# Patient Record
Sex: Female | Born: 1945 | Race: Black or African American | Hispanic: No | Smoking: Former smoker
Health system: Southern US, Community
[De-identification: ages and names within clinical notes are randomized; demographics above are authoritative.]

## PROBLEM LIST (undated history)

## (undated) DIAGNOSIS — M1990 Unspecified osteoarthritis, unspecified site: Secondary | ICD-10-CM

## (undated) DIAGNOSIS — K750 Abscess of liver: Principal | ICD-10-CM

## (undated) DIAGNOSIS — G629 Polyneuropathy, unspecified: Secondary | ICD-10-CM

## (undated) DIAGNOSIS — F419 Anxiety disorder, unspecified: Secondary | ICD-10-CM

## (undated) DIAGNOSIS — C259 Malignant neoplasm of pancreas, unspecified: Secondary | ICD-10-CM

## (undated) DIAGNOSIS — T451X5A Adverse effect of antineoplastic and immunosuppressive drugs, initial encounter: Secondary | ICD-10-CM

## (undated) DIAGNOSIS — I10 Essential (primary) hypertension: Secondary | ICD-10-CM

## (undated) DIAGNOSIS — E119 Type 2 diabetes mellitus without complications: Secondary | ICD-10-CM

## (undated) DIAGNOSIS — D6481 Anemia due to antineoplastic chemotherapy: Secondary | ICD-10-CM

## (undated) HISTORY — PX: ABDOMINAL HYSTERECTOMY: SHX81

## (undated) HISTORY — DX: Adverse effect of antineoplastic and immunosuppressive drugs, initial encounter: T45.1X5A

## (undated) HISTORY — DX: Anemia due to antineoplastic chemotherapy: D64.81

## (undated) HISTORY — DX: Malignant neoplasm of pancreas, unspecified: C25.9

## (undated) HISTORY — DX: Abscess of liver: K75.0

## (undated) HISTORY — DX: Polyneuropathy, unspecified: G62.9

---

## 2004-09-13 ENCOUNTER — Ambulatory Visit (HOSPITAL_COMMUNITY): Admission: RE | Admit: 2004-09-13 | Discharge: 2004-09-13 | Payer: Self-pay | Admitting: Family Medicine

## 2004-11-27 HISTORY — PX: COLONOSCOPY: SHX174

## 2005-09-18 ENCOUNTER — Ambulatory Visit (HOSPITAL_COMMUNITY): Admission: RE | Admit: 2005-09-18 | Discharge: 2005-09-18 | Payer: Self-pay | Admitting: Family Medicine

## 2005-10-13 ENCOUNTER — Encounter: Payer: Self-pay | Admitting: Internal Medicine

## 2005-10-13 ENCOUNTER — Ambulatory Visit (HOSPITAL_COMMUNITY): Admission: RE | Admit: 2005-10-13 | Discharge: 2005-10-13 | Payer: Self-pay | Admitting: Internal Medicine

## 2005-10-13 ENCOUNTER — Ambulatory Visit: Payer: Self-pay | Admitting: Internal Medicine

## 2006-12-21 ENCOUNTER — Ambulatory Visit (HOSPITAL_COMMUNITY): Admission: RE | Admit: 2006-12-21 | Discharge: 2006-12-21 | Payer: Self-pay | Admitting: Family Medicine

## 2006-12-31 ENCOUNTER — Encounter (HOSPITAL_COMMUNITY): Admission: RE | Admit: 2006-12-31 | Discharge: 2007-01-30 | Payer: Self-pay | Admitting: Family Medicine

## 2007-03-24 ENCOUNTER — Inpatient Hospital Stay (HOSPITAL_COMMUNITY): Admission: EM | Admit: 2007-03-24 | Discharge: 2007-03-27 | Payer: Self-pay | Admitting: Emergency Medicine

## 2007-05-28 ENCOUNTER — Emergency Department (HOSPITAL_COMMUNITY): Admission: EM | Admit: 2007-05-28 | Discharge: 2007-05-28 | Payer: Self-pay | Admitting: Emergency Medicine

## 2007-06-07 ENCOUNTER — Emergency Department (HOSPITAL_COMMUNITY): Admission: EM | Admit: 2007-06-07 | Discharge: 2007-06-07 | Payer: Self-pay | Admitting: Emergency Medicine

## 2007-07-31 ENCOUNTER — Ambulatory Visit (HOSPITAL_COMMUNITY): Admission: RE | Admit: 2007-07-31 | Discharge: 2007-07-31 | Payer: Self-pay | Admitting: Family Medicine

## 2007-11-06 ENCOUNTER — Ambulatory Visit (HOSPITAL_COMMUNITY): Admission: RE | Admit: 2007-11-06 | Discharge: 2007-11-06 | Payer: Self-pay | Admitting: Family Medicine

## 2007-11-21 ENCOUNTER — Emergency Department (HOSPITAL_COMMUNITY): Admission: EM | Admit: 2007-11-21 | Discharge: 2007-11-21 | Payer: Self-pay | Admitting: Emergency Medicine

## 2008-02-06 ENCOUNTER — Ambulatory Visit (HOSPITAL_COMMUNITY): Admission: RE | Admit: 2008-02-06 | Discharge: 2008-02-06 | Payer: Self-pay | Admitting: Family Medicine

## 2008-05-11 ENCOUNTER — Ambulatory Visit (HOSPITAL_COMMUNITY): Admission: RE | Admit: 2008-05-11 | Discharge: 2008-05-11 | Payer: Self-pay | Admitting: Family Medicine

## 2008-05-25 ENCOUNTER — Encounter (INDEPENDENT_AMBULATORY_CARE_PROVIDER_SITE_OTHER): Payer: Self-pay | Admitting: General Surgery

## 2008-05-25 ENCOUNTER — Ambulatory Visit (HOSPITAL_COMMUNITY): Admission: RE | Admit: 2008-05-25 | Discharge: 2008-05-25 | Payer: Self-pay | Admitting: General Surgery

## 2008-12-02 ENCOUNTER — Ambulatory Visit (HOSPITAL_COMMUNITY): Admission: RE | Admit: 2008-12-02 | Discharge: 2008-12-02 | Payer: Self-pay | Admitting: Family Medicine

## 2008-12-09 ENCOUNTER — Ambulatory Visit (HOSPITAL_COMMUNITY): Admission: RE | Admit: 2008-12-09 | Discharge: 2008-12-09 | Payer: Self-pay | Admitting: Family Medicine

## 2009-07-15 ENCOUNTER — Ambulatory Visit (HOSPITAL_COMMUNITY): Admission: RE | Admit: 2009-07-15 | Discharge: 2009-07-15 | Payer: Self-pay | Admitting: General Surgery

## 2011-01-04 ENCOUNTER — Other Ambulatory Visit (HOSPITAL_COMMUNITY): Payer: Self-pay | Admitting: Family Medicine

## 2011-01-04 DIAGNOSIS — Z139 Encounter for screening, unspecified: Secondary | ICD-10-CM

## 2011-01-09 ENCOUNTER — Ambulatory Visit (HOSPITAL_COMMUNITY)
Admission: RE | Admit: 2011-01-09 | Discharge: 2011-01-09 | Disposition: A | Discharge status: Home / Self Care | Payer: Medicare Other | Source: Ambulatory Visit | Attending: Family Medicine | Admitting: Family Medicine

## 2011-01-09 DIAGNOSIS — Z139 Encounter for screening, unspecified: Secondary | ICD-10-CM

## 2011-01-09 DIAGNOSIS — Z1231 Encounter for screening mammogram for malignant neoplasm of breast: Secondary | ICD-10-CM | POA: Insufficient documentation

## 2011-04-11 NOTE — H&P (Signed)
NAMEJAZZMEN, [name]                ACCOUNT NO.:  [id number]   MEDICAL RECORD NO.:  [id number]         PATIENT TYPE:  PAMB   LOCATION:  DAY                           FACILITY:  [facility]   PHYSICIAN:  Dalia Heading, M.D.  DATE OF BIRTH:  1945/12/05   DATE OF ADMISSION:  DATE OF DISCHARGE:  LH                              HISTORY & PHYSICAL   CHIEF COMPLAINT:  Left nipple discharge.   HISTORY OF PRESENT ILLNESS:  The patient is a 65 year old black female  who is referred for evaluation and treatment of milky left nipple  discharge.  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  There is no family history of breast carcinoma.  She has never had a  biopsy.   PAST MEDICAL HISTORY:  Includes hypertension and non-insulin-dependent  diabetes mellitus.   PAST SURGICAL HISTORY:  Partial hysterectomy in 1989.   CURRENT MEDICATIONS:  Cozaar, simvastatin, Actos, potassium supplements,  enalapril, cholesterol pill, hydrochlorothiazide, diazepam, and baby  aspirin.   ALLERGIES:  No known drug allergies.   SOCIAL HISTORY:  The patient smokes a half pack of cigarettes per day.  She denies any alcohol use.   REVIEW OF SYSTEMS:  She denies any recent chest pain, MI, CVA, or  bleeding disorders.   PHYSICAL EXAMINATION:  GENERAL:  The patient is a well-developed, well-  nourished black female, in no acute distress.  LUNGS:  Clear to auscultation with equal breath sounds bilaterally.  HEART:  Regular, rate, and rhythm with without S3, S4, or murmurs.  BREASTS:  Left breast examination reveals a milky discharge from the  central duct.  No dominant mass or dimpling is noted.  The axilla is  negative for palpable nodes.  Right breast examination is unremarkable.   IMPRESSION:  Left nipple discharge.   PLAN:  The patient is scheduled for left breast biopsy on May 25, 2008.  The risks and benefits of the procedure including bleeding and infection  were fully explained to the patient,  gave informed consent.      Dalia Heading, M.D.  Electronically Signed     MAJ/MEDQ  D:  05/19/2008  T:  05/20/2008  Job:  [id number]

## 2011-04-11 NOTE — Op Note (Signed)
NAMEEMBERLIN, VERNER                ACCOUNT NO.:  [id number]   MEDICAL RECORD NO.:  [id number]          PATIENT TYPE:  AMB   LOCATION:  DAY                           FACILITY:  [facility]   PHYSICIAN:  Dalia Heading, M.D.  DATE OF BIRTH:  [birth year]   DATE OF PROCEDURE:  05/25/2008  DATE OF DISCHARGE:                               OPERATIVE REPORT   PREOPERATIVE DIAGNOSIS:  Left nipple discharge.   POSTOPERATIVE DIAGNOSIS:  Left nipple discharge.   PROCEDURE:  Left breast biopsy.   SURGEON:  Dalia Heading, MD   ANESTHESIA:  General   INDICATIONS:  The patient is a 65 year old black female who presents  with milky left nipple discharge.  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  Risks and benefits of the procedure including  bleeding, infection, and recurrence of the nipple discharge were fully  explained to the patient, gave informed consent.   PROCEDURE NOTE:  The patient was placed in supine position.  After  general anesthesia was administered, the left breast was prepped and  draped in the usual sterile technique with Betadine.  Surgical site  confirmation was performed.   The ductal with the discharge was probed using lacrimal duct probe.  An  infra-areolar incision was made.  The tissue posterior to the nipple  where the ductal was present, it was excised without difficulty.  It was  sent to pathology further examination.  Any bleeding was controlled  using Bovie electrocautery.  The subcutaneous layer was reapproximated  using a 4-0 Vicryl interrupted suture.  The skin was closed using a 4-0  Vicryl subcuticular suture.  A 0.5 mL Sensorcaine was instilled in the  surrounding wound.  Dermabond was then applied.   All tape and needle counts were correct at the end of the procedure.  The patient was awakened and transferred to PACU in stable condition.   COMPLICATIONS:  None.   SPECIMEN:  Left breast biopsy.   ESTIMATED BLOOD LOSS:  Minimal.      Dalia Heading, M.D.  Electronically Signed     MAJ/MEDQ  D:  05/25/2008  T:  05/25/2008  Job:  [id number]   cc:   [facility] Department

## 2011-04-14 NOTE — Group Therapy Note (Signed)
[name], [name]                ACCOUNT NO.:  [id number]   MEDICAL RECORD NO.:  [id number]          PATIENT TYPE:  INP   LOCATION:  A203                          FACILITY:  [facility]   PHYSICIAN:  Mila Homer. [name], M.D.DATE OF BIRTH:  1946/05/05   DATE OF PROCEDURE:  DATE OF DISCHARGE:                                 PROGRESS NOTE   SUBJECTIVE:  She feels better today.  Revision is improved.   OBJECTIVE:  Temperature 97.9, pulse 85, respiratory rate is  20, blood  pressure 120/91.  She is moving her on the room in no acute distress.  Well-developed and morbidly obese.  Her sensorium is normal.  Sentence  structures are intact.  There is no slurring of her speech.  She has  good strength in all arms and legs.  The heart has a regular rhythm rate  of about 80. The lungs are clear throughout, moving air well.  There is  trace edema of the ankles.  O2 sat room air.  99%.   MRI the brain yesterday showed mild chronic ischemic changes but no  infarct.  The questionable area of infarct noted on the CT of the head  the day before turned out to be an artifact.  Her urine culture is  pending.   ASSESSMENT:  1. Neurological changes secondary to hyperglycemia.  2. Uncontrolled type 2 diabetes.  3. Presumptive urinary tract infection.  4. Morbid obesity.   PLAN:  Continue with Cipro 500 mg b.i.d. and if the sugars are stable  and vision is  back to normal and we are treating the right bacterium,  We will discharge tomorrow on the same.      Mila Homer. [name], M.D.  Electronically Signed     SDK/MEDQ  D:  03/26/2007  T:  03/26/2007  Job:  [id number]

## 2011-04-14 NOTE — Discharge Summary (Signed)
[name], [name]                ACCOUNT NO.:  [id number]   MEDICAL RECORD NO.:  [id number]          PATIENT TYPE:  INP   LOCATION:  A203                          FACILITY:  [facility]   PHYSICIAN:  Mila Homer. [name], M.D.DATE OF BIRTH:  [birth year]   DATE OF ADMISSION:  03/24/2007  DATE OF DISCHARGE:  LH                               DISCHARGE SUMMARY   This 65 year old woman was admitted to the hospital with confusion. She  had a benign 3-day hospitalization between March 25, 2007 to March 27, 2007.  Vital signs remained stable.   Her admission white cell count was 7,700, H and H 15.3 and 37.7.  BMP  showed glucose of 268.  Estimated PFR was greater than 60.  UA showed  specific gravity greater than 1.030 and urine glucose greater than 1000,  it was moderate for leukocytes and there were 21-50 WBCs, 21-50 RBCs,  and many bacteria per HPF.  Urine culture at the time of discharge is  still pending.   She had a CT of the head without contrast, which showed questionable  acute or subacute infarct in the left posterior mid brain.  She then had  MR of the brain which just showed mild plaque __________ changes and no  acute abnormalities.  The lesion noted on the CT of the brain was  thought to be an artifact.   She was admitted to the hospital.  She was put on vital signs every  shift and 1800 calorie ADA diet, neuro checks every 4 hours, ASA 325 mg  daily and put on sensitive sliding scale insulin and Cipro 500 mg b.i.d.  She was also continued on Actos 45 mg daily, Januvia 100 mg daily,  Cozaar 100 mg daily, enalapril 20 mg daily, hydrochlorothiazide 50 mg  daily, and Lovenox subcutaneously.   She did well on this regimen, feeling much better except for a little  bit of visual blurring which she still had her third day but she was  ready at that point for discharge home.   DISCHARGE DIAGNOSES:  1. Presumptive urinary tract infection.  2. Neurological changes secondary to  infection.  3. Uncontrolled type 2 diabetes.  4. Obesity.  5. Benign essential hypertension.  6. Abnormal brain CT.  7. Tobacco use disorder.   She is discharged home on Januvia 100 mg daily, Actos 45 mg daily,  hydrochlorothiazide 50 mg daily, Cozaar 100 mg daily, enalapril 20 mg  daily, p.r.n. diazepam. She will also be on Cipro 500 mg b.i.d. for 10  days (20, no refills).  She is to check her sugar t.i.d.  Follow up in  the office in 2 days. She is to go back to her exercise  regimen of  daily walking, cut way back on her food as we have discussed in detail.  She is to stop cigarette smoking.      Mila Homer. [name], M.D.  Electronically Signed     SDK/MEDQ  D:  03/27/2007  T:  03/27/2007  Job:  [id number]

## 2011-04-14 NOTE — H&P (Signed)
NAMEANGENI, [name]                ACCOUNT NO.:  [id number]   MEDICAL RECORD NO.:  [id number]          PATIENT TYPE:  INP   LOCATION:  A203                          FACILITY:  [facility]   PHYSICIAN:  Mila Homer. [name], M.D.DATE OF BIRTH:  [birth year]   DATE OF ADMISSION:  03/24/2007  DATE OF DISCHARGE:  LH                              HISTORY & PHYSICAL   This 65 year old woman developed increasing weakness associated with  polyuria, nocturia, and occasional chills starting about 5 days prior to  admission.  On the day of admission, she felt like she was going to  black out, felt dizzy, and came to the ER for evaluation.   Her long-time  and current medical problems include type 2 diabetes,  benign essential hypertension, and morbid obesity.  Recently she has  been exercising more to try to combat the obesity.   CURRENT MEDICATIONS:  1. Januvia 100 mg daily.  2. Actos 45 mg daily.  3. Hydrochlorothiazide 50 mg daily.  4. Cozaar 100 mg daily.  5. Enalapril 20 mg daily.  6. Diazepam 10 mg p.r.n. which she uses rarely.   REVIEW OF SYSTEMS:  The patient had a negative Review of Systems except  she did have the polyuria and nocturia.  She denied dysuria, fever,  nausea or vomiting.  GU was reviewed and was normal as was the GI,  cardiac, pulmonary, and neurological review except for the dizziness as  mentioned above.  She had no paralysis and no lateralizing weakness  noted.   PHYSICAL EXAMINATION:  VITAL SIGNS:  Today, the temperature is 98.1,  pulse 70, respiratory rate 20, blood pressure 156/83.  GENERAL:  She is oriented and alert.  She is supine in bed.  She is  morbidly obese.  No acute distress.  Skin turgor is normal.  Mucous  membranes are moist.  She is a good historian.  Sentence structure is  intact.  HEART:  Regular rhythm with rate of 70.  LUNGS:  Clear throughout, moving air well.  ADENOPATHY:  There is no axillary or supraclavicular adenopathy.  ABDOMEN:  Soft  without hepatosplenomegaly or mass or tenderness even in  the suprapubic region.  She has no CVA or flank pain.  EXTREMITIES:  She has just trace edema of the distal legs.   Admission CBC was normal.  MET-7 showed a glucose of 268.  Admission  urine was cloudy with a specific gravity greater than 1.030 and glucose  greater than 1000.  She had moderate leukocytes and, under the  microscope, 21-50 wbc's, 21-50 rbc's and many bacteria per high-power  field.   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   ADMISSION DIAGNOSES:  1. Neurological changes of dizziness and weakness probably secondary      to urinary tract infection.  2. Poorly controlled type 2 diabetes.  3. Benign essential hypertension.  4. Urinary tract infection.  5. Morbid obesity  6. Abnormal brain CT.   PLAN:  She has already had MRI of the brain this morning, and the  results are pending.  She is on Cipro 500 mg b.i.d..  Urine C&S is  likewise pending.  We will continue her on the Januvia, Actos,  hydrochlorothiazide, Cozaar enalapril as already used along with  sensitive sliding scale insulin.  I have discussed all this with the  patient and family.      Mila Homer. [name], M.D.  Electronically Signed     SDK/MEDQ  D:  03/25/2007  T:  03/25/2007  Job:  [id number]

## 2011-04-14 NOTE — Op Note (Signed)
NAME:  [name], [name]                ACCOUNT NO.:  [id number]   MEDICAL RECORD NO.:  [id number]          PATIENT TYPE:  AMB   LOCATION:  DAY                           FACILITY:  [facility]   PHYSICIAN:  R. Roetta Sessions, M.D. DATE OF BIRTH:  1946/02/02   DATE OF PROCEDURE:  10/13/2005  DATE OF DISCHARGE:                                 OPERATIVE REPORT   PROCEDURE:  Colonoscopy with biopsy.   INDICATIONS FOR PROCEDURE:  The patient is a 65 year old lady devoid of any  lower GI tract symptoms sent over at the courtesy of Dr. [name] for  colorectal cancer screening. She has never had her lower GI tract imaged.  There is no family history of colorectal neoplasia. Colonoscopy is now being  done. This approach has been discussed with the patient at length. Potential  risks, benefits, and alternatives have been reviewed and questions answered.  She is agreeable. Please see documentation in the medical record.   PROCEDURE NOTE:  O2 saturation, blood pressure, pulse, and respirations were  monitored throughout the entire procedure. Conscious sedation with Versed 4  mg IV and Demerol 100 mg IV in divided doses.   INSTRUMENT:  Olympus video chip system.   FINDINGS:  Digital rectal exam revealed no abnormalities.   ENDOSCOPIC FINDINGS:  Prep was good.   Rectum:  Examination of the rectal mucosa including retroflexed view of the  anal verge revealed no abnormalities.   Colon:  Colonic mucosa was surveyed from the rectosigmoid junction through  the left, transverse, and right colon to the area of the appendiceal  orifice, ileocecal valve, and cecum. These structures were well seen and  photographed for the record. From this level, the scope was slowly  withdrawn, and all previously mentioned mucosal surfaces were again seen.  The colonic mucosa appeared normal aside from 3-mm polyp, and the splenic  flexure was cold biopsied/removed. The patient tolerated the procedure well  and was reactive to  endoscopy.   IMPRESSION:  Normal rectum. Diminutive polyp at the splenic flexure, cold  biopsied/removed. The remainder of the colonic mucosa appeared normal.   RECOMMENDATIONS:  1.  Follow up on pathology.  2.  Further recommendations to follow.      Jonathon Bellows, M.D.  Electronically Signed     RMR/MEDQ  D:  10/13/2005  T:  10/13/2005  Job:  [id number]   cc:   Mila Homer. [name], M.D.  Fax: [phone]

## 2011-07-03 ENCOUNTER — Other Ambulatory Visit (HOSPITAL_COMMUNITY): Payer: Self-pay | Admitting: Family Medicine

## 2011-07-03 ENCOUNTER — Ambulatory Visit (HOSPITAL_COMMUNITY)
Admission: RE | Admit: 2011-07-03 | Discharge: 2011-07-03 | Disposition: A | Discharge status: Home / Self Care | Payer: Medicare Other | Source: Ambulatory Visit | Attending: Family Medicine | Admitting: Family Medicine

## 2011-07-03 DIAGNOSIS — M25562 Pain in left knee: Secondary | ICD-10-CM

## 2011-07-03 DIAGNOSIS — IMO0002 Reserved for concepts with insufficient information to code with codable children: Secondary | ICD-10-CM | POA: Insufficient documentation

## 2011-07-03 DIAGNOSIS — M1710 Unilateral primary osteoarthritis, unspecified knee: Secondary | ICD-10-CM | POA: Insufficient documentation

## 2011-07-03 DIAGNOSIS — M25569 Pain in unspecified knee: Secondary | ICD-10-CM | POA: Insufficient documentation

## 2011-08-24 LAB — BASIC METABOLIC PANEL
BUN: 14
CO2: 29
Calcium: 9.6
Chloride: 105
Creatinine, Ser: 1.13
GFR calc Af Amer: 60 — ABNORMAL LOW
GFR calc non Af Amer: 49 — ABNORMAL LOW
Glucose, Bld: 96
Potassium: 3.6
Sodium: 141

## 2011-08-24 LAB — CBC
HCT: 36.6
Hemoglobin: 12.9
MCHC: 35.1
MCV: 87.2
Platelets: 224
RBC: 4.2
RDW: 14.9
WBC: 5.2

## 2011-09-12 LAB — BASIC METABOLIC PANEL
BUN: 12
Calcium: 9.6
Creatinine, Ser: 1.39 — ABNORMAL HIGH
GFR calc non Af Amer: 39 — ABNORMAL LOW

## 2011-09-12 LAB — URINE MICROSCOPIC-ADD ON

## 2011-09-12 LAB — CBC
Platelets: 283
WBC: 8.4

## 2011-09-12 LAB — URINALYSIS, ROUTINE W REFLEX MICROSCOPIC
Glucose, UA: NEGATIVE
Protein, ur: NEGATIVE
Specific Gravity, Urine: 1.025

## 2011-09-12 LAB — DIFFERENTIAL
Basophils Relative: 0
Lymphocytes Relative: 21
Lymphs Abs: 1.8
Neutrophils Relative %: 70

## 2012-01-08 ENCOUNTER — Other Ambulatory Visit (HOSPITAL_COMMUNITY): Payer: Self-pay | Admitting: Family Medicine

## 2012-01-08 DIAGNOSIS — Z139 Encounter for screening, unspecified: Secondary | ICD-10-CM

## 2012-01-12 ENCOUNTER — Ambulatory Visit (HOSPITAL_COMMUNITY)
Admission: RE | Admit: 2012-01-12 | Discharge: 2012-01-12 | Disposition: A | Discharge status: Home / Self Care | Payer: Medicare Other | Source: Ambulatory Visit | Attending: Family Medicine | Admitting: Family Medicine

## 2012-01-12 DIAGNOSIS — Z1231 Encounter for screening mammogram for malignant neoplasm of breast: Secondary | ICD-10-CM | POA: Insufficient documentation

## 2012-01-12 DIAGNOSIS — Z139 Encounter for screening, unspecified: Secondary | ICD-10-CM

## 2012-12-10 ENCOUNTER — Other Ambulatory Visit (HOSPITAL_COMMUNITY): Payer: Self-pay | Admitting: Family Medicine

## 2012-12-10 DIAGNOSIS — Z139 Encounter for screening, unspecified: Secondary | ICD-10-CM

## 2013-01-13 ENCOUNTER — Ambulatory Visit (HOSPITAL_COMMUNITY)
Admission: RE | Admit: 2013-01-13 | Discharge: 2013-01-13 | Disposition: A | Discharge status: Home / Self Care | Payer: Medicare Other | Source: Ambulatory Visit | Attending: Family Medicine | Admitting: Family Medicine

## 2013-01-13 DIAGNOSIS — Z1231 Encounter for screening mammogram for malignant neoplasm of breast: Secondary | ICD-10-CM | POA: Insufficient documentation

## 2013-01-13 DIAGNOSIS — Z139 Encounter for screening, unspecified: Secondary | ICD-10-CM

## 2013-05-03 ENCOUNTER — Emergency Department (HOSPITAL_COMMUNITY)
Admission: EM | Admit: 2013-05-03 | Discharge: 2013-05-04 | Disposition: A | Discharge status: Home / Self Care | Payer: Medicare Other | Attending: Emergency Medicine | Admitting: Emergency Medicine

## 2013-05-03 ENCOUNTER — Encounter (HOSPITAL_COMMUNITY): Payer: Self-pay | Admitting: *Deleted

## 2013-05-03 DIAGNOSIS — R739 Hyperglycemia, unspecified: Secondary | ICD-10-CM

## 2013-05-03 DIAGNOSIS — R109 Unspecified abdominal pain: Secondary | ICD-10-CM

## 2013-05-03 DIAGNOSIS — R5381 Other malaise: Secondary | ICD-10-CM | POA: Insufficient documentation

## 2013-05-03 DIAGNOSIS — M129 Arthropathy, unspecified: Secondary | ICD-10-CM | POA: Insufficient documentation

## 2013-05-03 DIAGNOSIS — I10 Essential (primary) hypertension: Secondary | ICD-10-CM | POA: Insufficient documentation

## 2013-05-03 DIAGNOSIS — Z90710 Acquired absence of both cervix and uterus: Secondary | ICD-10-CM | POA: Insufficient documentation

## 2013-05-03 DIAGNOSIS — E1169 Type 2 diabetes mellitus with other specified complication: Secondary | ICD-10-CM | POA: Insufficient documentation

## 2013-05-03 DIAGNOSIS — N390 Urinary tract infection, site not specified: Secondary | ICD-10-CM | POA: Insufficient documentation

## 2013-05-03 DIAGNOSIS — R358 Other polyuria: Secondary | ICD-10-CM | POA: Insufficient documentation

## 2013-05-03 DIAGNOSIS — Z79899 Other long term (current) drug therapy: Secondary | ICD-10-CM | POA: Insufficient documentation

## 2013-05-03 DIAGNOSIS — R350 Frequency of micturition: Secondary | ICD-10-CM | POA: Insufficient documentation

## 2013-05-03 DIAGNOSIS — R197 Diarrhea, unspecified: Secondary | ICD-10-CM

## 2013-05-03 DIAGNOSIS — R300 Dysuria: Secondary | ICD-10-CM | POA: Insufficient documentation

## 2013-05-03 DIAGNOSIS — R5383 Other fatigue: Secondary | ICD-10-CM | POA: Insufficient documentation

## 2013-05-03 DIAGNOSIS — Z7982 Long term (current) use of aspirin: Secondary | ICD-10-CM | POA: Insufficient documentation

## 2013-05-03 DIAGNOSIS — F17200 Nicotine dependence, unspecified, uncomplicated: Secondary | ICD-10-CM | POA: Insufficient documentation

## 2013-05-03 DIAGNOSIS — F411 Generalized anxiety disorder: Secondary | ICD-10-CM | POA: Insufficient documentation

## 2013-05-03 DIAGNOSIS — R3589 Other polyuria: Secondary | ICD-10-CM | POA: Insufficient documentation

## 2013-05-03 HISTORY — DX: Type 2 diabetes mellitus without complications: E11.9

## 2013-05-03 HISTORY — DX: Unspecified osteoarthritis, unspecified site: M19.90

## 2013-05-03 HISTORY — DX: Anxiety disorder, unspecified: F41.9

## 2013-05-03 HISTORY — DX: Essential (primary) hypertension: I10

## 2013-05-03 NOTE — ED Notes (Signed)
Pt reporting pain in lower abdomen.  Reporting urinary urgency, frequency and burning.  Believes she has a UTI.  Reporting symptoms began on Monday.

## 2013-05-04 LAB — URINALYSIS, ROUTINE W REFLEX MICROSCOPIC
Ketones, ur: NEGATIVE mg/dL
Specific Gravity, Urine: 1.01 (ref 1.005–1.030)
Urobilinogen, UA: 0.2 mg/dL (ref 0.0–1.0)

## 2013-05-04 LAB — POCT I-STAT, CHEM 8
Chloride: 96 mEq/L (ref 96–112)
Creatinine, Ser: 1.2 mg/dL — ABNORMAL HIGH (ref 0.50–1.10)
HCT: 40 % (ref 36.0–46.0)
Hemoglobin: 13.6 g/dL (ref 12.0–15.0)
Potassium: 3.3 mEq/L — ABNORMAL LOW (ref 3.5–5.1)
Sodium: 136 mEq/L (ref 135–145)

## 2013-05-04 LAB — URINE MICROSCOPIC-ADD ON

## 2013-05-04 MED ORDER — OXYCODONE-ACETAMINOPHEN 5-325 MG PO TABS: 1.0000 | ORAL_TABLET | ORAL | Status: DC | PRN

## 2013-05-04 MED ORDER — METFORMIN HCL 500 MG PO TABS: 500.0000 mg | ORAL_TABLET | Freq: Every day | ORAL | Status: DC

## 2013-05-04 MED ORDER — OXYCODONE-ACETAMINOPHEN 5-325 MG PO TABS
1.0000 | ORAL_TABLET | Freq: Once | ORAL | Status: AC
  Administered 2013-05-04: 1 via ORAL
  Filled 2013-05-04 (×2): qty 1

## 2013-05-04 NOTE — ED Provider Notes (Signed)
History     CSN: [id number]  Arrival date & time 05/03/13  2313   First MD Initiated Contact with Patient 05/03/13 2357      Chief Complaint  Patient presents with  . Abdominal Pain  . Urinary Tract Infection     Patient is a 67 y.o. female presenting with abdominal pain. The history is provided by the patient.  Abdominal Pain This is a new problem. The current episode started more than 2 days ago. The problem occurs daily. The problem has not changed since onset.Associated symptoms include abdominal pain. Pertinent negatives include no chest pain, no headaches and no shortness of breath. Nothing aggravates the symptoms. Nothing relieves the symptoms. The treatment provided mild relief.  pt reports she has had mild lower abdominal pain for up to 5 days She reports loose nonbloody diarrhea No vomiting After several episodes of the diarrhea she noticed mild dysuria.  She also reports recent urinary frequency She thinks she has UTI She reports mild fatigue but no cp/sob.  No focal weakness No fever reported  Past Medical History  Diagnosis Date  . Diabetes mellitus without complication   . Hypertension   . Anxiety   . Arthritis     Past Surgical History  Procedure Laterality Date  . Abdominal hysterectomy      History reviewed. No pertinent family history.  History  Substance Use Topics  . Smoking status: Current Every Day Smoker -- 1.00 packs/day  . Smokeless tobacco: Not on file  . Alcohol Use: No    OB History   Grav Para Term Preterm Abortions TAB SAB Ect Mult Living                  Review of Systems  Constitutional: Positive for fatigue.  Respiratory: Negative for shortness of breath.   Cardiovascular: Negative for chest pain.  Gastrointestinal: Positive for abdominal pain.  Endocrine: Positive for polyuria.  Genitourinary: Positive for dysuria.  Neurological: Negative for headaches.  All other systems reviewed and are negative.    Allergies   Review of patient's allergies indicates no known allergies.  Home Medications   Current Outpatient Rx  Name  Route  Sig  Dispense  Refill  . ALPRAZolam (XANAX) 1 MG tablet   Oral   Take 1 mg by mouth 3 (three) times daily as needed for sleep or anxiety.         Marland Kitchen amLODipine (NORVASC) 2.5 MG tablet   Oral   Take 2.5 mg by mouth daily.         Marland Kitchen aspirin 81 MG tablet   Oral   Take 81 mg by mouth daily.         . hydrochlorothiazide (HYDRODIURIL) 25 MG tablet   Oral   Take 25 mg by mouth daily.         Marland Kitchen losartan (COZAAR) 100 MG tablet   Oral   Take 100 mg by mouth daily.         . potassium chloride (K-DUR,KLOR-CON) 10 MEQ tablet   Oral   Take 10 mEq by mouth 2 (two) times daily.         . simvastatin (ZOCOR) 20 MG tablet   Oral   Take 20 mg by mouth every evening.         . metFORMIN (GLUCOPHAGE) 500 MG tablet   Oral   Take 1 tablet (500 mg total) by mouth daily with breakfast.   14 tablet   0  BP 158/86  Pulse 96  Temp(Src) 99 F (37.2 C) (Oral)  Resp 18  Ht 5\' 6"  (1.676 m)  Wt 162 lb (73.483 kg)  BMI 26.16 kg/m2  SpO2 99%  Physical Exam CONSTITUTIONAL: Well developed/well nourished HEAD: Normocephalic/atraumatic EYES: EOMI/PERRL ENMT: Mucous membranes moist NECK: supple no meningeal signs SPINE:entire spine nontender CV: S1/S2 noted, no murmurs/rubs/gallops noted LUNGS: Lungs are clear to auscultation bilaterally, no apparent distress ABDOMEN: soft, nontender, no rebound or guarding GU:no cva tenderness NEURO: Pt is awake/alert, moves all extremitiesx4 EXTREMITIES: pulses normal, full ROM SKIN: warm, color normal PSYCH: no abnormalities of mood noted  ED Course  Procedures   Labs Reviewed  URINALYSIS, ROUTINE W REFLEX MICROSCOPIC - Abnormal; Notable for the following:    Glucose, UA >1000 (*)    Hgb urine dipstick TRACE (*)    Bilirubin Urine MODERATE (*)    All other components within normal limits  URINE MICROSCOPIC-ADD  ON - Abnormal; Notable for the following:    Squamous Epithelial / LPF FEW (*)    All other components within normal limits  POCT I-STAT, CHEM 8 - Abnormal; Notable for the following:    Potassium 3.3 (*)    Creatinine, Ser 1.20 (*)    Glucose, Bld 394 (*)    Calcium, Ion 1.11 (*)    All other components within normal limits   No results found.   1. Diarrhea   2. Abdominal pain   3. Hyperglycemia    Pt well appearing, no distress, watching TV Suspect her hyperglycemia has led to her polyuria.  She reports she is no longer on diabetic meds as her PCP took her off the meds.   No uti noted.  Mild hypoK noted but she is on potassium replacement for her HCTZ Her abdominal exam is benign.  I doubt acute abdominal process/diverticulitis/appendicitis at this time She reports she was taken off metformin just this past winter.  I will restart this and she reports she has tolerated this well She has no signs of DKA  Discussed need for outpatient followup  MDM  Nursing notes including past medical history and social history reviewed and considered in documentation Labs/vital reviewed and considered         Joya Gaskins, MD 05/04/13 [phone]

## 2013-05-07 ENCOUNTER — Emergency Department (HOSPITAL_COMMUNITY)
Admission: EM | Admit: 2013-05-07 | Discharge: 2013-05-07 | Discharge status: Against Medical Advice | Payer: Medicare Other | Source: Home / Self Care | Attending: Emergency Medicine | Admitting: Emergency Medicine

## 2013-05-07 ENCOUNTER — Encounter (HOSPITAL_COMMUNITY): Payer: Self-pay | Admitting: Emergency Medicine

## 2013-05-07 ENCOUNTER — Emergency Department (HOSPITAL_COMMUNITY): Payer: Medicare Other

## 2013-05-07 ENCOUNTER — Inpatient Hospital Stay (HOSPITAL_COMMUNITY)
Admission: EM | Admit: 2013-05-07 | Discharge: 2013-05-13 | DRG: 435 | Disposition: A | Discharge status: Home / Self Care | Payer: Medicare Other | Attending: Family Medicine | Admitting: Family Medicine

## 2013-05-07 DIAGNOSIS — E119 Type 2 diabetes mellitus without complications: Secondary | ICD-10-CM | POA: Diagnosis present

## 2013-05-07 DIAGNOSIS — C7800 Secondary malignant neoplasm of unspecified lung: Secondary | ICD-10-CM | POA: Diagnosis present

## 2013-05-07 DIAGNOSIS — K831 Obstruction of bile duct: Secondary | ICD-10-CM | POA: Diagnosis present

## 2013-05-07 DIAGNOSIS — L299 Pruritus, unspecified: Secondary | ICD-10-CM | POA: Insufficient documentation

## 2013-05-07 DIAGNOSIS — R109 Unspecified abdominal pain: Secondary | ICD-10-CM | POA: Insufficient documentation

## 2013-05-07 DIAGNOSIS — F411 Generalized anxiety disorder: Secondary | ICD-10-CM | POA: Diagnosis present

## 2013-05-07 DIAGNOSIS — H9319 Tinnitus, unspecified ear: Secondary | ICD-10-CM | POA: Insufficient documentation

## 2013-05-07 DIAGNOSIS — F17200 Nicotine dependence, unspecified, uncomplicated: Secondary | ICD-10-CM | POA: Insufficient documentation

## 2013-05-07 DIAGNOSIS — Z90710 Acquired absence of both cervix and uterus: Secondary | ICD-10-CM | POA: Insufficient documentation

## 2013-05-07 DIAGNOSIS — R822 Biliuria: Secondary | ICD-10-CM | POA: Insufficient documentation

## 2013-05-07 DIAGNOSIS — M129 Arthropathy, unspecified: Secondary | ICD-10-CM | POA: Insufficient documentation

## 2013-05-07 DIAGNOSIS — R17 Unspecified jaundice: Secondary | ICD-10-CM

## 2013-05-07 DIAGNOSIS — I10 Essential (primary) hypertension: Secondary | ICD-10-CM | POA: Insufficient documentation

## 2013-05-07 DIAGNOSIS — C786 Secondary malignant neoplasm of retroperitoneum and peritoneum: Secondary | ICD-10-CM | POA: Diagnosis present

## 2013-05-07 DIAGNOSIS — R300 Dysuria: Secondary | ICD-10-CM | POA: Insufficient documentation

## 2013-05-07 DIAGNOSIS — E876 Hypokalemia: Secondary | ICD-10-CM | POA: Diagnosis present

## 2013-05-07 DIAGNOSIS — C787 Secondary malignant neoplasm of liver and intrahepatic bile duct: Secondary | ICD-10-CM | POA: Diagnosis present

## 2013-05-07 DIAGNOSIS — C259 Malignant neoplasm of pancreas, unspecified: Principal | ICD-10-CM | POA: Diagnosis present

## 2013-05-07 DIAGNOSIS — E860 Dehydration: Secondary | ICD-10-CM | POA: Diagnosis present

## 2013-05-07 DIAGNOSIS — R630 Anorexia: Secondary | ICD-10-CM | POA: Insufficient documentation

## 2013-05-07 DIAGNOSIS — K838 Other specified diseases of biliary tract: Secondary | ICD-10-CM

## 2013-05-07 DIAGNOSIS — Z79899 Other long term (current) drug therapy: Secondary | ICD-10-CM | POA: Insufficient documentation

## 2013-05-07 DIAGNOSIS — Z720 Tobacco use: Secondary | ICD-10-CM | POA: Diagnosis present

## 2013-05-07 DIAGNOSIS — Z7982 Long term (current) use of aspirin: Secondary | ICD-10-CM | POA: Insufficient documentation

## 2013-05-07 DIAGNOSIS — R21 Rash and other nonspecific skin eruption: Secondary | ICD-10-CM | POA: Insufficient documentation

## 2013-05-07 DIAGNOSIS — Z808 Family history of malignant neoplasm of other organs or systems: Secondary | ICD-10-CM

## 2013-05-07 DIAGNOSIS — Z8249 Family history of ischemic heart disease and other diseases of the circulatory system: Secondary | ICD-10-CM

## 2013-05-07 DIAGNOSIS — R180 Malignant ascites: Secondary | ICD-10-CM | POA: Diagnosis present

## 2013-05-07 DIAGNOSIS — E1169 Type 2 diabetes mellitus with other specified complication: Secondary | ICD-10-CM | POA: Insufficient documentation

## 2013-05-07 LAB — URINALYSIS, ROUTINE W REFLEX MICROSCOPIC
Nitrite: NEGATIVE
Nitrite: NEGATIVE
Protein, ur: NEGATIVE mg/dL
Specific Gravity, Urine: 1.02 (ref 1.005–1.030)
Specific Gravity, Urine: 1.01 (ref 1.005–1.030)
Urobilinogen, UA: 0.2 mg/dL (ref 0.0–1.0)
Urobilinogen, UA: 0.2 mg/dL (ref 0.0–1.0)

## 2013-05-07 LAB — CBC WITH DIFFERENTIAL/PLATELET
Lymphocytes Relative: 15 % (ref 12–46)
Lymphs Abs: 1.6 10*3/uL (ref 0.7–4.0)
Neutro Abs: 8.4 10*3/uL — ABNORMAL HIGH (ref 1.7–7.7)
Neutrophils Relative %: 74 % (ref 43–77)
Platelets: 242 10*3/uL (ref 150–400)
RBC: 3.82 MIL/uL — ABNORMAL LOW (ref 3.87–5.11)
WBC: 11.3 10*3/uL — ABNORMAL HIGH (ref 4.0–10.5)

## 2013-05-07 LAB — COMPREHENSIVE METABOLIC PANEL
ALT: 123 U/L — ABNORMAL HIGH (ref 0–35)
Alkaline Phosphatase: 429 U/L — ABNORMAL HIGH (ref 39–117)
CO2: 25 mEq/L (ref 19–32)
Chloride: 95 mEq/L — ABNORMAL LOW (ref 96–112)
GFR calc Af Amer: 82 mL/min — ABNORMAL LOW (ref >90)
GFR calc non Af Amer: 71 mL/min — ABNORMAL LOW (ref >90)
Glucose, Bld: 356 mg/dL — ABNORMAL HIGH (ref 70–99)
Potassium: 3.2 mEq/L — ABNORMAL LOW (ref 3.5–5.1)
Sodium: 133 mEq/L — ABNORMAL LOW (ref 135–145)
Total Protein: 7.3 g/dL (ref 6.0–8.3)

## 2013-05-07 LAB — URINE MICROSCOPIC-ADD ON

## 2013-05-07 LAB — GLUCOSE, CAPILLARY: Glucose-Capillary: 311 mg/dL — ABNORMAL HIGH (ref 70–99)

## 2013-05-07 MED ORDER — ASPIRIN 81 MG PO CHEW
81.0000 mg | CHEWABLE_TABLET | Freq: Every day | ORAL | Status: DC
  Administered 2013-05-08 – 2013-05-09 (×2): 81 mg via ORAL
  Filled 2013-05-07 (×2): qty 1

## 2013-05-07 MED ORDER — HYDROCODONE-ACETAMINOPHEN 5-325 MG PO TABS
1.0000 | ORAL_TABLET | ORAL | Status: DC | PRN
Start: 2013-05-07 — End: 2013-05-13

## 2013-05-07 MED ORDER — ONDANSETRON HCL 4 MG/2ML IJ SOLN: 4.0000 mg | Freq: Four times a day (QID) | INTRAMUSCULAR | Status: DC | PRN

## 2013-05-07 MED ORDER — SIMVASTATIN 20 MG PO TABS
20.0000 mg | ORAL_TABLET | Freq: Every day | ORAL | Status: DC
  Administered 2013-05-07 – 2013-05-09 (×3): 20 mg via ORAL
  Filled 2013-05-07 (×3): qty 1

## 2013-05-07 MED ORDER — ENOXAPARIN SODIUM 40 MG/0.4ML ~~LOC~~ SOLN
40.0000 mg | SUBCUTANEOUS | Status: DC
  Administered 2013-05-07 – 2013-05-08 (×2): 40 mg via SUBCUTANEOUS
  Filled 2013-05-07 (×2): qty 0.4

## 2013-05-07 MED ORDER — POTASSIUM CHLORIDE IN NACL 20-0.9 MEQ/L-% IV SOLN
INTRAVENOUS | Status: DC
  Administered 2013-05-07 – 2013-05-13 (×10): via INTRAVENOUS

## 2013-05-07 MED ORDER — IOHEXOL 300 MG/ML  SOLN
50.0000 mL | Freq: Once | INTRAMUSCULAR | Status: AC | PRN
  Administered 2013-05-07: 50 mL via ORAL

## 2013-05-07 MED ORDER — ONDANSETRON HCL 4 MG PO TABS: 4.0000 mg | ORAL_TABLET | Freq: Four times a day (QID) | ORAL | Status: DC | PRN

## 2013-05-07 MED ORDER — MORPHINE SULFATE 2 MG/ML IJ SOLN: 2.0000 mg | INTRAMUSCULAR | Status: DC | PRN

## 2013-05-07 MED ORDER — DEXTROSE 5 % IV SOLN
INTRAVENOUS | Status: AC
  Filled 2013-05-07: qty 10

## 2013-05-07 MED ORDER — IOHEXOL 300 MG/ML  SOLN
100.0000 mL | Freq: Once | INTRAMUSCULAR | Status: AC | PRN
  Administered 2013-05-07: 100 mL via INTRAVENOUS

## 2013-05-07 MED ORDER — ACETAMINOPHEN 325 MG PO TABS: 650.0000 mg | ORAL_TABLET | Freq: Four times a day (QID) | ORAL | Status: DC | PRN

## 2013-05-07 MED ORDER — DEXTROSE 5 % IV SOLN
1.0000 g | INTRAVENOUS | Status: DC
  Administered 2013-05-07 – 2013-05-12 (×6): 1 g via INTRAVENOUS
  Filled 2013-05-07 (×6): qty 10

## 2013-05-07 MED ORDER — ALPRAZOLAM 1 MG PO TABS
1.0000 mg | ORAL_TABLET | Freq: Three times a day (TID) | ORAL | Status: DC | PRN
  Administered 2013-05-08 – 2013-05-12 (×4): 1 mg via ORAL
  Filled 2013-05-07 (×4): qty 1

## 2013-05-07 MED ORDER — SODIUM CHLORIDE 0.9 % IV BOLUS (SEPSIS)
1000.0000 mL | Freq: Once | INTRAVENOUS | Status: AC
  Administered 2013-05-07: 1000 mL via INTRAVENOUS

## 2013-05-07 MED ORDER — INSULIN ASPART 100 UNIT/ML ~~LOC~~ SOLN
0.0000 [IU] | Freq: Every day | SUBCUTANEOUS | Status: DC
  Administered 2013-05-07: 4 [IU] via SUBCUTANEOUS
  Administered 2013-05-08 – 2013-05-12 (×3): 2 [IU] via SUBCUTANEOUS

## 2013-05-07 MED ORDER — SODIUM CHLORIDE 0.9 % IJ SOLN
3.0000 mL | Freq: Two times a day (BID) | INTRAMUSCULAR | Status: DC
  Administered 2013-05-08 – 2013-05-12 (×2): 3 mL via INTRAVENOUS

## 2013-05-07 MED ORDER — AMLODIPINE BESYLATE 5 MG PO TABS
2.5000 mg | ORAL_TABLET | Freq: Every day | ORAL | Status: DC
  Administered 2013-05-08 – 2013-05-13 (×6): 2.5 mg via ORAL
  Filled 2013-05-07 (×6): qty 1

## 2013-05-07 MED ORDER — ACETAMINOPHEN 650 MG RE SUPP: 650.0000 mg | Freq: Four times a day (QID) | RECTAL | Status: DC | PRN

## 2013-05-07 MED ORDER — INSULIN ASPART 100 UNIT/ML ~~LOC~~ SOLN
0.0000 [IU] | Freq: Three times a day (TID) | SUBCUTANEOUS | Status: DC
  Administered 2013-05-08: 5 [IU] via SUBCUTANEOUS
  Administered 2013-05-08 (×2): 3 [IU] via SUBCUTANEOUS
  Administered 2013-05-09: 8 [IU] via SUBCUTANEOUS
  Administered 2013-05-09: 3 [IU] via SUBCUTANEOUS
  Administered 2013-05-09: 5 [IU] via SUBCUTANEOUS
  Administered 2013-05-10 (×2): 8 [IU] via SUBCUTANEOUS
  Administered 2013-05-11 – 2013-05-12 (×5): 3 [IU] via SUBCUTANEOUS
  Administered 2013-05-13: 8 [IU] via SUBCUTANEOUS

## 2013-05-07 MED ORDER — LOSARTAN POTASSIUM 50 MG PO TABS
100.0000 mg | ORAL_TABLET | Freq: Every day | ORAL | Status: DC
  Administered 2013-05-08 – 2013-05-13 (×6): 100 mg via ORAL
  Filled 2013-05-07 (×6): qty 2

## 2013-05-07 NOTE — ED Notes (Addendum)
Pt seen in ED earlier today for abd pain and diarrhea. Pt left AMA but agreed to return for admission.

## 2013-05-07 NOTE — ED Provider Notes (Signed)
History  This chart was scribed for Autumn Lennert, MD by [name], ED Scribe. This patient was seen in room [facility]03[facility]03 and the patient's care was started at 12:17 PM.  CSN: [id number]  Arrival date & time 05/07/13  1133   First MD Initiated Contact with Patient 05/07/13 1217      Chief Complaint  Patient presents with  . Fatigue  . Urinary Tract Infection     Patient is a 67 y.o. female presenting with weakness. The history is provided by the patient. No language interpreter was used.  Weakness This is a new problem. The current episode started more than 1 week ago. The problem occurs constantly. The problem has been gradually worsening. Associated symptoms include abdominal pain. Pertinent negatives include no chest pain, no headaches and no shortness of breath. Nothing aggravates the symptoms. Nothing relieves the symptoms. She has tried nothing for the symptoms.    HPI Comments: [name] is a 67 y.o. female with a h/o DM who presents to the Emergency Department complaining of 9 days of gradual onset, gradually worsening, constant generalized weakness with associated decreased appetite, dysuria, urine color change to orange, mild lower abdominal pain and generalized itching on back and abdomen. She was seen in the ED for the same on 05/03/13 (4 days ago) and had a negative work-up for an UTI and a negative CT scan of the abdomen. Her symptoms at that time were attributed to hyperglycemia and she was restarted on metformin which she had stopped in December 2013. No other medications were changed or prescribed. She also c/o right tinnitus but denies emesis, diarrhea and CP as associated symptoms. She has a h/o HTN and anxiety. Pt is a current everyday smoker but denies alcohol use.   Past Medical History  Diagnosis Date  . Diabetes mellitus without complication   . Hypertension   . Anxiety   . Arthritis     Past Surgical History  Procedure Laterality Date  . Abdominal  hysterectomy      No family history on file.  History  Substance Use Topics  . Smoking status: Current Every Day Smoker -- 1.00 packs/day  . Smokeless tobacco: Not on file  . Alcohol Use: No    No OB history provided.  Review of Systems  Constitutional: Positive for appetite change (decrease). Negative for chills and fatigue.  HENT: Positive for tinnitus. Negative for congestion, sinus pressure and ear discharge.   Eyes: Negative for discharge.  Respiratory: Negative for cough and shortness of breath.   Cardiovascular: Negative for chest pain.  Gastrointestinal: Positive for abdominal pain. Negative for nausea, vomiting and diarrhea.  Genitourinary: Positive for dysuria. Negative for frequency and hematuria.  Musculoskeletal: Negative for back pain.  Skin: Positive for rash.  Neurological: Positive for weakness. Negative for seizures and headaches.  Psychiatric/Behavioral: Negative for hallucinations.    Allergies  Review of patient's allergies indicates no known allergies.  Home Medications   Current Outpatient Rx  Name  Route  Sig  Dispense  Refill  . ALPRAZolam (XANAX) 1 MG tablet   Oral   Take 1 mg by mouth 3 (three) times daily as needed for sleep or anxiety.         Marland Kitchen amLODipine (NORVASC) 2.5 MG tablet   Oral   Take 2.5 mg by mouth daily.         Marland Kitchen aspirin 81 MG tablet   Oral   Take 81 mg by mouth daily.         Marland Kitchen  hydrochlorothiazide (HYDRODIURIL) 25 MG tablet   Oral   Take 25 mg by mouth daily.         Marland Kitchen losartan (COZAAR) 100 MG tablet   Oral   Take 100 mg by mouth daily.         . metFORMIN (GLUCOPHAGE) 500 MG tablet   Oral   Take 1 tablet (500 mg total) by mouth daily with breakfast.   14 tablet   0   . potassium chloride (K-DUR,KLOR-CON) 10 MEQ tablet   Oral   Take 10 mEq by mouth 2 (two) times daily.         . simvastatin (ZOCOR) 20 MG tablet   Oral   Take 20 mg by mouth every evening.           Triage Vitals: BP 169/96   Pulse 98  Temp(Src) 98.3 F (36.8 C) (Oral)  Resp 18  Ht 5\' 5"  (1.651 m)  Wt 162 lb (73.483 kg)  BMI 26.96 kg/m2  SpO2 96%  Physical Exam  Nursing note and vitals reviewed. Constitutional: She is oriented to person, place, and time. She appears well-developed and well-nourished.  HENT:  Head: Normocephalic and atraumatic.  Eyes: Conjunctivae and EOM are normal. Scleral icterus is present.  Neck: Neck supple. No thyromegaly present.  Cardiovascular: Normal rate and regular rhythm.  Exam reveals no gallop and no friction rub.   No murmur heard. Pulmonary/Chest: Effort normal and breath sounds normal. No stridor. She has no wheezes. She has no rales. She exhibits no tenderness.  Abdominal: She exhibits no distension. There is no tenderness. There is no rebound.  Musculoskeletal: Normal range of motion. She exhibits no edema.  Lymphadenopathy:    She has no cervical adenopathy.  Neurological: She is alert and oriented to person, place, and time. Coordination normal.  Skin: No rash noted. No erythema.  Fine rash to right upper back  Psychiatric: She has a normal mood and affect. Her behavior is normal.    ED Course  Procedures (including critical care time)  Medications  sodium chloride 0.9 % bolus 1,000 mL (not administered)    DIAGNOSTIC STUDIES: Oxygen Saturation is 96% on room air, normal by my interpretation.    COORDINATION OF CARE: 12:21 PM-Discussed treatment plan which includes IV fluids, CBC panel, CMP and UA with pt at bedside and pt agreed to plan.   2:07 PM-Pt rechecked and still complains of diffuse abdominal pain with medications listed above. Pt reiterates that she began feeling an "acidy" feeling diffusely in her abdomen 1.5 weeks ago. She reports intermittent nausea but denies emesis. Informed pt of lab results showing elevated liver enzymes. Discussed plan to order CT of abdomen with pt and pt agreed.  Labs Reviewed  URINALYSIS, ROUTINE W REFLEX MICROSCOPIC  - Abnormal; Notable for the following:    Color, Urine AMBER (*)    APPearance HAZY (*)    Glucose, UA >1000 (*)    Hgb urine dipstick TRACE (*)    Bilirubin Urine LARGE (*)    Protein, ur TRACE (*)    All other components within normal limits  CBC WITH DIFFERENTIAL - Abnormal; Notable for the following:    WBC 11.3 (*)    RBC 3.82 (*)    Hemoglobin 11.5 (*)    HCT 32.4 (*)    Neutro Abs 8.4 (*)    All other components within normal limits  COMPREHENSIVE METABOLIC PANEL - Abnormal; Notable for the following:    Sodium 133 (*)  Potassium 3.2 (*)    Chloride 95 (*)    Glucose, Bld 356 (*)    Albumin 2.8 (*)    AST 92 (*)    ALT 123 (*)    Alkaline Phosphatase 429 (*)    Total Bilirubin 14.6 (*)    All other components within normal limits  URINE MICROSCOPIC-ADD ON - Abnormal; Notable for the following:    Squamous Epithelial / LPF MANY (*)    Bacteria, UA MANY (*)    All other components within normal limits   No results found.   No diagnosis found.    MDM  Pt with stage 4 pancreatic ca.  She is going home for one hour and returning to er for admission .The chart was scribed for me under my direct supervision.  I personally performed the history, physical, and medical decision making and all procedures in the evaluation of this patient.Autumn Lennert, MD 05/07/13 1550

## 2013-05-07 NOTE — ED Notes (Signed)
Pt refusing to stay for admission right now.  Pt states that she has to go home.  Pt agreed with all risk by leaving.  edp aware.

## 2013-05-07 NOTE — ED Notes (Signed)
MD at bedside. 

## 2013-05-07 NOTE — ED Provider Notes (Signed)
History     CSN: [id number]  Arrival date & time 05/07/13  1659   First MD Initiated Contact with Patient 05/07/13 1708      Chief Complaint  Patient presents with  . Abdominal Pain     HPI Pt was seen at 1735.   Per pt, c/o gradual onset and persistence of constant generalized abd "pain" for the past 1 to 2 weeks. Has been associated with generalized weakness/fatigue, "dark urine" and "light stools."  Pt states she was having "loose BM's" but not diarrhea. States she was eval in the ED 4 days ago for same, dx diarrhea and hyperglycemia. States since then she has noticed she has been "itching all over" and "my eyes are turning yellow." Denies CP/SOB, no cough, no back pain, no N/V/D, no fevers, no rash. Pt was evaluated in the ED today for these symptoms and left AMA. States she returned now "to get admitted."    Past Medical History  Diagnosis Date  . Diabetes mellitus without complication   . Hypertension   . Anxiety   . Arthritis     Past Surgical History  Procedure Laterality Date  . Abdominal hysterectomy        History  Substance Use Topics  . Smoking status: Current Every Day Smoker -- 1.00 packs/day  . Smokeless tobacco: Not on file  . Alcohol Use: No      Review of Systems ROS: Statement: All systems negative except as marked or noted in the HPI; Constitutional: Negative for fever and chills. +generalized weakness/fatigue. ; ; Eyes: Negative for eye pain, redness and discharge. ; ; ENMT: Negative for ear pain, hoarseness, nasal congestion, sinus pressure and sore throat. ; ; Cardiovascular: Negative for chest pain, palpitations, diaphoresis, dyspnea and peripheral edema. ; ; Respiratory: Negative for cough, wheezing and stridor. ; ; Gastrointestinal: +abd pain, "light colored stools." Negative for nausea, vomiting, blood in stool, hematemesis, jaundice and rectal bleeding. . ; ; Genitourinary: +"dark urine." Negative for dysuria, flank pain and hematuria. ; ;  Musculoskeletal: Negative for back pain and neck pain. Negative for swelling and trauma.; ; Skin: +generalized itching. Negative for rash, abrasions, blisters, bruising and skin lesion.; ; Neuro: Negative for headache, lightheadedness and neck stiffness. Negative for weakness, altered level of consciousness , altered mental status, extremity weakness, paresthesias, involuntary movement, seizure and syncope.       Allergies  Review of patient's allergies indicates no known allergies.  Home Medications   Current Outpatient Rx  Name  Route  Sig  Dispense  Refill  . ALPRAZolam (XANAX) 1 MG tablet   Oral   Take 1 mg by mouth 3 (three) times daily as needed for sleep or anxiety.         Marland Kitchen amLODipine (NORVASC) 2.5 MG tablet   Oral   Take 2.5 mg by mouth daily.         Marland Kitchen aspirin 81 MG tablet   Oral   Take 81 mg by mouth daily.         . hydrochlorothiazide (HYDRODIURIL) 25 MG tablet   Oral   Take 25 mg by mouth daily.         Marland Kitchen loperamide (IMODIUM) 2 MG capsule   Oral   Take 2 mg by mouth 4 (four) times daily as needed for diarrhea or loose stools.         Marland Kitchen losartan (COZAAR) 100 MG tablet   Oral   Take 100 mg by mouth daily.         Marland Kitchen  metFORMIN (GLUCOPHAGE) 500 MG tablet   Oral   Take 1 tablet (500 mg total) by mouth daily with breakfast.   14 tablet   0   . potassium chloride (K-DUR,KLOR-CON) 10 MEQ tablet   Oral   Take 10 mEq by mouth 2 (two) times daily.         . simvastatin (ZOCOR) 20 MG tablet   Oral   Take 20 mg by mouth every evening.         Marland Kitchen HYDROcodone-acetaminophen (NORCO/VICODIN) 5-325 MG per tablet   Oral   Take 1 tablet by mouth every 4 (four) hours as needed for pain.           BP 149/85  Pulse 108  Temp(Src) 100.6 F (38.1 C) (Oral)  Resp 18  SpO2 100%  Physical Exam 1740: Physical examination:  Nursing notes reviewed; Vital signs and O2 SAT reviewed;  Constitutional: Well developed, Well nourished, Well hydrated, In no  acute distress; Head:  Normocephalic, atraumatic; Eyes: EOMI, PERRL, +scleral icterus; ENMT: Mouth and pharynx normal, Mucous membranes moist; Neck: Supple, Full range of motion, No lymphadenopathy; Cardiovascular: Regular rate and rhythm, No gallop; Respiratory: Breath sounds clear & equal bilaterally, No rales, rhonchi, wheezes.  Speaking full sentences with ease, Normal respiratory effort/excursion; Chest: Nontender, Movement normal; Abdomen: Soft, Nontender, Nondistended, Normal bowel sounds; Genitourinary: No CVA tenderness; Extremities: Pulses normal, No tenderness, No edema, No calf edema or asymmetry.; Neuro: AA&Ox3, Major CN grossly intact.  Speech clear. No gross focal motor or sensory deficits in extremities.; Skin: Color normal, Warm, Dry.   ED Course  Procedures    MDM  MDM Reviewed: previous chart, nursing note and vitals Reviewed previous: labs and CT scan Interpretation: x-ray     Results for orders placed during the hospital encounter of 05/07/13  URINALYSIS, ROUTINE W REFLEX MICROSCOPIC      Result Value Range   Color, Urine YELLOW  YELLOW   APPearance CLEAR  CLEAR   Specific Gravity, Urine 1.010  1.005 - 1.030   pH 5.5  5.0 - 8.0   Glucose, UA 500 (*) NEGATIVE mg/dL   Hgb urine dipstick TRACE (*) NEGATIVE   Bilirubin Urine LARGE (*) NEGATIVE   Ketones, ur NEGATIVE  NEGATIVE mg/dL   Protein, ur NEGATIVE  NEGATIVE mg/dL   Urobilinogen, UA 0.2  0.0 - 1.0 mg/dL   Nitrite NEGATIVE  NEGATIVE   Leukocytes, UA NEGATIVE  NEGATIVE  URINE MICROSCOPIC-ADD ON      Result Value Range   Squamous Epithelial / LPF RARE  RARE   WBC, UA 0-2  <3 WBC/hpf   Ct Abdomen Pelvis W Contrast 05/07/2013   *RADIOLOGY REPORT*  Clinical Data: Abdominal pain  CT ABDOMEN AND PELVIS WITH CONTRAST  Technique:  Multidetector CT imaging of the abdomen and pelvis was performed following the standard protocol during bolus administration of intravenous contrast.  Contrast: 50mL OMNIPAQUE IOHEXOL 300  MG/ML  SOLN, OMNIPAQUE IOHEXOL 300 MG/ML  SOLN  Comparison: None.  Findings:  Lower Chest:  Macrolobulated 1.4 x 1.0 cm nodule in the inferior medial aspect of the right lower lobe demonstrates some mild spiculation.  Otherwise, the lung bases are clear.  The visualized heart is at the upper limits of normal for size.  There is no pericardial effusion.  The distal thoracic esophagus is unremarkable.  Abdomen: Ill-defined low attenuation soft tissue mass emanating from the tail of the pancreas measures 7.2 x 5.7 x 6.5 cm and appears to directly invade the  adjacent spleen and kidney. Additionally, there is nodularity and thickening of the adjacent greater curvature of the stomach concerning for direct invasion. Numerous peritoneal nodules identified beginning in the gastrosplenic ligament and extending inferiorly within the left pericolic gutter and omentum.  The nodules range in size from a few millimeters to 1 cm.  Metastatic left periaortic lymphadenopathy. An index node measures 1.7 x 2.9 cm.  Numerous lesions identified throughout both the left and right lobes of the liver.  The single largest lesion in the right hepatic lobe measures up to 6 cm in greatest dimension. Intra and extrahepatic biliary ductal dilatation is noted.  The common bile duct measures up to 1.3 cm before abruptly disappearing, likely secondary to compression from metastatic hilar adenopathy.  The hilar nodal tissue is somewhat ill-defined and difficult to measured directly.  The hepatic and portal veins remain patent.  No definite adrenal involvement.  No the gallbladder is decompressed.  No hydronephrosis.  A 2.5 cm low attenuation cystic lesion in the upper pole left kidney likely reflects a renal cyst or focal caliectasis secondary to segmental obstruction of the upper pole collecting system.  A small volume of perihepatic ascites.   Normal-caliber large and small bowel throughout the abdomen.  No evidence of obstruction.  Normal  appendix in the right lower quadrant.  Pelvis: The bladder is distant with urine.  Surgical changes of prior hysterectomy.  Bones: No acute fracture or aggressive appearing lytic or blastic osseous lesion.  Low  Vascular: Scant atherosclerotic vascular calcifications without aneurysmal dilatation or significant appearing stenosis.  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  The splenic vein may be thrombosed.  IMPRESSION:  1.  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  The primary 7.2 cm mass appears to emanate from the pancreatic tail and directly invades the adjacent spleen, kidney and likely the gastric wall.  There are numerous hepatic metastases, metastatic retroperitoneal and porta hepatis adenopathy as well as peritoneal and omental implants with presumed small volume malignant ascites around the liver.  One of the hepatic lesions would be amendable to ultrasound-guided biopsy to confirm tissue diagnosis.  2. A 1.4 macrolobulated slightly spiculated nodule in the right lower lobe of the lung likely reflects pulmonary metastasis.  Given the slight spiculation, a primary lung malignancy is a less likely possibility.  3.  Cystic structure in the lower pole of the left kidney may reflect a cyst or focal caliectasis from segmental obstruction of the upper pole collecting system.  These results were called by telephone on 05/07/2013 at 03:45 p.m. to Dr. Bethann Berkshire, who verbally acknowledged these results.   Original Report Authenticated By: Malachy Moan, M.D.   Dg Chest 2 View 05/07/2013   *RADIOLOGY REPORT*  Clinical Data: Abdominal pain and diarrhea.  Diabetes.  CHEST - 2 VIEW  Comparison: CT abdomen pelvis 04/27/2013.  Findings: Right lung base 1 cm nodule as seen on CT performed same date.  Tiny nodule left lung base on the CT (series 7 image three) not well appreciated on the present plain film  examination.  Questionable 9 mm nodule left midlung zone.  Tortuous aorta.  No infiltrate, congestive heart failure or pneumothorax.  Mild thoracic kyphosis without obvious bony destructive lesion.  IMPRESSION: Pulmonary metastatic disease suspected as detailed above.  Please see recent CT report.   Original Report Authenticated By: Lacy Duverney, M.D.     1755:  Dx and testing d/w  pt.  Questions answered.  Verb understanding, agreeable to admit.  T/C to [facility] Dr. Kerry Hough, case discussed, including:  HPI, pertinent PM/SHx, VS/PE, dx testing, ED course and treatment:  Agreeable to admit, requests to obtain UA/UC and CXR due to fever, write temporary orders, obtain tele bed to team 2.        Laray Anger, DO 05/10/13 0111

## 2013-05-07 NOTE — ED Notes (Signed)
Patient informed that a urine sample is needed at this time. Patient states she will let us know when she can give one.

## 2013-05-07 NOTE — H&P (Signed)
[facility] History and Physical  [name] ZOX:[id number] DOB: [birth year] DOA: 05/07/2013  Referring physician: Dr. Clarene Duke, emergency room physician PCP: [name], MD  Specialists:   Chief Complaint: Abdominal pain  HPI: [name] is a 67 y.o. female who presents to the emergency room with complaints of abdominal pain. Patient reports onset of abdominal pain approximately 10 days ago. She's had associated diarrhea with it. She denies any vomiting. She describes the pain as right-sided, constant type pain. She reports having night sweats for the past many months. She also describes a significant level he has a 30-40 pounds over the last 3 years, but also reports that she has been exercising and trying to lose weight. She is also noted that her urine has become dark over the past 2 days and her stools have become white in color. She's had significant itching in her skin. She was evaluated in the emergency room and noted to have a significant obstructive jaundice. CT scan of the abdomen and pelvis which showed widespread metastatic disease. She's been referred for admission.  Review of Systems: Pertinent positives in history of present illness, otherwise negative  Past Medical History  Diagnosis Date  . Diabetes mellitus without complication   . Hypertension   . Anxiety   . Arthritis    Past Surgical History  Procedure Laterality Date  . Abdominal hysterectomy     Social History:  reports that she has been smoking.  She does not have any smokeless tobacco history on file. She reports that she does not drink alcohol or use illicit drugs.   No Known Allergies  Family history: Mother has history of hypertension  Prior to Admission medications   Medication Sig Start Date End Date Taking? Authorizing Provider  ALPRAZolam Prudy Feeler) 1 MG tablet Take 1 mg by mouth 3 (three) times daily as needed for sleep or anxiety.   Yes Historical Provider, MD  amLODipine (NORVASC)  2.5 MG tablet Take 2.5 mg by mouth daily.   Yes Historical Provider, MD  aspirin 81 MG tablet Take 81 mg by mouth daily.   Yes Historical Provider, MD  hydrochlorothiazide (HYDRODIURIL) 25 MG tablet Take 25 mg by mouth daily.   Yes Historical Provider, MD  loperamide (IMODIUM) 2 MG capsule Take 2 mg by mouth 4 (four) times daily as needed for diarrhea or loose stools.   Yes Historical Provider, MD  losartan (COZAAR) 100 MG tablet Take 100 mg by mouth daily.   Yes Historical Provider, MD  metFORMIN (GLUCOPHAGE) 500 MG tablet Take 1 tablet (500 mg total) by mouth daily with breakfast. 05/04/13  Yes Joya Gaskins, MD  potassium chloride (K-DUR,KLOR-CON) 10 MEQ tablet Take 10 mEq by mouth 2 (two) times daily.   Yes Historical Provider, MD  simvastatin (ZOCOR) 20 MG tablet Take 20 mg by mouth every evening.   Yes Historical Provider, MD  HYDROcodone-acetaminophen (NORCO/VICODIN) 5-325 MG per tablet Take 1 tablet by mouth every 4 (four) hours as needed for pain.    Historical Provider, MD   Physical Exam: Filed Vitals:   05/07/13 1706  BP: 149/85  Pulse: 108  Temp: 100.6 F (38.1 C)  TempSrc: Oral  Resp: 18  SpO2: 100%     General:  No acute distress  Eyes: Pupils are equal and reactive to light, scleral icterus is present  ENT: Mucous membranes are dry  Neck: Supple  Cardiovascular: S1, S2, regular rate and rhythm  Respiratory: Clear to auscultation bilaterally  Abdomen: Soft, nontender,  nondistended, bowel sounds are active  Skin: No rashes  Musculoskeletal: No pedal edema bilaterally  Psychiatric: Normal affect, cooperative with exam  Neurologic: Grossly intact, nonfocal  Labs on Admission:  Basic Metabolic Panel:  Recent Labs Lab 05/04/13 0034 05/07/13 1231  NA 136 133*  K 3.3* 3.2*  CL 96 95*  CO2  --  25  GLUCOSE 394* 356*  BUN 14 23  CREATININE 1.20* 0.85  CALCIUM  --  9.2   Liver Function Tests:  Recent Labs Lab 05/07/13 1231  AST 92*  ALT 123*   ALKPHOS 429*  BILITOT 14.6*  PROT 7.3  ALBUMIN 2.8*   No results found for this basename: LIPASE, AMYLASE,  in the last 168 hours No results found for this basename: AMMONIA,  in the last 168 hours CBC:  Recent Labs Lab 05/04/13 0034 05/07/13 1231  WBC  --  11.3*  NEUTROABS  --  8.4*  HGB 13.6 11.5*  HCT 40.0 32.4*  MCV  --  84.8  PLT  --  242   Cardiac Enzymes: No results found for this basename: CKTOTAL, CKMB, CKMBINDEX, TROPONINI,  in the last 168 hours  BNP (last 3 results) No results found for this basename: PROBNP,  in the last 8760 hours CBG: No results found for this basename: GLUCAP,  in the last 168 hours  Radiological Exams on Admission: Dg Chest 2 View  05/07/2013   *RADIOLOGY REPORT*  Clinical Data: Abdominal pain and diarrhea.  Diabetes.  CHEST - 2 VIEW  Comparison: CT abdomen pelvis 04/27/2013.  Findings: Right lung base 1 cm nodule as seen on CT performed same date.  Tiny nodule left lung base on the CT (series 7 image three) not well appreciated on the present plain film examination.  Questionable 9 mm nodule left midlung zone.  Tortuous aorta.  No infiltrate, congestive heart failure or pneumothorax.  Mild thoracic kyphosis without obvious bony destructive lesion.  IMPRESSION: Pulmonary metastatic disease suspected as detailed above.  Please see recent CT report.   Original Report Authenticated By: Lacy Duverney, M.D.   Ct Abdomen Pelvis W Contrast  05/07/2013   *RADIOLOGY REPORT*  Clinical Data: Abdominal pain  CT ABDOMEN AND PELVIS WITH CONTRAST  Technique:  Multidetector CT imaging of the abdomen and pelvis was performed following the standard protocol during bolus administration of intravenous contrast.  Contrast: 50mL OMNIPAQUE IOHEXOL 300 MG/ML  SOLN, OMNIPAQUE IOHEXOL 300 MG/ML  SOLN  Comparison: None.  Findings:  Lower Chest:  Macrolobulated 1.4 x 1.0 cm nodule in the inferior medial aspect of the right lower lobe demonstrates some mild spiculation.   Otherwise, the lung bases are clear.  The visualized heart is at the upper limits of normal for size.  There is no pericardial effusion.  The distal thoracic esophagus is unremarkable.  Abdomen: Ill-defined low attenuation soft tissue mass emanating from the tail of the pancreas measures 7.2 x 5.7 x 6.5 cm and appears to directly invade the adjacent spleen and kidney. Additionally, there is nodularity and thickening of the adjacent greater curvature of the stomach concerning for direct invasion. Numerous peritoneal nodules identified beginning in the gastrosplenic ligament and extending inferiorly within the left pericolic gutter and omentum.  The nodules range in size from a few millimeters to 1 cm.  Metastatic left periaortic lymphadenopathy. An index node measures 1.7 x 2.9 cm.  Numerous lesions identified throughout both the left and right lobes of the liver.  The single largest lesion in the right hepatic  lobe measures up to 6 cm in greatest dimension. Intra and extrahepatic biliary ductal dilatation is noted.  The common bile duct measures up to 1.3 cm before abruptly disappearing, likely secondary to compression from metastatic hilar adenopathy.  The hilar nodal tissue is somewhat ill-defined and difficult to measured directly.  The hepatic and portal veins remain patent.  No definite adrenal involvement.  No the gallbladder is decompressed.  No hydronephrosis.  A 2.5 cm low attenuation cystic lesion in the upper pole left kidney likely reflects a renal cyst or focal caliectasis secondary to segmental obstruction of the upper pole collecting system.  A small volume of perihepatic ascites.   Normal-caliber large and small bowel throughout the abdomen.  No evidence of obstruction.  Normal appendix in the right lower quadrant.  Pelvis: The bladder is distant with urine.  Surgical changes of prior hysterectomy.  Bones: No acute fracture or aggressive appearing lytic or blastic osseous lesion.  Low  Vascular:  Scant atherosclerotic vascular calcifications without aneurysmal dilatation or significant appearing stenosis.  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  The splenic vein may be thrombosed.  IMPRESSION:  1.  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  The primary 7.2 cm mass appears to emanate from the pancreatic tail and directly invades the adjacent spleen, kidney and likely the gastric wall.  There are numerous hepatic metastases, metastatic retroperitoneal and porta hepatis adenopathy as well as peritoneal and omental implants with presumed small volume malignant ascites around the liver.  One of the hepatic lesions would be amendable to ultrasound-guided biopsy to confirm tissue diagnosis.  2. A 1.4 macrolobulated slightly spiculated nodule in the right lower lobe of the lung likely reflects pulmonary metastasis.  Given the slight spiculation, a primary lung malignancy is a less likely possibility.  3.  Cystic structure in the lower pole of the left kidney may reflect a cyst or focal caliectasis from segmental obstruction of the upper pole collecting system.  These results were called by telephone on 05/07/2013 at 03:45 p.m. to Dr. Bethann Berkshire, who verbally acknowledged these results.   Original Report Authenticated By: Malachy Moan, M.D.    Assessment/Plan Active Problems:   Pancreatic mass   Obstructive jaundice   Hypokalemia   Dehydration   Tobacco abuse   HTN (hypertension)   Diabetes mellitus   1. Obstructive jaundice, secondary to pancreatic mass. We will request a gastroenterology consultation. Patient may be candidate for a palliative biliary stent. We'll also start the patient on Rocephin to cover any intra-abdominal processes since she does have a low-grade fever. 2. Abdominal pain. Likely secondary to malignancy. We'll provide narcotics as needed. 3. Pancreatic mass. High  probability of malignancy. This appears to be stage IV. We'll request an oncology consultation to discuss further treatment options and the need for possible biopsy. Per CT report, hepatic lesion may be amenable to biopsy. 4. Hypertension. Continue her outpatient regimen 5. Diabetes. We'll start on sliding scale insulin 6. Tobacco abuse. Patient was counseled on the importance of tobacco cessation.    Code Status: full code. I'm not quite sure if she truly comprehends the severity of her disease Family Communication: discussed with patient Disposition Plan: pending hospital course  Time spent:  [facility] Pager [phone]  If 7PM-7AM, please contact night-coverage [email or web address] Password [facility] 05/07/2013, 7:33 PM

## 2013-05-07 NOTE — ED Notes (Signed)
Pt seen in ED Saturday and dx with stomach virus and uti. Pt c/o continued weakness. Pt c/o burning with urination. Pt also c/o generalized itching.

## 2013-05-08 LAB — COMPREHENSIVE METABOLIC PANEL
BUN: 14 mg/dL (ref 6–23)
CO2: 25 mEq/L (ref 19–32)
Calcium: 8.6 mg/dL (ref 8.4–10.5)
Creatinine, Ser: 0.76 mg/dL (ref 0.50–1.10)
GFR calc Af Amer: >90 mL/min (ref >90)
GFR calc non Af Amer: 87 mL/min — ABNORMAL LOW (ref >90)
Glucose, Bld: 215 mg/dL — ABNORMAL HIGH (ref 70–99)

## 2013-05-08 LAB — GLUCOSE, CAPILLARY
Glucose-Capillary: 236 mg/dL — ABNORMAL HIGH (ref 70–99)
Glucose-Capillary: 172 mg/dL — ABNORMAL HIGH (ref 70–99)
Glucose-Capillary: 203 mg/dL — ABNORMAL HIGH (ref 70–99)

## 2013-05-08 LAB — CBC
Hemoglobin: 10.9 g/dL — ABNORMAL LOW (ref 12.0–15.0)
RBC: 3.68 MIL/uL — ABNORMAL LOW (ref 3.87–5.11)
WBC: 10.8 10*3/uL — ABNORMAL HIGH (ref 4.0–10.5)

## 2013-05-08 MED ORDER — POTASSIUM CHLORIDE 10 MEQ/100ML IV SOLN
10.0000 meq | INTRAVENOUS | Status: AC
  Administered 2013-05-08 (×3): 10 meq via INTRAVENOUS
  Filled 2013-05-08: qty 300

## 2013-05-08 NOTE — Progress Notes (Signed)
Dr. Renard Matter called with new orders for Potassium IV x3. And a repeat BMP. Orders put in [facility].

## 2013-05-08 NOTE — Consult Note (Signed)
Patient History and Physical   [name] [id number] 02/26/1946 67 y.o. 05/08/2013  Referring WU:[id number],[email or web address], MD   Chief Complaint: "Pancreatic cancer"  HPI:  [name] is a 67 year old woman who was recently admitted through the emergency room with complaints of abdominal pain which has been going on for a few days prior to hospitalization.  She was admitted and 05/07/2013. Patient does not think she has lost a significant amount of weight however in her H/P it was stated that she may have lost 30-  40 pounds in the last the last 3 years.She did state however that she was diabetic and has been intentionally losing weight to control her diabetes.    She tells me that over the last 4-5 days she's been having right upper pain sometimes associated with eating.  She has had associated diarrhea for the last 3-4 days sometimes having up to 7-8 bowel movements a day which she says was initially  greenish  But later became grey/clay colored.  She has also noticed very dark urine.  She also tells me that last Saturday her friend pointed out to her that she was jaundiced.  She reports that she ultimately came to the ER due to diarrhea and to some extent abdominal pain.  She denies any nausea or vomiting but did admit to pruritus starting last week.  She also reports night sweats on the last 5 months.  She states that had not tolerated given her medication for hot flashes but has not done so yet.   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  The primary 7.2 cm mass appears to emanate from the pancreatic tail and directly invades the adjacent spleen, kidney and likely the gastric wall.  There are numerous hepatic metastases, metastatic retroperitoneal  and porta hepatis adenopathy as well as peritoneal and omental implants with presumed small volume malignant ascites around the liver"  Multiple liver masses were noted; "Numerous lesions identified throughout both the left and right lobes of the liver.  The single largest lesion in the right hepatic lobe measures up to 6 cm in greatest dimension".  I showed these images with the patient and her daughter who was with her in the room.  She denies any loss of appetite, or shortness of breath or cough.  She denies fever but states that occasionally she has chills.  She tells me that she has  not required any pain medication for her abdominal pain prior to hospital stay.          Filed Vitals:   05/07/13 1706 05/07/13 2048 05/08/13 0501 05/08/13 1415  BP: 149/85 173/82 151/70 149/69  Pulse: 108 74 68 61  Temp: 100.6 F (38.1 C) 98.1 F (36.7 C) 98.2 F (36.8 C) 98 F (36.7 C)  TempSrc: Oral Oral Oral Oral  Resp: 18 20 20 20   Height:  5\' 6"  (1.676 m)    Weight:  154 lb 5.2 oz (70 kg) 154 lb 8.7 oz (70.1 kg)   SpO2: 100% 97% 97% 94%    PMH: Past Medical History  Diagnosis Date  . Diabetes mellitus without complication   . Hypertension   . Anxiety   . Arthritis     Past Surgical History  Procedure Laterality Date  . Abdominal hysterectomy  Allergies: No Known Allergies  Medications: Current facility-administered medications:0.9 % NaCl with KCl 20 mEq/ L  infusion, , Intravenous, Continuous, Erick Blinks, MD, Last Rate: 100 mL/hr at 05/07/13 2246;  acetaminophen (TYLENOL) suppository 650 mg, 650 mg, Rectal, Q6H PRN, Erick Blinks, MD;  acetaminophen (TYLENOL) tablet 650 mg, 650 mg, Oral, Q6H PRN, Erick Blinks, MD;  ALPRAZolam Prudy Feeler) tablet 1 mg, 1 mg, Oral, TID PRN, Erick Blinks, MD amLODipine (NORVASC) tablet 2.5 mg, 2.5 mg, Oral, Daily, Erick Blinks, MD, 2.5 mg at 05/08/13 0819;  aspirin chewable tablet 81 mg, 81 mg, Oral, Daily, Erick Blinks, MD, 81 mg at  05/08/13 0820;  cefTRIAXone (ROCEPHIN) 1 g in dextrose 5 % 50 mL IVPB, 1 g, Intravenous, Q24H, Erick Blinks, MD, 1 g at 05/07/13 2246;  enoxaparin (LOVENOX) injection 40 mg, 40 mg, Subcutaneous, Q24H, Erick Blinks, MD, 40 mg at 05/07/13 2248 HYDROcodone-acetaminophen (NORCO/VICODIN) 5-325 MG per tablet 1 tablet, 1 tablet, Oral, Q4H PRN, Erick Blinks, MD;  insulin aspart (novoLOG) injection 0-15 Units, 0-15 Units, Subcutaneous, TID WC, Erick Blinks, MD, 3 Units at 05/08/13 1200;  insulin aspart (novoLOG) injection 0-5 Units, 0-5 Units, Subcutaneous, QHS, Erick Blinks, MD, 4 Units at 05/07/13 2248 losartan (COZAAR) tablet 100 mg, 100 mg, Oral, Daily, Erick Blinks, MD, 100 mg at 05/08/13 0820;  morphine 2 MG/ML injection 2 mg, 2 mg, Intravenous, Q4H PRN, Erick Blinks, MD;  ondansetron (ZOFRAN) injection 4 mg, 4 mg, Intravenous, Q6H PRN, Erick Blinks, MD;  ondansetron (ZOFRAN) tablet 4 mg, 4 mg, Oral, Q6H PRN, Erick Blinks, MD;  simvastatin (ZOCOR) tablet 20 mg, 20 mg, Oral, q1800, Erick Blinks, MD, 20 mg at 05/07/13 2247 sodium chloride 0.9 % injection 3 mL, 3 mL, Intravenous, Q12H, Erick Blinks, MD   Social History:   reports that she has been smoking.  She does not have any smokeless tobacco history on file. She reports that she does not drink alcohol or use illicit drugs. she has about 10 pack years of smoking continues to smoke half a pack of cigarettes a day. She he is presently unemployed and lives with her son and daughter.  She used to work for [employer] previously.  Family History: History reviewed. No pertinent family history. She tells me that her brother died of "bone cancer" Otherwise denies any other family history of cancer.  Review of Systems:14 point review of system is as in the history above otherwise negative.    Physical Exam: Blood pressure 149/69, pulse 61, temperature 98 F (36.7 C), temperature source Oral, resp. rate 20, height 5\' 6"  (1.676 m),  weight 154 lb 8.7 oz (70.1 kg), SpO2 94.00%. GENERAL: No distress, well nourished.  SKIN:  No rashes or significant lesions  HEAD: Normocephalic, No masses, lesions, tenderness or abnormalities  EYES: severe degree of jaundice. ENT: External ears normal ,lips, buccal mucosa, and tongue normal and mucous membranes are dry, dentures in place. LYMPH: No palpable lymphadenopathy, in the neck supraclavicular  areas BREAST:Normal without mass, skin or nipple changes or axillary nodes,  LUNGS: clear to auscultation , no crackles or wheezes HEART: regular rate & rhythm, no murmurs, no gallops, S1 normal and S2 normal  ABDOMEN: Abdomen soft, non-tender,there is massive palpable hepatomegaly at least 8 CM below the costal margin. EXTREMITIES: No edema, no skin discoloration or tenderness NEURO: alert & oriented , no focal motor/sensory deficits.     Lab Results: Lab Results  Component Value Date   WBC 10.8* 05/08/2013   HGB 10.9* 05/08/2013  HCT 31.1* 05/08/2013   MCV 84.5 05/08/2013   PLT 265 05/08/2013   @LASTCHEMISTRY @    Radiological Studies: Dg Chest 2 View  05/07/2013   *RADIOLOGY REPORT*  Clinical Data: Abdominal pain and diarrhea.  Diabetes.  CHEST - 2 VIEW  Comparison: CT abdomen pelvis 04/27/2013.  Findings: Right lung base 1 cm nodule as seen on CT performed same date.  Tiny nodule left lung base on the CT (series 7 image three) not well appreciated on the present plain film examination.  Questionable 9 mm nodule left midlung zone.  Tortuous aorta.  No infiltrate, congestive heart failure or pneumothorax.  Mild thoracic kyphosis without obvious bony destructive lesion.  IMPRESSION: Pulmonary metastatic disease suspected as detailed above.  Please see recent CT report.   Original Report Authenticated By: Lacy Duverney, M.D.   Ct Abdomen Pelvis W Contrast  05/07/2013   *RADIOLOGY REPORT*  Clinical Data: Abdominal pain  CT ABDOMEN AND PELVIS WITH CONTRAST  Technique:  Multidetector CT  imaging of the abdomen and pelvis was performed following the standard protocol during bolus administration of intravenous contrast.  Contrast: 50mL OMNIPAQUE IOHEXOL 300 MG/ML  SOLN, OMNIPAQUE IOHEXOL 300 MG/ML  SOLN  Comparison: None.  Findings:  Lower Chest:  Macrolobulated 1.4 x 1.0 cm nodule in the inferior medial aspect of the right lower lobe demonstrates some mild spiculation.  Otherwise, the lung bases are clear.  The visualized heart is at the upper limits of normal for size.  There is no pericardial effusion.  The distal thoracic esophagus is unremarkable.  Abdomen: Ill-defined low attenuation soft tissue mass emanating from the tail of the pancreas measures 7.2 x 5.7 x 6.5 cm and appears to directly invade the adjacent spleen and kidney. Additionally, there is nodularity and thickening of the adjacent greater curvature of the stomach concerning for direct invasion. Numerous peritoneal nodules identified beginning in the gastrosplenic ligament and extending inferiorly within the left pericolic gutter and omentum.  The nodules range in size from a few millimeters to 1 cm.  Metastatic left periaortic lymphadenopathy. An index node measures 1.7 x 2.9 cm.  Numerous lesions identified throughout both the left and right lobes of the liver.  The single largest lesion in the right hepatic lobe measures up to 6 cm in greatest dimension. Intra and extrahepatic biliary ductal dilatation is noted.  The common bile duct measures up to 1.3 cm before abruptly disappearing, likely secondary to compression from metastatic hilar adenopathy.  The hilar nodal tissue is somewhat ill-defined and difficult to measured directly.  The hepatic and portal veins remain patent.  No definite adrenal involvement.  No the gallbladder is decompressed.  No hydronephrosis.  A 2.5 cm low attenuation cystic lesion in the upper pole left kidney likely reflects a renal cyst or focal caliectasis secondary to segmental obstruction of the  upper pole collecting system.  A small volume of perihepatic ascites.   Normal-caliber large and small bowel throughout the abdomen.  No evidence of obstruction.  Normal appendix in the right lower quadrant.  Pelvis: The bladder is distant with urine.  Surgical changes of prior hysterectomy.  Bones: No acute fracture or aggressive appearing lytic or blastic osseous lesion.  Low  Vascular: Scant atherosclerotic vascular calcifications without aneurysmal dilatation or significant appearing stenosis.  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  The splenic vein may be thrombosed.  IMPRESSION:  1.  CT findings are most consistent with advanced stage IV metastatic pancreatic adenocarcinoma complicated by malignant  biliary obstruction.  The primary 7.2 cm mass appears to emanate from the pancreatic tail and directly invades the adjacent spleen, kidney and likely the gastric wall.  There are numerous hepatic metastases, metastatic retroperitoneal and porta hepatis adenopathy as well as peritoneal and omental implants with presumed small volume malignant ascites around the liver.  One of the hepatic lesions would be amendable to ultrasound-guided biopsy to confirm tissue diagnosis.  2. A 1.4 macrolobulated slightly spiculated nodule in the right lower lobe of the lung likely reflects pulmonary metastasis.  Given the slight spiculation, a primary lung malignancy is a less likely possibility.  3.  Cystic structure in the lower pole of the left kidney may reflect a cyst or focal caliectasis from segmental obstruction of the upper pole collecting system.  These results were called by telephone on 05/07/2013 at 03:45 p.m. to Dr. Bethann Berkshire, who verbally acknowledged these results.   Original Report Authenticated By: Malachy Moan, M.D.      Impression: Ms. Berrett has multiple abdominal masses including pancreatic mass and multiple liver lesions.  Imaging scans are suggestive of metastatic pancreatic cancer  however it is important to exclude other possible primaries with a biopsy.  She has a severe obstructive jaundice  Which I think will benefit from gastrointestinal intervention with stenting.  The large right-sided liver mass is easily amenable to biopsy and I would recommend that this be biopsied for definitive histologic diagnosis.  Also tumor markers are not reasonable to obtain.  She is happier to have a huge burden of disease especially in the abdomen which often is a marker of poor prognosis. On the other hand she up just to be in relatively good performance status which predicts benefit from an antineoplastic treatment.    Recommendations: 1.  Treat as medically indicated. 2.  I recommend ultrasound-guided biopsy of right liver lesion for definitive diagnosis. 3.  I agree with gastrointestinal consultation for possible stenting of biliary obstruction. 4.  CT of the chest to complete staging workup  To document other sites of disease. 5.  Surgical consultation for Mediport placement again it's possible chemotherapy treatment as an outpatient. 6.  Followup with hematology oncology within one-week of discharge for discussion of potential treatment options. This will be based on histologic diagnosis. 7.  Will recommend obtaining CA 19-9 and CEA.   had a very long discussion with the patient and her daughter including face-to-face time of 35 minutes.  All questions were satisfactorily answered.  Thank you for the opportunity to be part of  the care of this patient, we'll plan on following up with her in the clinic on discharge.  Please do not hesitate to contact us if we can be further help while she is here.   Sherral Hammers, MD FACP. Hematology/Oncology.     Marland Kitchen

## 2013-05-09 ENCOUNTER — Inpatient Hospital Stay (HOSPITAL_COMMUNITY): Payer: Medicare Other

## 2013-05-09 ENCOUNTER — Ambulatory Visit (HOSPITAL_COMMUNITY): Admit: 2013-05-09 | Payer: Medicare Other

## 2013-05-09 ENCOUNTER — Encounter (HOSPITAL_COMMUNITY): Payer: Self-pay | Admitting: Gastroenterology

## 2013-05-09 DIAGNOSIS — K869 Disease of pancreas, unspecified: Secondary | ICD-10-CM

## 2013-05-09 DIAGNOSIS — K838 Other specified diseases of biliary tract: Secondary | ICD-10-CM

## 2013-05-09 LAB — GLUCOSE, CAPILLARY
Glucose-Capillary: 235 mg/dL — ABNORMAL HIGH (ref 70–99)
Glucose-Capillary: 255 mg/dL — ABNORMAL HIGH (ref 70–99)

## 2013-05-09 MED ORDER — INSULIN DETEMIR 100 UNIT/ML ~~LOC~~ SOLN
5.0000 [IU] | Freq: Every day | SUBCUTANEOUS | Status: DC
  Administered 2013-05-09 – 2013-05-13 (×5): 5 [IU] via SUBCUTANEOUS
  Filled 2013-05-09 (×5): qty 0.05

## 2013-05-09 MED ORDER — IOHEXOL 300 MG/ML  SOLN
80.0000 mL | Freq: Once | INTRAMUSCULAR | Status: AC | PRN
  Administered 2013-05-09: 80 mL via INTRAVENOUS

## 2013-05-09 MED ORDER — ENOXAPARIN SODIUM 40 MG/0.4ML ~~LOC~~ SOLN
40.0000 mg | SUBCUTANEOUS | Status: DC
  Administered 2013-05-09: 40 mg via SUBCUTANEOUS
  Filled 2013-05-09: qty 0.4

## 2013-05-09 NOTE — Progress Notes (Signed)
Inpatient Diabetes Program Recommendations  AACE/ADA: New Consensus Statement on Inpatient Glycemic Control (2013)  Target Ranges:  Prepandial:   less than 140 mg/dL      Peak postprandial:   less than 180 mg/dL (1-2 hours)      Critically ill patients:  140 - 180 mg/dL   Results for KAILENA, LUBAS (MRN [id number]) as of 05/09/2013 09:27  Ref. Range 05/08/2013 07:39 05/08/2013 11:25 05/08/2013 16:51 05/08/2013 21:42 05/09/2013 07:41  Glucose-Capillary Latest Range: 70-99 mg/dL 409 (H) 811 (H) 914 (H) 203 (H) 235 (H)    Inpatient Diabetes Program Recommendations Insulin - Basal: Please order low dose basal insulin; recommend starting with Levemir 5 units daily and adjust accordingly. HgbA1C: Please order an A1C to determine glycemic control over the past 2-3 months.  Note:  Blood glucose over the past 24 hours has ranged from 172-259 mg/dl.  Please order low dose basal insulin (recommend Levemir 5 units daily) and order an A1C to determine glycemic control over the past 2-3 months.  Will continue to follow.  Thanks, Orlando Penner, RN, MSN, CCRN Diabetes Coordinator Inpatient Diabetes Program [phone]

## 2013-05-09 NOTE — Progress Notes (Signed)
[name], MEINERS                ACCOUNT NO.:  [id number]  MEDICAL RECORD NO.:  [id number]  LOCATION:  A226                          FACILITY:  [facility]  PHYSICIAN:  Lizmarie Witters G. Renard Matter, MD   DATE OF BIRTH:  [birth year]  DATE OF PROCEDURE:  05/09/2013 DATE OF DISCHARGE:                                PROGRESS NOTE   SUBJECTIVE:  This patient remains fairly comfortable with minimal abdominal pain.  She does have a CT scan which showed evidence of pancreatic adenocarcinoma and pancreatic mass complicated by biliary obstructions and evidence of pulmonary metastasis and numerous lesions in left and right lobe of the liver and also impaired liver panel and elevated blood sugars.  She has been seen by oncology, who has recommended ultrasound-guided liver biopsy and mid port placement and GI consult and possible surgical consult.  OBJECTIVE:  GENERAL:  Alert female with blood pressure 148/76, respiration 20, pulse 59, temp 98.7. HEENT:  Eyes, PERRLA.  TM negative.  Oropharynx benign. NECK:  Supple.  No JVD or thyroid abnormalities. HEART:  Regular rhythm.  No murmurs. LUNGS:  Clear to P and A. ABDOMEN:  No palpable organs or masses. NEUROLOGIC:  No focal abnormalities.  ASSESSMENT:  The patient has a pancreatic mass probable adenocarcinoma with metastasis to liver and lungs, diabetes mellitus type 2 and recent hypokalemia.  PLAN:  To continue IV Rocephin continue IV fluids.  We will proceed with consult to Gastroenterology Service and surgery for Mediport placement. Repeat the meds today.     Abdiel Blackerby G. Renard Matter, MD     AGM/MEDQ  D:  05/09/2013  T:  05/09/2013  Job:  [id number]

## 2013-05-09 NOTE — Consult Note (Addendum)
Referring Provider: [name], MD Primary Care Physician:  [name], MD Primary Gastroenterologist:  Roetta Sessions, MD  Reason for Consultation:  Obstructive jaundice, pancreatic mass  HPI: [name] is a 67 y.o. female who presents to the emergency department with complaints of diarrhea, abdominal pain. Reports night sweats for the last 5-6 months (last 4-5 hours at time). Ten days ago started having 8 loose stools per day. Stool initially bright green. Stool then clay colored. Urine turned orange. No melena, brbpr. She did try to lose weight for diabetes and is down about 30 pounds in the last 3 years. Started going to gym 5 days per week since 2011. She developed some abdominal bloating with meals but would go away. No N/V. Some lower abdominal pain associated with diarrhea. Itching started one week ago. Her friend pointed out that she was jaundice. No fever. Occasional heartburn.   CT was obtained with findings most consistent with advanced stage IV metastatic pancreatic adenocarcinoma complicated by malignant biliary obstruction. Also felt to have pulmonary metastases. Total bilirubin is 14.6, alkaline phosphatase 429, AST 92, ALT 123, albumin 2.8.   Prior to Admission medications   Medication Sig Start Date End Date Taking? Authorizing Provider  ALPRAZolam Prudy Feeler) 1 MG tablet Take 1 mg by mouth 3 (three) times daily as needed for sleep or anxiety.   Yes Historical Provider, MD  amLODipine (NORVASC) 2.5 MG tablet Take 2.5 mg by mouth daily.   Yes Historical Provider, MD  aspirin 81 MG tablet Take 81 mg by mouth daily.   Yes Historical Provider, MD  hydrochlorothiazide (HYDRODIURIL) 25 MG tablet Take 25 mg by mouth daily.   Yes Historical Provider, MD  loperamide (IMODIUM) 2 MG capsule Take 2 mg by mouth 4 (four) times daily as needed for diarrhea or loose stools.   Yes Historical Provider, MD  losartan (COZAAR) 100 MG tablet Take 100 mg by mouth daily.   Yes Historical  Provider, MD  metFORMIN (GLUCOPHAGE) 500 MG tablet Take 1 tablet (500 mg total) by mouth daily with breakfast. 05/04/13  Yes Joya Gaskins, MD  potassium chloride (K-DUR,KLOR-CON) 10 MEQ tablet Take 10 mEq by mouth 2 (two) times daily.   Yes Historical Provider, MD  simvastatin (ZOCOR) 20 MG tablet Take 20 mg by mouth every evening.   Yes Historical Provider, MD  HYDROcodone-acetaminophen (NORCO/VICODIN) 5-325 MG per tablet Take 1 tablet by mouth every 4 (four) hours as needed for pain.    Historical Provider, MD    Current Facility-Administered Medications  Medication Dose Route Frequency Provider Last Rate Last Dose  . 0.9 % NaCl with KCl 20 mEq/ L  infusion   Intravenous Continuous Erick Blinks, MD 100 mL/hr at 05/09/13 0625    . acetaminophen (TYLENOL) tablet 650 mg  650 mg Oral Q6H PRN Erick Blinks, MD       Or  . acetaminophen (TYLENOL) suppository 650 mg  650 mg Rectal Q6H PRN Erick Blinks, MD      . ALPRAZolam Prudy Feeler) tablet 1 mg  1 mg Oral TID PRN Erick Blinks, MD   1 mg at 05/08/13 2143  . amLODipine (NORVASC) tablet 2.5 mg  2.5 mg Oral Daily Erick Blinks, MD   2.5 mg at 05/08/13 0819  . aspirin chewable tablet 81 mg  81 mg Oral Daily Erick Blinks, MD   81 mg at 05/08/13 0820  . cefTRIAXone (ROCEPHIN) 1 g in dextrose 5 % 50 mL IVPB  1 g Intravenous Q24H [employer],  MD   1 g at 05/08/13 2049  . enoxaparin (LOVENOX) injection 40 mg  40 mg Subcutaneous Q24H Erick Blinks, MD   40 mg at 05/08/13 2143  . HYDROcodone-acetaminophen (NORCO/VICODIN) 5-325 MG per tablet 1 tablet  1 tablet Oral Q4H PRN Erick Blinks, MD      . insulin aspart (novoLOG) injection 0-15 Units  0-15 Units Subcutaneous TID WC Erick Blinks, MD   3 Units at 05/08/13 1801  . insulin aspart (novoLOG) injection 0-5 Units  0-5 Units Subcutaneous QHS Erick Blinks, MD   2 Units at 05/08/13 2144  . losartan (COZAAR) tablet 100 mg  100 mg Oral Daily Erick Blinks, MD   100 mg at 05/08/13 0820  .  morphine 2 MG/ML injection 2 mg  2 mg Intravenous Q4H PRN Erick Blinks, MD      . ondansetron (ZOFRAN) tablet 4 mg  4 mg Oral Q6H PRN Erick Blinks, MD       Or  . ondansetron (ZOFRAN) injection 4 mg  4 mg Intravenous Q6H PRN Erick Blinks, MD      . simvastatin (ZOCOR) tablet 20 mg  20 mg Oral q1800 Erick Blinks, MD   20 mg at 05/08/13 1801  . sodium chloride 0.9 % injection 3 mL  3 mL Intravenous Q12H Erick Blinks, MD   3 mL at 05/08/13 2144    Allergies as of 05/07/2013  . (No Known Allergies)    Past Medical History  Diagnosis Date  . Diabetes mellitus without complication   . Hypertension   . Anxiety   . Arthritis     Past Surgical History  Procedure Laterality Date  . Abdominal hysterectomy    . Colonoscopy  2006    Dr. Jena Gauss, polyp, benign    Family History  Problem Relation Age of Onset  . Colon cancer Neg Hx   . Liver disease Neg Hx   . Bone cancer Brother     History   Social History  . Marital Status: Divorced    Spouse Name: N/A    Number of Children: 5  . Years of Education: N/A   Occupational History  . Not on file.   Social History Main Topics  . Smoking status: Current Every Day Smoker -- 1.00 packs/day  . Smokeless tobacco: Not on file  . Alcohol Use: No  . Drug Use: No  . Sexually Active: No   Other Topics Concern  . Not on file   Social History Narrative   Daughter, deceased age 65     ROS:  General: Negative for anorexia, unintentional weight loss, fever, chills, fatigue, weakness. See hpi. Eyes: Negative for vision changes.  ENT: Negative for hoarseness, difficulty swallowing , nasal congestion. CV: Negative for chest pain, angina, palpitations, dyspnea on exertion, peripheral edema.  Respiratory: Negative for dyspnea at rest, dyspnea on exertion, cough, sputum, wheezing.  GI: See history of present illness. GU:  Negative for dysuria, hematuria, urinary incontinence, urinary frequency, nocturnal urination.  MS: Negative  for joint pain, low back pain.  Derm: Negative for rash or itching.  Neuro: Negative for weakness, abnormal sensation, seizure, frequent headaches, memory loss, confusion.  Psych: Negative for anxiety, depression, suicidal ideation, hallucinations.  Endo: Negative for unusual weight change.  Heme: Negative for bruising or bleeding. Allergy: Negative for rash or hives.       Physical Examination: Vital signs in last 24 hours: Temp:  [98 F (36.7 C)-98.7 F (37.1 C)] 98.7 F (37.1 C) (06/13 0548) Pulse  Rate:  [59-68] 59 (06/13 0548) Resp:  [20-24] 20 (06/13 0548) BP: (148-157)/(69-76) 148/76 mmHg (06/13 0548) SpO2:  [94 %-96 %] 96 % (06/13 0548) Last BM Date: 05/07/13  General: Well-nourished, well-developed in no acute distress. Accompanied by grandson. Head: Normocephalic, atraumatic.   Eyes: Conjunctiva pink. Scleral icterus. Mouth: Oropharyngeal mucosa moist and pink , no lesions erythema or exudate. Neck: Supple without thyromegaly, masses, or lymphadenopathy.  Lungs: Clear to auscultation bilaterally.  Heart: Regular rate and rhythm, no murmurs rubs or gallops.  Abdomen: Bowel sounds are normal, nontender, nondistended, no hepatosplenomegaly or masses, no abdominal bruits or    hernia , no rebound or guarding.   Rectal: not performed Extremities: No lower extremity edema, clubbing, deformity.  Neuro: Alert and oriented x 4 , grossly normal neurologically.  Skin: Warm and dry, no rash or jaundice.   Psych: Alert and cooperative, normal mood and affect.        Intake/Output from previous day: 06/12 0701 - 06/13 0700 In: 2550 [P.O.:480; I.V.:2070] Out: -  Intake/Output this shift:    Lab Results: CBC  Recent Labs  05/07/13 1231 05/08/13 0518  WBC 11.3* 10.8*  HGB 11.5* 10.9*  HCT 32.4* 31.1*  MCV 84.8 84.5  PLT 242 265   BMET  Recent Labs  05/07/13 1231 05/08/13 0518  NA 133* 134*  K 3.2* 3.1*  CL 95* 99  CO2 25 25  GLUCOSE 356* 215*  BUN 23 14   CREATININE 0.85 0.76  CALCIUM 9.2 8.6   LFT  Recent Labs  05/07/13 1231 05/08/13 0518  BILITOT 14.6* 14.1*  ALKPHOS 429* 376*  AST 92* 104*  ALT 123* 115*  PROT 7.3 6.5  ALBUMIN 2.8* 2.4*     PT/INR No results found for this basename: LABPROT, INR,  in the last 72 hours    Imaging Studies: Dg Chest 2 View  05/07/2013   *RADIOLOGY REPORT*  Clinical Data: Abdominal pain and diarrhea.  Diabetes.  CHEST - 2 VIEW  Comparison: CT abdomen pelvis 04/27/2013.  Findings: Right lung base 1 cm nodule as seen on CT performed same date.  Tiny nodule left lung base on the CT (series 7 image three) not well appreciated on the present plain film examination.  Questionable 9 mm nodule left midlung zone.  Tortuous aorta.  No infiltrate, congestive heart failure or pneumothorax.  Mild thoracic kyphosis without obvious bony destructive lesion.  IMPRESSION: Pulmonary metastatic disease suspected as detailed above.  Please see recent CT report.   Original Report Authenticated By: Lacy Duverney, M.D.   Ct Abdomen Pelvis W Contrast  05/07/2013   *RADIOLOGY REPORT*  Clinical Data: Abdominal pain  CT ABDOMEN AND PELVIS WITH CONTRAST  Technique:  Multidetector CT imaging of the abdomen and pelvis was performed following the standard protocol during bolus administration of intravenous contrast.  Contrast: 50mL OMNIPAQUE IOHEXOL 300 MG/ML  SOLN, OMNIPAQUE IOHEXOL 300 MG/ML  SOLN  Comparison: None.  Findings:  Lower Chest:  Macrolobulated 1.4 x 1.0 cm nodule in the inferior medial aspect of the right lower lobe demonstrates some mild spiculation.  Otherwise, the lung bases are clear.  The visualized heart is at the upper limits of normal for size.  There is no pericardial effusion.  The distal thoracic esophagus is unremarkable.  Abdomen: Ill-defined low attenuation soft tissue mass emanating from the tail of the pancreas measures 7.2 x 5.7 x 6.5 cm and appears to directly invade the adjacent spleen and kidney.  Additionally, there is nodularity  and thickening of the adjacent greater curvature of the stomach concerning for direct invasion. Numerous peritoneal nodules identified beginning in the gastrosplenic ligament and extending inferiorly within the left pericolic gutter and omentum.  The nodules range in size from a few millimeters to 1 cm.  Metastatic left periaortic lymphadenopathy. An index node measures 1.7 x 2.9 cm.  Numerous lesions identified throughout both the left and right lobes of the liver.  The single largest lesion in the right hepatic lobe measures up to 6 cm in greatest dimension. Intra and extrahepatic biliary ductal dilatation is noted.  The common bile duct measures up to 1.3 cm before abruptly disappearing, likely secondary to compression from metastatic hilar adenopathy.  The hilar nodal tissue is somewhat ill-defined and difficult to measured directly.  The hepatic and portal veins remain patent.  No definite adrenal involvement.  No the gallbladder is decompressed.  No hydronephrosis.  A 2.5 cm low attenuation cystic lesion in the upper pole left kidney likely reflects a renal cyst or focal caliectasis secondary to segmental obstruction of the upper pole collecting system.  A small volume of perihepatic ascites.   Normal-caliber large and small bowel throughout the abdomen.  No evidence of obstruction.  Normal appendix in the right lower quadrant.  Pelvis: The bladder is distant with urine.  Surgical changes of prior hysterectomy.  Bones: No acute fracture or aggressive appearing lytic or blastic osseous lesion.  Low  Vascular: Scant atherosclerotic vascular calcifications without aneurysmal dilatation or significant appearing stenosis.  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  The splenic vein may be thrombosed.  IMPRESSION:  1.  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  The primary 7.2 cm  mass appears to emanate from the pancreatic tail and directly invades the adjacent spleen, kidney and likely the gastric wall.  There are numerous hepatic metastases, metastatic retroperitoneal and porta hepatis adenopathy as well as peritoneal and omental implants with presumed small volume malignant ascites around the liver.  One of the hepatic lesions would be amendable to ultrasound-guided biopsy to confirm tissue diagnosis.  2. A 1.4 macrolobulated slightly spiculated nodule in the right lower lobe of the lung likely reflects pulmonary metastasis.  Given the slight spiculation, a primary lung malignancy is a less likely possibility.  3.  Cystic structure in the lower pole of the left kidney may reflect a cyst or focal caliectasis from segmental obstruction of the upper pole collecting system.  These results were called by telephone on 05/07/2013 at 03:45 p.m. to Dr. Bethann Berkshire, who verbally acknowledged these results.   Original Report Authenticated By: Malachy Moan, M.D.  Pierre.Alas week]   Impression: 67 year old lady who presented with complaints of diarrhea and jaundice appears to have stage IV metastatic pancreatic adenocarcinoma complicated by biliary obstruction. She has widespread metastases as outlined above. Question arises regarding possibility of biliary stenting. No evidence of cholangitis at this time. Currently patient is on Rocephin. Discussed with Dr. Jena Gauss. Prefer to have pathology available prior to biliary stenting if possible. This will help determine what type of stent she may need but will likely need some sort of metallic stent based on her prognosis. Per Dr. Hettie Holstein Osuorji's recommendation we will go ahead and schedule ultrasound guided liver biopsy for tissue diagnosis.  Plan: ERCP with biliary stenting post tissue sampling/liver biopsy. Continue antibiotics and monitor for signs of cholangitis. To discuss with Dr. Jena Gauss, but consider adding flagyl or  change rocephin to unasyn  for appropriate broad-spectrum coverage.    LOS: 2 days   Tana Coast  05/09/2013, 8:34 AM  Attending note: The patient seen and examined. CT scan reviewed with Dr. Tyron Russell. Agree with oncology, need tissue diagnosis. It appears that the biopsy is now set up for the first of next week. She looks incredibly good for the process depicted on her CT scan. Clinically, no cholangitis.We will  plan for ERCP with biliary decompression just after biopsy performed. If she develops signs or symptoms of cholangitis, we can change the time table.  Dr. Karilyn Cota will look in on her while she is here over the for the weekend. I have discussed the case with Dr. Karilyn Cota; [facility] will see in am and make further recommendations regarding timing of stent placement.

## 2013-05-09 NOTE — Care Management Note (Signed)
    Page 1 of 1   05/13/2013     9:29:22 AM   CARE MANAGEMENT NOTE 05/13/2013  Patient:  [name], [name]   Account Number:  [id number]  Date Initiated:  05/09/2013  Documentation initiated by:  Anibal Henderson  Subjective/Objective Assessment:   patient was admitted with increased bilirubin, and found to have cancerous lesion,  on IV antibiotics. oncology consult G.I. consult. Patient lives at home with family, is independent, and  states she has a large family to help if needed     Action/Plan:   No cm needs identified at present, but will follow   Anticipated DC Date:  05/13/2013   Anticipated DC Plan:  HOME/SELF CARE      DC Planning Services  CM consult      Choice offered to / List presented to:             Status of service:  Completed, signed off Medicare Important Message given?  YES (If response is "NO", the following Medicare IM given date fields will be blank) Date Medicare IM given:  05/13/2013 Date Additional Medicare IM given:    Discharge Disposition:  HOME/SELF CARE  Per UR Regulation:  Reviewed for med. necessity/level of care/duration of stay  If discussed at Long Length of Stay Meetings, dates discussed:    Comments:  05/13/13 0920 Anibal Henderson RN Pt had Bx at [facility] yesterday and is being D/C home today to await Dx and will follow with Dr [name] and oncology 05/09/13 1130 Anibal Henderson RN

## 2013-05-09 NOTE — Progress Notes (Signed)
Patient ID: [name], female   DOB: 06/30/1946, 67 y.o.   MRN: [id number] Request received for US guided liver lesion biopsy on pt . Imaging studies were reviewed by Dr. Miles Costain. Will plan to do case on 6/16 at [facility]. Pt due to arrive at 1200. She will return to [facility] after biopsy. Should pt be d/c'd home prior to 6/16 please contact IR service at [phone]. Nurse aware of plans.

## 2013-05-09 NOTE — Progress Notes (Signed)
[name], [name]                ACCOUNT NO.:  [id number]  MEDICAL RECORD NO.:  [id number]  LOCATION:  A226                          FACILITY:  [facility]  PHYSICIAN:  Caedin Mogan G. Renard Matter, MD   DATE OF BIRTH:  07/28/1946  DATE OF PROCEDURE: DATE OF DISCHARGE:                                PROGRESS NOTE   SUBJECTIVE:  This patient is fairly comfortable this morning.  She was admitted through the emergency room for upper abdominal pain and diarrhea and had a history of 30-40 pounds weight loss over the past few months, and evidence of dark urine.  A CT scan through the emergency department was completed and showed evidence of pancreatic adenocarcinoma complicated by biliary obstructions and evidence of pulmonary metastasis and numerous lesions in left and right lobe of the liver.  The patient also had evidence of impaired liver panel.  The patient also had elevated blood sugars.  She was admitted with these problems and feels fairly comfortable this morning.  OBJECTIVE:  VITAL SIGNS:  Blood pressure 151/70, respirations 20, pulse 68, temperature 98.2. HEENT:  Eyes, PERRLA.  TM negative.  Oropharynx benign. NECK:  Supple.  No JVD or thyroid abnormalities. HEART:  Regular rhythm.  No murmurs. LUNGS:  Clear to P and A. ABDOMEN:  No palpable organs or masses.  No organomegaly. EXTREMITIES:  Free of edema.  ASSESSMENT:  Pancreatic mass.  Probable adenocarcinoma with metastasis widespread to liver and lungs; diabetes mellitus type 2; and hypokalemia.  PLAN:  To continue IV Rocephin.  Continue IV fluids.  We will obtain [facility] consult.     [name] G. Renard Matter, MD     AGM/MEDQ  D:  05/08/2013  T:  05/09/2013  Job:  [id number]

## 2013-05-09 NOTE — Progress Notes (Signed)
DR [facility] CALLED TO SAY THAT HE WILL BE IN IN THE MORNING TO ASSESS PT FOR POSSIBLE SHUNT.

## 2013-05-10 ENCOUNTER — Encounter (HOSPITAL_COMMUNITY)
Admission: EM | Disposition: A | Discharge status: Home / Self Care | Payer: Self-pay | Source: Home / Self Care | Attending: Family Medicine

## 2013-05-10 ENCOUNTER — Inpatient Hospital Stay (HOSPITAL_COMMUNITY): Payer: Medicare Other

## 2013-05-10 ENCOUNTER — Inpatient Hospital Stay (HOSPITAL_COMMUNITY): Payer: Medicare Other | Admitting: Anesthesiology

## 2013-05-10 ENCOUNTER — Other Ambulatory Visit: Payer: Self-pay

## 2013-05-10 ENCOUNTER — Encounter (HOSPITAL_COMMUNITY): Payer: Self-pay | Admitting: Anesthesiology

## 2013-05-10 DIAGNOSIS — R17 Unspecified jaundice: Secondary | ICD-10-CM

## 2013-05-10 DIAGNOSIS — C50919 Malignant neoplasm of unspecified site of unspecified female breast: Secondary | ICD-10-CM

## 2013-05-10 DIAGNOSIS — C259 Malignant neoplasm of pancreas, unspecified: Secondary | ICD-10-CM

## 2013-05-10 DIAGNOSIS — K831 Obstruction of bile duct: Secondary | ICD-10-CM

## 2013-05-10 DIAGNOSIS — C779 Secondary and unspecified malignant neoplasm of lymph node, unspecified: Secondary | ICD-10-CM

## 2013-05-10 HISTORY — PX: BILIARY STENT PLACEMENT: SHX5538

## 2013-05-10 HISTORY — PX: ERCP: SHX5425

## 2013-05-10 HISTORY — PX: SPHINCTEROTOMY: SHX5544

## 2013-05-10 LAB — POTASSIUM: Potassium: 3.6 mEq/L (ref 3.5–5.1)

## 2013-05-10 LAB — GLUCOSE, CAPILLARY
Glucose-Capillary: 277 mg/dL — ABNORMAL HIGH (ref 70–99)
Glucose-Capillary: 281 mg/dL — ABNORMAL HIGH (ref 70–99)

## 2013-05-10 LAB — CEA: CEA: 390.9 ng/mL — ABNORMAL HIGH (ref 0.0–5.0)

## 2013-05-10 LAB — HEPATIC FUNCTION PANEL
ALT: 112 U/L — ABNORMAL HIGH (ref 0–35)
AST: 106 U/L — ABNORMAL HIGH (ref 0–37)
Albumin: 2.2 g/dL — ABNORMAL LOW (ref 3.5–5.2)

## 2013-05-10 SURGERY — ERCP, WITH INTERVENTION IF INDICATED
Anesthesia: General

## 2013-05-10 MED ORDER — ROCURONIUM BROMIDE 100 MG/10ML IV SOLN
INTRAVENOUS | Status: DC | PRN
  Administered 2013-05-10: 5 mg via INTRAVENOUS
  Administered 2013-05-10: 25 mg via INTRAVENOUS

## 2013-05-10 MED ORDER — ONDANSETRON HCL 4 MG/2ML IJ SOLN
INTRAMUSCULAR | Status: AC
  Filled 2013-05-10: qty 2

## 2013-05-10 MED ORDER — ROCURONIUM BROMIDE 50 MG/5ML IV SOLN
INTRAVENOUS | Status: AC
  Filled 2013-05-10: qty 1

## 2013-05-10 MED ORDER — GLYCOPYRROLATE 0.2 MG/ML IJ SOLN
INTRAMUSCULAR | Status: DC | PRN
  Administered 2013-05-10: 0.2 mg via INTRAVENOUS

## 2013-05-10 MED ORDER — MIDAZOLAM HCL 5 MG/5ML IJ SOLN
INTRAMUSCULAR | Status: DC | PRN
  Administered 2013-05-10: 2 mg via INTRAVENOUS

## 2013-05-10 MED ORDER — LIDOCAINE HCL (PF) 1 % IJ SOLN
INTRAMUSCULAR | Status: AC
  Filled 2013-05-10: qty 5

## 2013-05-10 MED ORDER — SUCCINYLCHOLINE CHLORIDE 20 MG/ML IJ SOLN
INTRAMUSCULAR | Status: AC
  Filled 2013-05-10: qty 1

## 2013-05-10 MED ORDER — FENTANYL CITRATE 0.05 MG/ML IJ SOLN
INTRAMUSCULAR | Status: AC
  Filled 2013-05-10: qty 5

## 2013-05-10 MED ORDER — LABETALOL HCL 5 MG/ML IV SOLN
INTRAVENOUS | Status: AC
  Filled 2013-05-10: qty 4

## 2013-05-10 MED ORDER — SUCCINYLCHOLINE CHLORIDE 20 MG/ML IJ SOLN
INTRAMUSCULAR | Status: DC | PRN
  Administered 2013-05-10: 110 mg via INTRAVENOUS

## 2013-05-10 MED ORDER — MIDAZOLAM HCL 2 MG/2ML IJ SOLN
INTRAMUSCULAR | Status: AC
  Filled 2013-05-10: qty 2

## 2013-05-10 MED ORDER — SODIUM CHLORIDE 0.9 % IV SOLN: INTRAVENOUS | Status: DC

## 2013-05-10 MED ORDER — GLUCAGON HCL (RDNA) 1 MG IJ SOLR
INTRAMUSCULAR | Status: DC | PRN
  Administered 2013-05-10 (×3): 0.25 mg via INTRAVENOUS

## 2013-05-10 MED ORDER — GLYCOPYRROLATE 0.2 MG/ML IJ SOLN
INTRAMUSCULAR | Status: AC
  Filled 2013-05-10: qty 1

## 2013-05-10 MED ORDER — PROPOFOL 10 MG/ML IV EMUL
INTRAVENOUS | Status: AC
  Filled 2013-05-10: qty 20

## 2013-05-10 MED ORDER — FENTANYL CITRATE 0.05 MG/ML IJ SOLN
INTRAMUSCULAR | Status: DC | PRN
  Administered 2013-05-10: 50 ug via INTRAVENOUS
  Administered 2013-05-10: 25 ug via INTRAVENOUS
  Administered 2013-05-10: 50 ug via INTRAVENOUS
  Administered 2013-05-10: 25 ug via INTRAVENOUS
  Administered 2013-05-10 (×2): 50 ug via INTRAVENOUS

## 2013-05-10 MED ORDER — LIDOCAINE HCL (CARDIAC) 20 MG/ML IV SOLN
INTRAVENOUS | Status: DC | PRN
  Administered 2013-05-10: 30 mg via INTRAVENOUS

## 2013-05-10 MED ORDER — STERILE WATER FOR IRRIGATION IR SOLN
Status: DC | PRN
  Administered 2013-05-10: 11:00:00

## 2013-05-10 MED ORDER — SODIUM CHLORIDE 0.9 % IV SOLN
INTRAVENOUS | Status: DC | PRN
  Administered 2013-05-10: 11:00:00

## 2013-05-10 MED ORDER — LACTATED RINGERS IV SOLN
INTRAVENOUS | Status: DC | PRN
  Administered 2013-05-10 (×2): via INTRAVENOUS

## 2013-05-10 MED ORDER — PROPOFOL 10 MG/ML IV BOLUS
INTRAVENOUS | Status: DC | PRN
  Administered 2013-05-10: 140 mg via INTRAVENOUS

## 2013-05-10 MED ORDER — ONDANSETRON HCL 4 MG/2ML IJ SOLN
INTRAMUSCULAR | Status: DC | PRN
  Administered 2013-05-10: 4 mg via INTRAVENOUS

## 2013-05-10 SURGICAL SUPPLY — 25 items
BAG HAMPER (MISCELLANEOUS) ×2 IMPLANT
BALLN RETRIEVAL 12X15 (BALLOONS) IMPLANT
BASKET TRAPEZOID 3X6 (MISCELLANEOUS) IMPLANT
DEVICE INFLATION ENCORE 26 (MISCELLANEOUS) IMPLANT
DEVICE LOCKING W-BIOPSY CAP (MISCELLANEOUS) ×2 IMPLANT
GUIDEWIRE HYDRA JAGWIRE .35 (WIRE) ×2 IMPLANT
GUIDEWIRE JAG HINI 025X260CM (WIRE) IMPLANT
KIT ROOM TURNOVER APOR (KITS) ×2 IMPLANT
LUBRICANT JELLY 4.5OZ STERILE (MISCELLANEOUS) ×2 IMPLANT
NEEDLE HYPO 18GX1.5 BLUNT FILL (NEEDLE) IMPLANT
PAD ARMBOARD 7.5X6 YLW CONV (MISCELLANEOUS) ×2 IMPLANT
PATHFINDER 450CM 0.18 (STENTS) IMPLANT
POSITIONER HEAD 8X9X4 ADT (SOFTGOODS) IMPLANT
SNARE ROTATE MED OVAL 20MM (MISCELLANEOUS) IMPLANT
SPHINCTEROTOME AUTOTOME .25 (MISCELLANEOUS) IMPLANT
SPHINCTEROTOME HYDRATOME 44 (MISCELLANEOUS) ×4 IMPLANT
SPONGE GAUZE 4X4 12PLY (GAUZE/BANDAGES/DRESSINGS) ×4 IMPLANT
SYR 3ML LL SCALE MARK (SYRINGE) IMPLANT
SYR 50ML LL SCALE MARK (SYRINGE) ×4 IMPLANT
SYSTEM CONTINUOUS INJECTION (MISCELLANEOUS) ×2 IMPLANT
TRIPLE LUMEN NEEDLE KNIFE XL ×2 IMPLANT
TUBING ENDO SMARTCAP PENTAX (MISCELLANEOUS) ×2 IMPLANT
WALLSTENT METAL COVERED 10X60 (STENTS) IMPLANT
WALLSTENT METAL COVERED 10X80 (STENTS) ×2 IMPLANT
WATER STERILE IRR 1000ML POUR (IV SOLUTION) ×4 IMPLANT

## 2013-05-10 NOTE — Progress Notes (Signed)
NAMECLEOTHA, WHALIN                ACCOUNT NO.:  [id number]  MEDICAL RECORD NO.:  [id number]  LOCATION:  APPO                          FACILITY:  [facility]  PHYSICIAN:  Makensie Mulhall G. Renard Matter, MD   DATE OF BIRTH:  11/15/1946  DATE OF PROCEDURE: DATE OF DISCHARGE:                                PROGRESS NOTE   This patient remains fairly comfortable with no abdominal pain.  She has been seen by Gastroenterology Service, and shunt placement has been considered.  She did have a CT scan, which showed evidence of pancreatic adenocarcinoma and pancreatic mass, complicated by biliary obstructions and evidence of pulmonary metastasis and numerous lesions and left and right lobe of the liver with impaired liver panel and elevated blood sugars.  Oncology recommended ultrasound-guided liver biopsy with port placement.  EXAMINATION:  VITAL SIGNS:  Blood pressure 143/83, respirations 16, pulse 64, temp 98.9. HEENT:  Eyes, PERRLA.  Anicteric sclerae.  Oropharynx benign. NECK:  Supple.  No JVD or thyroid abnormalities. HEART:  Regular rhythm.  No murmurs. LUNGS:  Clear to P and A. ABDOMEN:  No palpable organs or masses. NEUROLOGIC:  No focal abnormalities.  ASSESSMENT:  The patient does have a pancreatic mass, probable adenocarcinoma with metastasis to liver and lungs.  He does have type 2 diabetes and recent hypokalemia.  PLAN:  To continue her IV Rocephin.  Continue IV fluids.  The patient will be seen by Gastroenterology Service for shunt placement, which will probably be done today.     Antonin Meininger G. Renard Matter, MD     AGM/MEDQ  D:  05/10/2013  T:  05/10/2013  Job:  [id number]

## 2013-05-10 NOTE — Progress Notes (Signed)
Swallowing without difficulty. 

## 2013-05-10 NOTE — Anesthesia Postprocedure Evaluation (Addendum)
  Anesthesia Post-op Note  Patient: [name]  Procedure(s) Performed: Procedure(s): ENDOSCOPIC RETROGRADE CHOLANGIOPANCREATOGRAPHY (ERCP)(DIFFICULT CANNULATION)  BILIARY STENT PLACEMENT ( X WALLFLEX STENT) SPHINCTEROTOMY (PRECUT MADE WITH NEEDLE KNIFE)  Patient Location: PACU  Anesthesia Type:General  Level of Consciousness: awake, alert  and oriented  Airway and Oxygen Therapy: Patient Spontanous Breathing and Patient connected to face mask oxygen  Post-op Pain: none  Post-op Assessment: Post-op Vital signs reviewed, Patient's Cardiovascular Status Stable, Respiratory Function Stable, Patent Airway and No signs of Nausea or vomiting  Post-op Vital Signs: Reviewed and stable  Complications: No apparent anesthesia complications 05/12/13 Patient doing well, VSS.  Denies recall or PONV.  No apparent anesthesia complications.

## 2013-05-10 NOTE — Progress Notes (Signed)
Dr Karilyn Cota notified of 296 fingerstick blood sugar. Instructed to use sliding scale.

## 2013-05-10 NOTE — Progress Notes (Signed)
Dentures returned to pt. 3 gold rings returned to pt.

## 2013-05-10 NOTE — Op Note (Signed)
NAMESOPHIAMARIE, NEASE                ACCOUNT NO.:  [id number]  MEDICAL RECORD NO.:  [id number]  LOCATION:  A226                          FACILITY:  [facility]  PHYSICIAN:  Lionel December, M.D.    DATE OF BIRTH:  04/30/1946  DATE OF PROCEDURE:  05/10/2013 DATE OF DISCHARGE:                              OPERATIVE REPORT   PROCEDURE:  ERCP with biliary sphincterotomy and placement of WallFlex Fully cobiliary stent.  INDICATION:  The patient is a 67 year old Afro-American female, who presents with obstructive jaundice.  On workup, she is found to have metastatic disease suspicious for pancreatic carcinoma.  Her CA 19-9 is greater than 140,000.  Her bilirubin is staying above 14 g and unlikely that it will decrease spontaneous.  She is therefore undergoing therapeutic ERCP.  Since patient does not have resectable disease, I would proceed with placement of Wall stent rather than placing a plastic stent.  The patient is agreeable.  MEDICATIONS FOR SEDATION/ANESTHESIA:  Please see anesthesia records for details.  FINDINGS:  Procedure performed in the OR.  After induction of general endotracheal anesthesia, patient was turned and placed in semiprone position.  Some difficulty encountered in passing video duodenoscope. The hypopharynx and esophagus and stomach were examined with Q scope and 0.35 guidewire left in place to facilitate passage of Pentax duodenoscope.  Once this was accomplished, the scope was passed across the normal-appearing pylorus into the descending duodenum.  Ampulla of Vater appeared to be normal with fairly long intramural segment. Cannulation attempted with RX 44 Autotome and Hydra Jagwire.  Difficult cannulation because of biliary obstruction.  Small free cut was made above ampullary orifice, but this did not help.  CBD was finally cannulated through ampullary orifice.  Sphincterotomy was performed and extended to the level of precut.  Biliary system was partially  filled with dilute contrast.  Distal CBD and CHD were markedly dilated with distal stricture.  Stricture was estimated to be at least 3 cm long. Intrahepatic biliary radicals were also dilated.  A 10 mm x 80 mm fully covered WallFlex biliary stent was advanced over the guidewire and slowly released.  Proximal margin was well above the proximal margin of the stricture.  Distal segment was in duodenum for at least four domains.  Endoscope was withdrawn.  The patient was extubated, taken to PACU.  She tolerated the procedure well.  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  Biliary sphincterotomy performed and fully covered 10 mm x 80 mm WallFlex biliary stent placed for decompression.  RECOMMENDATIONS:  Clear liquids today.  No aspirin or Lovenox for 72 hours.  We will obtain KUB in supine position.  Lab studies will be repeated in a.m.  Patient will have ultrasound biopsy on May 12, 2013 as planned previous.          ______________________________ Lionel December, M.D.     NR/MEDQ  D:  05/10/2013  T:  05/10/2013  Job:  [id number]  cc:   R. Roetta Sessions, MD FACP [facility] [address] [place] [address]

## 2013-05-10 NOTE — Progress Notes (Signed)
Needs to void. Ambulated to BR. Voided without difficulty.

## 2013-05-10 NOTE — Progress Notes (Signed)
Flat KUB xray done.

## 2013-05-10 NOTE — Progress Notes (Signed)
Subjective; Patient complains of mild right upper quadrant pain. She denies nausea vomiting or fever. She has had chills and night sweats for several weeks. Patient states her symptoms started on second of this month when she experienced right upper quadrant pain. She noted change in the color of urine last week. She also has had pruritus for one week. She has had intermittent diarrhea. She denies rectal bleeding or melena. She says she's been going to the gym 5 times a week for 3 years and has lost 40 pounds which she believes is voluntary. She's been diabetic for 10 years. She lost one brother of bone cancer at age 51. Objective; BP 148/63  Pulse 64  Temp(Src) 98.9 F (37.2 C) (Oral)  Resp 16  Ht 5\' 6"  (1.676 m)  Wt 154 lb 8.7 oz (70.1 kg)  BMI 24.96 kg/m2  SpO2 100% Patient is deeply jaundiced. Abdomen is soft with palpable liver. No tenderness or masses noted. No LE edema. Lab data; CA 19-9 is greater than 140,000. CEA 390.9 Total bilirubin 14.4 direct 11.0. AP 359, AST 106 and ALT 112 and albumin 2.2 Abdominopelvic CT reviewed; large mass in pancreatic tail along with liver metastases and retroperitoneal and porta hepatis adenopathy as well as peritoneal and omental metastases; small perihepatic ascites. Assessment; Pancreatic neoplasm with hepatic; the mental and peritoneal metastases as well as adenopathy with bile duct obstruction. CA 19-9 is very high and would favor pancreatic adenocarcinoma. Patient is scheduled for biopsy in two days. Biliary system needs to be decompressed; do not see any benefit in waiting until after the biopsy. Since patient does not have resectable disease may proceed with placement of wall stent. Procedure and risks reviewed with the patient and she is agreeable. Patient's last dose of Lovenox was over 15 hours ago. Recommendations; Will proceed with ERCP with the restenting today in OR.

## 2013-05-10 NOTE — Anesthesia Procedure Notes (Signed)
Procedure Name: Intubation Date/Time: 05/10/2013 10:42 AM Performed by: Glynn Octave E Pre-anesthesia Checklist: Patient identified, Patient being monitored, Timeout performed, Emergency Drugs available and Suction available Patient Re-evaluated:Patient Re-evaluated prior to inductionOxygen Delivery Method: Circle System Utilized Preoxygenation: Pre-oxygenation with 100% oxygen Intubation Type: IV induction, Rapid sequence and Cricoid Pressure applied Laryngoscope Size: Mac and 3 Grade View: Grade I Tube type: Oral Tube size: 7.0 mm Number of attempts: 1 Airway Equipment and Method: stylet Placement Confirmation: ETT inserted through vocal cords under direct vision,  positive ETCO2 and breath sounds checked- equal and bilateral Secured at: 21 cm Tube secured with: Tape Dental Injury: Teeth and Oropharynx as per pre-operative assessment

## 2013-05-10 NOTE — Brief Op Note (Signed)
05/07/2013 - 05/10/2013  12:41 PM  PATIENT:  [name]  67 y.o. female  PRE-OPERATIVE DIAGNOSIS:  Obstructive jaundice  POST-OPERATIVE DIAGNOSIS:  Obstructive jaundice  PROCEDURE:  Procedure(s): ENDOSCOPIC RETROGRADE CHOLANGIOPANCREATOGRAPHY (ERCP)  BILIARY STENT PLACEMENT SPHINCTEROTOMY  SURGEON:  Surgeon(s) and Role:    * Malissa Hippo, MD - Primary  Brief op note. Difficult oropharyngeal intubation. Hypopharynx and esophagus and stomach examined with Q. Scope. Normal ampulla of Vater. Difficult cannulation of CBD. Precut made superior to ampullary orifice. CBD cannulated with RX- 44 autotome and 035 hydrogel wire. Sphincterotomy performed. 80 mm x 10 mm fully covered very Wallstent placed for decompression. Patient tolerated procedure well.

## 2013-05-10 NOTE — Transfer of Care (Signed)
Immediate Anesthesia Transfer of Care Note  Patient: [name]  Procedure(s) Performed: Procedure(s): ENDOSCOPIC RETROGRADE CHOLANGIOPANCREATOGRAPHY (ERCP)(DIFFICULT CANNULATION)  BILIARY STENT PLACEMENT ( X WALLFLEX STENT) SPHINCTEROTOMY (PRECUT MADE WITH NEEDLE KNIFE)  Patient Location: PACU  Anesthesia Type:General  Level of Consciousness: awake, alert  and oriented  Airway & Oxygen Therapy: Patient Spontanous Breathing and Patient connected to face mask oxygen  Post-op Assessment: Report given to PACU RN  Post vital signs: Reviewed and stable  Complications: No apparent anesthesia complications

## 2013-05-10 NOTE — Anesthesia Preprocedure Evaluation (Signed)
Anesthesia Evaluation  Patient identified by MRN, date of birth, ID band Patient awake  General Assessment Comment:One bite of food this am when Dr. Karilyn Cota stopped her.  Between 8 and 830.  Reviewed: Allergy & Precautions, H&P , NPO status , Patient's Chart, lab work & pertinent test results  Airway       Dental   Pulmonary Current Smoker,          Cardiovascular Exercise Tolerance: Good hypertension, Pt. on medications  Patient goes to the gym 5 days a week, treadmill and weights.   Neuro/Psych    GI/Hepatic Jaundiced, denies reflux.   Endo/Other  diabetes, Type 1, Insulin Dependent  Renal/GU      Musculoskeletal   Abdominal   Peds  Hematology   Anesthesia Other Findings   Reproductive/Obstetrics                           Anesthesia Physical Anesthesia Plan  ASA: III and emergent  Anesthesia Plan: General   Post-op Pain Management:    Induction: Intravenous, Rapid sequence and Cricoid pressure planned  Airway Management Planned: Oral ETT  Additional Equipment:   Intra-op Plan:   Post-operative Plan: Extubation in OR  Informed Consent: I have reviewed the patients History and Physical, chart, labs and discussed the procedure including the risks, benefits and alternatives for the proposed anesthesia with the patient or authorized representative who has indicated his/her understanding and acceptance.     Plan Discussed with:   Anesthesia Plan Comments:         Anesthesia Quick Evaluation

## 2013-05-10 NOTE — Progress Notes (Signed)
Transferred to 226 via wheelchair in good condition.

## 2013-05-11 ENCOUNTER — Inpatient Hospital Stay (HOSPITAL_COMMUNITY): Payer: Medicare Other

## 2013-05-11 LAB — COMPREHENSIVE METABOLIC PANEL
Alkaline Phosphatase: 333 U/L — ABNORMAL HIGH (ref 39–117)
BUN: 9 mg/dL (ref 6–23)
Chloride: 102 mEq/L (ref 96–112)
GFR calc Af Amer: >90 mL/min (ref >90)
GFR calc non Af Amer: 86 mL/min — ABNORMAL LOW (ref >90)
Glucose, Bld: 156 mg/dL — ABNORMAL HIGH (ref 70–99)
Potassium: 3.9 mEq/L (ref 3.5–5.1)
Total Bilirubin: 10.2 mg/dL — ABNORMAL HIGH (ref 0.3–1.2)
Total Protein: 6.1 g/dL (ref 6.0–8.3)

## 2013-05-11 LAB — CBC
HCT: 28.6 % — ABNORMAL LOW (ref 36.0–46.0)
Hemoglobin: 9.9 g/dL — ABNORMAL LOW (ref 12.0–15.0)
MCHC: 34.6 g/dL (ref 30.0–36.0)

## 2013-05-11 LAB — GLUCOSE, CAPILLARY
Glucose-Capillary: 177 mg/dL — ABNORMAL HIGH (ref 70–99)
Glucose-Capillary: 175 mg/dL — ABNORMAL HIGH (ref 70–99)
Glucose-Capillary: 158 mg/dL — ABNORMAL HIGH (ref 70–99)

## 2013-05-11 LAB — URINE CULTURE

## 2013-05-11 LAB — AMYLASE: Amylase: 139 U/L — ABNORMAL HIGH (ref 0–105)

## 2013-05-11 NOTE — Progress Notes (Signed)
Plain abdominal film reviewed. Mid segment of stent has still quite narrow. For LFTs in a.m.

## 2013-05-11 NOTE — Progress Notes (Signed)
Subjective; Patient complains of sore throat. She denies abdominal pain nausea or vomiting. She ate most of her breakfast. Objective; BP 168/72  Pulse 66  Temp(Src) 98.4 F (36.9 C) (Oral)  Resp 18  Ht 5\' 6"  (1.676 m)  Wt 159 lb (72.122 kg)  BMI 25.68 kg/m2  SpO2 95% Patient remains jaundiced. Abdomen is soft and nontender with palpable liver. Lab data; WBC 14.9 H&H 9.9 and 28.6 Platelet count 281K. Serum amylase 139 Total bilirubin 10.2 AP 333 AST 71 ALT 94 Albumin 2.1 Assessment; Patient doing well following ERCP within restenting for biliary obstruction secondary to metastatic pancreatic carcinoma. Bilirubin and transaminases are down. Anemia. No evidence of GI bleed. Drop in H&H would appear to be secondary to hydration. Recommendations; US guided Biopsy for tissue diagnosis in A.M. Abdominal film today for stent position and to make sure it has opened up in the middle.

## 2013-05-11 NOTE — Progress Notes (Signed)
Care link called to verify transportation to [facility] for Monday, June 16th to Radiology.  Pick-up will be at 11:00 a.m.

## 2013-05-11 NOTE — Progress Notes (Signed)
[name], [name]                ACCOUNT NO.:  [id number]  MEDICAL RECORD NO.:  [id number]  LOCATION:  A226                          FACILITY:  [facility]  PHYSICIAN:  Charlcie Prisco G. Renard Matter, MD   DATE OF BIRTH:  1946-03-09  DATE OF PROCEDURE: DATE OF DISCHARGE:                                PROGRESS NOTE   SUBJECTIVE:  The patient remains fairly comfortable with minimal abdominal pain.  She did have a stent placement yesterday by Dr. Karilyn Cota. She does have a pancreatic mass, complicated by biliary obstructions and evidence of pulmonary metastasis, numerous lesions in right lobe of the liver with impaired liver panel.  She tolerated the procedure in a satisfactory manner.  OBJECTIVE:  VITAL SIGNS:  Blood pressure 168/72, respirations 18, pulse 66, temp 98.4. HEENT:  Eyes, PERRLA.  TM negative.  Oropharynx benign.  The patient has anicteric sclerae. NECK:  Supple.  No JVD or thyroid abnormalities. HEART:  Regular rhythm.  No murmurs. LUNGS:  Clear to P and A. ABDOMEN:  No palpable organs or masses. NEUROLOGIC:  No focal abnormalities.  Recent labs showed impaired liver function with total bilirubin 10.2, alkaline phosphatase 333, ALT 94, AST is 71, calcium 8.1, glucose 156. CBC; WBC 14900 with hemoglobin 9.9, hematocrit 28.6.  ASSESSMENT:  The patient does have a pancreatic mass with definite metastasis to liver and lungs.  There is some consideration that this could be neuroendocrine type tumor.  She has a 2 diabetes and recent hypokalemia.  PLAN:  To continue current IV Rocephin and IV fluids.  The patient will again be seen by gastroenterologist service today.     Linzey Ramser G. Renard Matter, MD     AGM/MEDQ  D:  05/11/2013  T:  05/11/2013  Job:  [id number]

## 2013-05-12 ENCOUNTER — Inpatient Hospital Stay (HOSPITAL_COMMUNITY)
Admit: 2013-05-12 | Discharge: 2013-05-12 | Disposition: A | Discharge status: Home / Self Care | Payer: Medicare Other | Attending: Family Medicine | Admitting: Family Medicine

## 2013-05-12 ENCOUNTER — Encounter (HOSPITAL_COMMUNITY): Payer: Self-pay

## 2013-05-12 DIAGNOSIS — K869 Disease of pancreas, unspecified: Secondary | ICD-10-CM

## 2013-05-12 HISTORY — PX: LIVER BIOPSY: SHX301

## 2013-05-12 LAB — HEPATIC FUNCTION PANEL
ALT: 76 U/L — ABNORMAL HIGH (ref 0–35)
Alkaline Phosphatase: 306 U/L — ABNORMAL HIGH (ref 39–117)
Indirect Bilirubin: 2.5 mg/dL — ABNORMAL HIGH (ref 0.3–0.9)
Total Bilirubin: 7.8 mg/dL — ABNORMAL HIGH (ref 0.3–1.2)

## 2013-05-12 LAB — CBC
MCH: 29.8 pg (ref 26.0–34.0)
MCHC: 34.6 g/dL (ref 30.0–36.0)
Platelets: 270 10*3/uL (ref 150–400)

## 2013-05-12 LAB — PROTIME-INR: Prothrombin Time: 15.9 seconds — ABNORMAL HIGH (ref 11.6–15.2)

## 2013-05-12 LAB — GLUCOSE, CAPILLARY

## 2013-05-12 MED ORDER — MIDAZOLAM HCL 2 MG/2ML IJ SOLN
INTRAMUSCULAR | Status: AC | PRN
  Administered 2013-05-12 (×2): 1 mg via INTRAVENOUS

## 2013-05-12 MED ORDER — FENTANYL CITRATE 0.05 MG/ML IJ SOLN
INTRAMUSCULAR | Status: AC | PRN
  Administered 2013-05-12 (×2): 50 ug via INTRAVENOUS

## 2013-05-12 NOTE — Procedures (Signed)
Procedure:  Ultrasound guided core biopsy of liver lesion Findings:  Lesion in left lobe sampled with 18 G core biopsy x 3 via 17 G needle.  No immediate complications.

## 2013-05-12 NOTE — H&P (Signed)
Agree 

## 2013-05-12 NOTE — H&P (Signed)
HPI: [name] is an 67 y.o. female with findings of obstructing pancreatic mass and multiple liver lesions. She has undergone biliary stenting and is referred to IR for liver lesion biopsy. PMHx and meds reviewed.  Past Medical History:  Past Medical History  Diagnosis Date  . Diabetes mellitus without complication   . Hypertension   . Anxiety   . Arthritis     Past Surgical History:  Past Surgical History  Procedure Laterality Date  . Abdominal hysterectomy    . Colonoscopy  2006    Dr. Jena Gauss, polyp, benign    Family History:  Family History  Problem Relation Age of Onset  . Colon cancer Neg Hx   . Liver disease Neg Hx   . Bone cancer Brother     Social History:  reports that she has been smoking.  She does not have any smokeless tobacco history on file. She reports that she does not drink alcohol or use illicit drugs.  Allergies: No Known Allergies  Medications: No current facility-administered medications for this encounter. No current outpatient prescriptions on file. Facility-Administered Medications Ordered in Other Encounters: 0.9 % NaCl with KCl 20 mEq/ L  infusion, , Intravenous, Continuous, Erick Blinks, MD, Last Rate: 100 mL/hr at 05/11/13 2042;  acetaminophen (TYLENOL) suppository 650 mg, 650 mg, Rectal, Q6H PRN, Erick Blinks, MD;  acetaminophen (TYLENOL) tablet 650 mg, 650 mg, Oral, Q6H PRN, Erick Blinks, MD ALPRAZolam Prudy Feeler) tablet 1 mg, 1 mg, Oral, TID PRN, Erick Blinks, MD, 1 mg at 05/11/13 2226;  amLODipine (NORVASC) tablet 2.5 mg, 2.5 mg, Oral, Daily, Erick Blinks, MD, 2.5 mg at 05/11/13 1034;  cefTRIAXone (ROCEPHIN) 1 g in dextrose 5 % 50 mL IVPB, 1 g, Intravenous, Q24H, Erick Blinks, MD, 1 g at 05/11/13 2226;  HYDROcodone-acetaminophen (NORCO/VICODIN) 5-325 MG per tablet 1 tablet, 1 tablet, Oral, Q4H PRN, Erick Blinks, MD insulin aspart (novoLOG) injection 0-15 Units, 0-15 Units, Subcutaneous, TID WC, Erick Blinks, MD, 3 Units at  05/11/13 1757;  insulin aspart (novoLOG) injection 0-5 Units, 0-5 Units, Subcutaneous, QHS, Erick Blinks, MD, 2 Units at 05/09/13 2145;  insulin detemir (LEVEMIR) injection 5 Units, 5 Units, Subcutaneous, Daily, Alice Reichert, MD, 5 Units at 05/11/13 1034 losartan (COZAAR) tablet 100 mg, 100 mg, Oral, Daily, Erick Blinks, MD, 100 mg at 05/11/13 1034;  morphine 2 MG/ML injection 2 mg, 2 mg, Intravenous, Q4H PRN, Erick Blinks, MD;  ondansetron (ZOFRAN) injection 4 mg, 4 mg, Intravenous, Q6H PRN, Erick Blinks, MD;  ondansetron (ZOFRAN) tablet 4 mg, 4 mg, Oral, Q6H PRN, Erick Blinks, MD;  sodium chloride 0.9 % injection 3 mL, 3 mL, Intravenous, Q12H, Erick Blinks, MD, 3 mL at 05/08/13 2144   Please HPI for pertinent positives, otherwise complete 10 system ROS negative.  Physical Exam: BP 180/85  Pulse 63  Temp(Src) 98.2 F (36.8 C) (Oral)  Resp 18  SpO2 100% There is no weight on file to calculate BMI.   General Appearance:  Alert, cooperative, no distress, appears stated age  Head:  Normocephalic, without obvious abnormality, atraumatic  ENT: Unremarkable  Neck: Supple, symmetrical, trachea midline  Lungs:   Clear to auscultation bilaterally, no w/r/r, respirations unlabored without use of accessory muscles.  Heart:  Regular rate and rhythm, S1, S2 normal, no murmur, rub or gallop.  Abdomen:   Soft, non-tender, non distended.  Neurologic: Normal affect, no gross deficits.   Results for orders placed during the hospital encounter of 05/07/13 (from the past 48 hour(s))  GLUCOSE, CAPILLARY  Status: Abnormal   Collection Time    05/10/13  1:04 PM      Result Value Range   Glucose-Capillary 296 (*) 70 - 99 mg/dL  GLUCOSE, CAPILLARY     Status: Abnormal   Collection Time    05/10/13  4:48 PM      Result Value Range   Glucose-Capillary 272 (*) 70 - 99 mg/dL   Comment 1 Notify RN     Comment 2 Documented in Chart    GLUCOSE, CAPILLARY     Status: Abnormal   Collection Time     05/10/13  9:22 PM      Result Value Range   Glucose-Capillary 144 (*) 70 - 99 mg/dL   Comment 1 Notify RN     Comment 2 Documented in Chart    CBC     Status: Abnormal   Collection Time    05/11/13  5:25 AM      Result Value Range   WBC 14.9 (*) 4.0 - 10.5 K/uL   RBC 3.32 (*) 3.87 - 5.11 MIL/uL   Hemoglobin 9.9 (*) 12.0 - 15.0 g/dL   HCT 62.9 (*) 52.8 - 41.3 %   MCV 86.1  78.0 - 100.0 fL   MCH 29.8  26.0 - 34.0 pg   MCHC 34.6  30.0 - 36.0 g/dL   RDW 24.4 (*) 01.0 - 27.2 %   Platelets 281  150 - 400 K/uL  COMPREHENSIVE METABOLIC PANEL     Status: Abnormal   Collection Time    05/11/13  5:25 AM      Result Value Range   Sodium 135  135 - 145 mEq/L   Potassium 3.9  3.5 - 5.1 mEq/L   Chloride 102  96 - 112 mEq/L   CO2 24  19 - 32 mEq/L   Glucose, Bld 156 (*) 70 - 99 mg/dL   BUN 9  6 - 23 mg/dL   Creatinine, Ser 5.36  0.50 - 1.10 mg/dL   Calcium 8.1 (*) 8.4 - 10.5 mg/dL   Total Protein 6.1  6.0 - 8.3 g/dL   Albumin 2.1 (*) 3.5 - 5.2 g/dL   AST 71 (*) 0 - 37 U/L   ALT 94 (*) 0 - 35 U/L   Alkaline Phosphatase 333 (*) 39 - 117 U/L   Total Bilirubin 10.2 (*) 0.3 - 1.2 mg/dL   GFR calc non Af Amer 86 (*) >90 mL/min   GFR calc Af Amer >90  >90 mL/min   Comment:            The eGFR has been calculated     using the CKD EPI equation.     This calculation has not been     validated in all clinical     situations.     eGFR's persistently     <90 mL/min signify     possible Chronic Kidney Disease.  AMYLASE     Status: Abnormal   Collection Time    05/11/13  5:25 AM      Result Value Range   Amylase 139 (*) 0 - 105 U/L  GLUCOSE, CAPILLARY     Status: Abnormal   Collection Time    05/11/13  7:42 AM      Result Value Range   Glucose-Capillary 182 (*) 70 - 99 mg/dL   Comment 1 Notify RN     Comment 2 Documented in Chart    GLUCOSE, CAPILLARY     Status: Abnormal  Collection Time    05/11/13 11:18 AM      Result Value Range   Glucose-Capillary 177 (*) 70 - 99 mg/dL    Comment 1 Notify RN     Comment 2 Documented in Chart    GLUCOSE, CAPILLARY     Status: Abnormal   Collection Time    05/11/13  4:11 PM      Result Value Range   Glucose-Capillary 175 (*) 70 - 99 mg/dL   Comment 1 Notify RN     Comment 2 Documented in Chart    GLUCOSE, CAPILLARY     Status: Abnormal   Collection Time    05/11/13  8:48 PM      Result Value Range   Glucose-Capillary 158 (*) 70 - 99 mg/dL   Comment 1 Documented in Chart     Comment 2 Notify RN    CBC     Status: Abnormal   Collection Time    05/12/13  4:58 AM      Result Value Range   WBC 12.4 (*) 4.0 - 10.5 K/uL   RBC 3.15 (*) 3.87 - 5.11 MIL/uL   Hemoglobin 9.4 (*) 12.0 - 15.0 g/dL   HCT 16.1 (*) 09.6 - 04.5 %   MCV 86.3  78.0 - 100.0 fL   MCH 29.8  26.0 - 34.0 pg   MCHC 34.6  30.0 - 36.0 g/dL   RDW 40.9 (*) 81.1 - 91.4 %   Platelets 270  150 - 400 K/uL  PROTIME-INR     Status: Abnormal   Collection Time    05/12/13  4:58 AM      Result Value Range   Prothrombin Time 15.9 (*) 11.6 - 15.2 seconds   INR 1.30  0.00 - 1.49  APTT     Status: None   Collection Time    05/12/13  4:58 AM      Result Value Range   aPTT 30  24 - 37 seconds  HEPATIC FUNCTION PANEL     Status: Abnormal   Collection Time    05/12/13  4:58 AM      Result Value Range   Total Protein 6.2  6.0 - 8.3 g/dL   Albumin 2.2 (*) 3.5 - 5.2 g/dL   AST 49 (*) 0 - 37 U/L   ALT 76 (*) 0 - 35 U/L   Alkaline Phosphatase 306 (*) 39 - 117 U/L   Total Bilirubin 7.8 (*) 0.3 - 1.2 mg/dL   Bilirubin, Direct 5.3 (*) 0.0 - 0.3 mg/dL   Indirect Bilirubin 2.5 (*) 0.3 - 0.9 mg/dL  GLUCOSE, CAPILLARY     Status: Abnormal   Collection Time    05/12/13  7:18 AM      Result Value Range   Glucose-Capillary 147 (*) 70 - 99 mg/dL   Comment 1 Notify RN     Dg Abd 1 View  05/11/2013   *RADIOLOGY REPORT*  Clinical Data: Follow up biliary stent  ABDOMEN - 1 VIEW  Comparison: 05/10/2013  Findings: There is nonspecific nonobstructive bowel gas pattern. There is  a right upper quadrant biliary stent stable in position and configuration.  Moderate stool noted in transverse colon and right colon.  IMPRESSION: Again noted a biliary stent in the right upper abdomen without change in location and configuration.   Original Report Authenticated By: Natasha Mead, M.D.   Dg Abd 1 View  05/10/2013   **ADDENDUM** CREATED: 05/10/2013 14:28:01  Additional two views submitted to exclude  intrahepatic biliary air.  Biliary stent again noted.  There is no suggestion of intrahepatic biliary air.  **END ADDENDUM** SIGNED BY: Natasha Mead, M.D.  05/10/2013   *RADIOLOGY REPORT*  Clinical Data: Post biliary stent placement  ABDOMEN - 1 VIEW  Comparison: None.  Findings: Mild gaseous distended bowel loops in mid abdomen probable mild ileus.  Biliary stent is noted in the right upper quadrant.  IMPRESSION: Mild gaseous distended bowel loops in mid abdomen probable mild ileus.  Biliary stent is noted in the right upper quadrant.   Original Report Authenticated By: Natasha Mead, M.D.   Dg Ercp Biliary & Pancreatic Ducts  05/10/2013   *RADIOLOGY REPORT*  Clinical Data: Pancreatic carcinoma.  Biliary obstruction.  ERCP  Comparison:  None.  Technique:  Multiple spot images obtained with the fluoroscopic device and submitted for interpretation post-procedure.  ERCP was performed by Dr. Karilyn Cota.  Findings: Contrast injection into the distal common bile duct shows a distal stricture with abrupt margins, and marked proximal dilatation of the common hepatic duct and intrahepatic bile ducts. Subsequent images show passage of a guide wire and placement of a wall stent across the distal common bile duct stricture.  There is persistent waist in the internal biliary stent at the stricture site.  IMPRESSION: Distal common bile duct stricture with abrupt margins, suspicious for malignancy.  Successful placement of internal biliary stent across the stricture.  These images were submitted for radiologic interpretation  only. Please see the procedural report for the amount of contrast and the fluoroscopy time utilized.   Original Report Authenticated By: Myles Rosenthal, M.D.    Assessment/Plan Pancreatic mass with liver lesions. For US guided liver lesion biopsy. Explained procedure, risks, complications, use of sedation. Labs reviewed. Consent signed in chart  Brayton El PA-C 05/12/2013, 12:01 PM

## 2013-05-12 NOTE — Progress Notes (Signed)
[name], [name]                ACCOUNT NO.:  [id number]  MEDICAL RECORD NO.:  [id number]  LOCATION:  A226                          FACILITY:  [facility]  PHYSICIAN:  Mila Homer. [name], M.D.DATE OF BIRTH:  12-27-1945  DATE OF PROCEDURE: DATE OF DISCHARGE:                                PROGRESS NOTE   SUBJECTIVE:  This 67 year old was admitted to the hospital with bloating, itching, weight loss, and other symptoms.  Workup so far is consistent with a metastatic pancreatic cancer.  The patient herself feels fairly well today.  She is on IV fluids.  She has been able to eat.  The itching has cleared after a stent was placed.  OBJECTIVE:  VITAL SIGNS:  Temperature is 98.1, pulse 79, respiratory rate 18, blood pressure 154/75, O2 sats 98%. GENERAL:  She is sitting up in a chair.  She is alert and oriented.  Her sensorium is normal.  Her affect is good.  Her granddaughter and great granddaughter are in the room with her. HEART:  Has a regular rhythm with a rate of 70. LUNGS:  Clear throughout, and she is moving air well. ABDOMEN:  Soft without organomegaly or mass or tenderness today on exam.  ASSESSMENT: 1. Presumptive metastatic pancreatic carcinoma. 2. Diabetes. 3. Weight loss over the last 3 or 4 years, which was directly caused     by her cutting back on calories and exercising in the gym almost     daily, 2 hours a day, to try to be healthier.  PLAN:  She is having a biopsy of 1 of her lesions today to be done in [place] at [facility].  She will return here after that.  I discussed the potential diagnosis of pancreatic cancer with her.     Mila Homer. [name], M.D.     SDK/MEDQ  D:  05/12/2013  T:  05/12/2013  Job:  [id number]

## 2013-05-12 NOTE — ED Notes (Signed)
[facility] here to take pt back to [facility].

## 2013-05-12 NOTE — Progress Notes (Signed)
Subjective: Denies nausea, vomiting, diarrhea, abdominal pain. No complaints. NPO for   Objective: Vital signs in last 24 hours: Temp:  [98.1 F (36.7 C)-99.2 F (37.3 C)] 98.1 F (36.7 C) (06/16 0444) Pulse Rate:  [55-79] 79 (06/16 0444) Resp:  [18-20] 18 (06/16 0444) BP: (148-154)/(71-79) 154/75 mmHg (06/16 0444) SpO2:  [98 %-100 %] 98 % (06/16 0444) Last BM Date: 05/11/13 (per pt.) General:   Alert and oriented, pleasant Head:  Normocephalic and atraumatic. Eyes:  +icterus Heart:  S1, S2 present, no murmurs noted.  Lungs: Clear to auscultation bilaterally, without wheezing, rales, or rhonchi.  Abdomen:  Bowel sounds present, soft, non-tender, non-distended.  Msk:  Symmetrical without gross deformities. Normal posture. Extremities:  Without clubbing or edema. Neurologic:  Alert and  oriented x4;  grossly normal neurologically. Psych:  Alert and cooperative. Normal mood and affect.  Intake/Output from previous day: 06/15 0701 - 06/16 0700 In: 3250 [P.O.:1000; I.V.:2200; IV Piggyback:50] Out: -  Intake/Output this shift:    Lab Results:  Recent Labs  05/11/13 0525 05/12/13 0458  WBC 14.9* 12.4*  HGB 9.9* 9.4*  HCT 28.6* 27.2*  PLT 281 270   BMET  Recent Labs  05/10/13 0521 05/11/13 0525  NA  --  135  K 3.6 3.9  CL  --  102  CO2  --  24  GLUCOSE  --  156*  BUN  --  9  CREATININE  --  0.79  CALCIUM  --  8.1*   LFT  Recent Labs  05/10/13 0521 05/11/13 0525 05/12/13 0458  PROT 6.1 6.1 6.2  ALBUMIN 2.2* 2.1* 2.2*  AST 106* 71* 49*  ALT 112* 94* 76*  ALKPHOS 359* 333* 306*  BILITOT 14.4* 10.2* 7.8*  BILIDIR 11.0*  --  5.3*  IBILI 3.4*  --  2.5*   PT/INR  Recent Labs  05/12/13 0458  LABPROT 15.9*  INR 1.30     Studies/Results: Dg Abd 1 View  05/11/2013   *RADIOLOGY REPORT*  Clinical Data: Follow up biliary stent  ABDOMEN - 1 VIEW  Comparison: 05/10/2013  Findings: There is nonspecific nonobstructive bowel gas pattern. There is a right  upper quadrant biliary stent stable in position and configuration.  Moderate stool noted in transverse colon and right colon.  IMPRESSION: Again noted a biliary stent in the right upper abdomen without change in location and configuration.   Original Report Authenticated By: Natasha Mead, M.D.   Dg Abd 1 View  05/10/2013   **ADDENDUM** CREATED: 05/10/2013 14:28:01  Additional two views submitted to exclude  intrahepatic biliary air.  Biliary stent again noted.  There is no suggestion of intrahepatic biliary air.  **END ADDENDUM** SIGNED BY: Natasha Mead, M.D.  05/10/2013   *RADIOLOGY REPORT*  Clinical Data: Post biliary stent placement  ABDOMEN - 1 VIEW  Comparison: None.  Findings: Mild gaseous distended bowel loops in mid abdomen probable mild ileus.  Biliary stent is noted in the right upper quadrant.  IMPRESSION: Mild gaseous distended bowel loops in mid abdomen probable mild ileus.  Biliary stent is noted in the right upper quadrant.   Original Report Authenticated By: Natasha Mead, M.D.   Dg Ercp Biliary & Pancreatic Ducts  05/10/2013   *RADIOLOGY REPORT*  Clinical Data: Pancreatic carcinoma.  Biliary obstruction.  ERCP  Comparison:  None.  Technique:  Multiple spot images obtained with the fluoroscopic device and submitted for interpretation post-procedure.  ERCP was performed by Dr. Karilyn Cota.  Findings: Contrast injection into the distal common  bile duct shows a distal stricture with abrupt margins, and marked proximal dilatation of the common hepatic duct and intrahepatic bile ducts. Subsequent images show passage of a guide wire and placement of a wall stent across the distal common bile duct stricture.  There is persistent waist in the internal biliary stent at the stricture site.  IMPRESSION: Distal common bile duct stricture with abrupt margins, suspicious for malignancy.  Successful placement of internal biliary stent across the stricture.  These images were submitted for radiologic interpretation only.  Please see the procedural report for the amount of contrast and the fluoroscopy time utilized.   Original Report Authenticated By: Myles Rosenthal, M.D.    Assessment: 67 year old female presenting with obstructive jaundice secondary to high-grade stricture of distal CBD caused by metastatic disease concerning for pancreatic carcinoma. ERCP with biliary sphincterotomy and stent placement for decompression. LFTs continue to improve; patient is scheduled for US guided biopsy for tissue diagnosis today.  As of note, Hgb trending down but no evidence of overt GI bleeding; likely mutlifactorial, hemodiultional component.     Plan: US-guided biopsy today Remain NPO Follow-up with oncology as outpatient Supportive measures   Nira Retort, ANP-BC [facility]  8:10 AM   LOS: 5 days    05/12/2013, 7:59 AM

## 2013-05-13 LAB — GLUCOSE, CAPILLARY
Glucose-Capillary: 157 mg/dL — ABNORMAL HIGH (ref 70–99)
Glucose-Capillary: 162 mg/dL — ABNORMAL HIGH (ref 70–99)
Glucose-Capillary: 225 mg/dL — ABNORMAL HIGH (ref 70–99)
Glucose-Capillary: 265 mg/dL — ABNORMAL HIGH (ref 70–99)

## 2013-05-13 NOTE — Discharge Summary (Deleted)
[name], [name]                ACCOUNT NO.:  [id number]  MEDICAL RECORD NO.:  [id number]  LOCATION:  U                            FACILITY:  [facility]  PHYSICIAN:  Mila Homer. [name], M.D.DATE OF BIRTH:  [birth year]  DATE OF ADMISSION:  05/12/2013 DATE OF DISCHARGE:  06/16/2014LH                              DISCHARGE SUMMARY   This 67 year old was admitted to the hospital with weakness and abdominal pain, diarrhea and some night sweats.  She was found to have what was felt to be presumptive metastatic pancreatic carcinoma.  She was admitted on 05/07/2013 and discharged 05/13/2013, much improved.  Her admission white cell count was 11,300 of which 74% neutrophils.  Her admission sodium was 133, potassium 3.2, chloride 95.  Her glucose 356, alk phos 429, albumin 2.8, AST 92, ALT 123.  Potassium came up to 3.6 with treatment.  Her white cell count hovered in the 10-12,000 range.  UA showed a glucose greater than 1000 on admission with many bacteria. Urine culture revealed 70,000 colonies of a coag negative staph species. PCI checking for staph and MRSA was negative.  Admission chest x-ray showed a 1 cm nodule in the right lung base and mild thoracic kyphosis.  This nodule was felt to be compatible with pulmonary metastatic disease.  CT scan of the abdomen and pelvis showed what appeared to be advanced stage IV metastatic pancreatic adenocarcinoma complicated by malignant biliary obstruction.  There was a 7.2 cm mass in the pancreatic tail which seemed to involve the spleen, kidney and gastric wall and there were numerous hepatic metastases.  She also had a 1.4 macro lobulated slightly spiculated nodule in the right lower lobe of the lung.  She also had a cystic structure in the lower pole of the left kidney.  ERCP showed a distal common bile duct stricture with abrupt margins, was felt to be secondary malignancy.  A stent was placed by Dr. Karilyn Cota.  She was admitted to the  hospital, put on IV fluids.  She was on sliding scale insulin while in the hospital.  She was continued on amlodipine and alprazolam.  Aspirin was discontinued.  Consultations were obtained from GI and Oncology.  She was given ceftriaxone IV.  She gradually improved.  After stent was placed her appetite improved and she was able to eat and drink and really felt much better, back to normal.  The day before discharge a biopsy of the liver met was done by Dr. Fredia Sorrow at [facility] under ultrasound guidance.  She reached maximum benefit of hospitalization by her 7th day and was ready for discharge home. 1. At the time of discharge she was continued on alprazolam 1 mg     t.i.d. p.r.n. 2. Amlodipine 2.5 mg daily. 3. Loperamide 2 mg up to q.i.d. 4. Losartan 100 mg daily. 5. Metformin 500 mg daily. 6. I discontinued aspirin 81 mg, hydrochlorothiazide 25 mg, potassium     chloride 10 mEq and Simvastatin 20 mg.  I recommended she check her blood pressure and her sugar at home and if her blood pressure remained above 140 to start back on hydrochlorothiazide and the potassium.  She will be followed  up with Oncology outpatient after the biopsy results are back and follow up with me in the office within the week.  FINAL DISCHARGE DIAGNOSES: 1. Presumptive pancreatic cancer metastatic to various organs of the     abdomen and also to the lung. 2. Long-term type 2 diabetes mellitus. 3. Benign essential hypertension. 4. Anxiety.     Mila Homer. [name], M.D.     SDK/MEDQ  D:  05/13/2013  T:  05/13/2013  Job:  [id number]

## 2013-05-13 NOTE — Progress Notes (Signed)
Patient discharging home, follow up appts in place with PCP and oncology.  IV removed - WNL.  Instructed patient on which medications to stop taking at home, now new ones added to regimen.  Pt encouraged and given info on smoking cessation. Education given on pancreatic CA with carenotes. Patient verbalizes understanding of instrutions, has no questions at this time.  Pt left ambulatory with assistance of NT in stable condition.

## 2013-05-13 NOTE — Discharge Summary (Signed)
[name], [name]                ACCOUNT NO.:  [id number]  MEDICAL RECORD NO.:  [id number]  LOCATION:  U                            FACILITY:  [facility]  PHYSICIAN:  Mila Homer. [name], M.D.DATE OF BIRTH:  1946-02-12  DATE OF ADMISSION:  05/12/2013 DATE OF DISCHARGE:  06/16/2014LH                              DISCHARGE SUMMARY   This 67 year old was admitted to the hospital with weakness and abdominal pain, diarrhea and some night sweats.  She was found to have what was felt to be presumptive metastatic pancreatic carcinoma.  She was admitted on 05/07/2013 and discharged 05/13/2013, much improved.  Her admission white cell count was 11,300 of which 74% neutrophils.  Her admission sodium was 133, potassium 3.2, chloride 95.  Her glucose 356, alk phos 429, albumin 2.8, AST 92, ALT 123.  Potassium came up to 3.6 with treatment.  Her white cell count hovered in the 10-12,000 range.  UA showed a glucose greater than 1000 on admission with many bacteria. Urine culture revealed 70,000 colonies of a coag negative staph species. PCI checking for staph and MRSA was negative.  Admission chest x-ray showed a 1 cm nodule in the right lung base and mild thoracic kyphosis.  This nodule was felt to be compatible with pulmonary metastatic disease.  CT scan of the abdomen and pelvis showed what appeared to be advanced stage IV metastatic pancreatic adenocarcinoma complicated by malignant biliary obstruction.  There was a 7.2 cm mass in the pancreatic tail which seemed to involve the spleen, kidney and gastric wall and there were numerous hepatic metastases.  She also had a 1.4 cm lobulated slightly spiculated nodule in the right lower lobe of the lung.  She also had a cystic structure in the lower pole of the left kidney.  ERCP showed a distal common bile duct stricture with abrupt margins, was felt to be secondary malignancy.  A stent was placed by Dr. Karilyn Cota.  She was admitted to the  hospital, put on IV fluids.  She was on sliding scale insulin while in the hospital.  She was continued on amlodipine and alprazolam.  Aspirin was discontinued.  Consultations were obtained from GI and oncology.  She was given ceftriaxone IV.  She gradually improved.  After the stent was placed her appetite improved and she was able to eat and drink and really felt much better, back to normal.  The day before discharge a biopsy of the lung metastasis was done by Dr. Fredia Sorrow at [facility] under ultrasound guidance.  She reached maximum benefit of hospitalization by her 7th day and was ready for discharge home. 1. At the time of discharge she was continued on alprazolam 1 mg     t.i.d. p.r.n. 2. Amlodipine 2.5 mg daily. 3. Loperamide 2 mg up to q.i.d. 4. Losartan 100 mg daily. 5. Metformin 500 mg daily. 6. I discontinued aspirin 81 mg, hydrochlorothiazide 25 mg, potassium     chloride 10 mEq and Simvastatin 20 mg.  I recommended she check her blood pressure and her sugar at home and if her blood pressure remained above 140 to start back on hydrochlorothiazide and the potassium.  She will be  followed up with oncology outpatient after the biopsy results are back and follow up with me in the office within the week.  FINAL DISCHARGE DIAGNOSES: 1. Presumptive pancreatic cancer metastatic to various organs of the     abdomen and also to the lung. 2. Long-term type 2 diabetes mellitus. 3. Benign essential hypertension. 4. Anxiety.     Mila Homer. [name], M.D.     SDK/MEDQ  D:  05/13/2013  T:  05/13/2013  Job:  [id number]

## 2013-05-19 ENCOUNTER — Encounter (HOSPITAL_COMMUNITY): Payer: Self-pay | Admitting: Internal Medicine

## 2013-05-21 ENCOUNTER — Inpatient Hospital Stay (HOSPITAL_COMMUNITY)
Admission: EM | Admit: 2013-05-21 | Discharge: 2013-05-23 | DRG: 638 | Disposition: A | Discharge status: Home / Self Care | Payer: Medicare Other | Attending: Family Medicine | Admitting: Family Medicine

## 2013-05-21 ENCOUNTER — Encounter (HOSPITAL_COMMUNITY): Payer: Self-pay | Admitting: Emergency Medicine

## 2013-05-21 ENCOUNTER — Other Ambulatory Visit: Payer: Self-pay

## 2013-05-21 ENCOUNTER — Encounter (HOSPITAL_COMMUNITY): Payer: Medicare Other | Attending: Oncology | Admitting: Oncology

## 2013-05-21 ENCOUNTER — Encounter (HOSPITAL_COMMUNITY): Payer: Self-pay | Admitting: Oncology

## 2013-05-21 VITALS — BP 147/94 | HR 96 | Temp 99.2°F | Resp 20 | Ht 63.0 in | Wt 149.5 lb

## 2013-05-21 DIAGNOSIS — IMO0001 Reserved for inherently not codable concepts without codable children: Principal | ICD-10-CM | POA: Diagnosis present

## 2013-05-21 DIAGNOSIS — R739 Hyperglycemia, unspecified: Secondary | ICD-10-CM

## 2013-05-21 DIAGNOSIS — C787 Secondary malignant neoplasm of liver and intrahepatic bile duct: Secondary | ICD-10-CM | POA: Diagnosis present

## 2013-05-21 DIAGNOSIS — F411 Generalized anxiety disorder: Secondary | ICD-10-CM | POA: Diagnosis present

## 2013-05-21 DIAGNOSIS — M129 Arthropathy, unspecified: Secondary | ICD-10-CM | POA: Diagnosis present

## 2013-05-21 DIAGNOSIS — E119 Type 2 diabetes mellitus without complications: Secondary | ICD-10-CM | POA: Insufficient documentation

## 2013-05-21 DIAGNOSIS — E860 Dehydration: Secondary | ICD-10-CM | POA: Diagnosis present

## 2013-05-21 DIAGNOSIS — C259 Malignant neoplasm of pancreas, unspecified: Secondary | ICD-10-CM | POA: Diagnosis present

## 2013-05-21 DIAGNOSIS — K8689 Other specified diseases of pancreas: Secondary | ICD-10-CM

## 2013-05-21 DIAGNOSIS — K838 Other specified diseases of biliary tract: Secondary | ICD-10-CM | POA: Diagnosis present

## 2013-05-21 DIAGNOSIS — Z833 Family history of diabetes mellitus: Secondary | ICD-10-CM

## 2013-05-21 DIAGNOSIS — C7800 Secondary malignant neoplasm of unspecified lung: Secondary | ICD-10-CM | POA: Diagnosis present

## 2013-05-21 DIAGNOSIS — Z87891 Personal history of nicotine dependence: Secondary | ICD-10-CM

## 2013-05-21 DIAGNOSIS — IMO0002 Reserved for concepts with insufficient information to code with codable children: Secondary | ICD-10-CM | POA: Diagnosis present

## 2013-05-21 DIAGNOSIS — I10 Essential (primary) hypertension: Secondary | ICD-10-CM | POA: Insufficient documentation

## 2013-05-21 DIAGNOSIS — C779 Secondary and unspecified malignant neoplasm of lymph node, unspecified: Secondary | ICD-10-CM | POA: Insufficient documentation

## 2013-05-21 DIAGNOSIS — E1165 Type 2 diabetes mellitus with hyperglycemia: Secondary | ICD-10-CM

## 2013-05-21 DIAGNOSIS — Z794 Long term (current) use of insulin: Secondary | ICD-10-CM

## 2013-05-21 DIAGNOSIS — K831 Obstruction of bile duct: Secondary | ICD-10-CM | POA: Diagnosis present

## 2013-05-21 LAB — COMPREHENSIVE METABOLIC PANEL
Alkaline Phosphatase: 318 U/L — ABNORMAL HIGH (ref 39–117)
BUN: 23 mg/dL (ref 6–23)
Calcium: 9.3 mg/dL (ref 8.4–10.5)
GFR calc Af Amer: 74 mL/min — ABNORMAL LOW (ref >90)
Glucose, Bld: 765 mg/dL (ref 70–99)
Total Protein: 7.8 g/dL (ref 6.0–8.3)

## 2013-05-21 LAB — URINE MICROSCOPIC-ADD ON

## 2013-05-21 LAB — URINALYSIS, ROUTINE W REFLEX MICROSCOPIC
Bilirubin Urine: NEGATIVE
Glucose, UA: >1000 mg/dL — AB
Ketones, ur: NEGATIVE mg/dL
Leukocytes, UA: NEGATIVE
Nitrite: NEGATIVE
Protein, ur: NEGATIVE mg/dL
Specific Gravity, Urine: 1.01 (ref 1.005–1.030)
Urobilinogen, UA: 0.2 mg/dL (ref 0.0–1.0)
pH: 6 (ref 5.0–8.0)

## 2013-05-21 LAB — CBC WITH DIFFERENTIAL/PLATELET
Basophils Relative: 0 % (ref 0–1)
Basophils Relative: 1 % (ref 0–1)
Eosinophils Absolute: 0.4 10*3/uL (ref 0.0–0.7)
Eosinophils Absolute: 0.5 10*3/uL (ref 0.0–0.7)
Eosinophils Relative: 3 % (ref 0–5)
HCT: 31.2 % — ABNORMAL LOW (ref 36.0–46.0)
Hemoglobin: 10.4 g/dL — ABNORMAL LOW (ref 12.0–15.0)
MCH: 30.1 pg (ref 26.0–34.0)
MCH: 29.5 pg (ref 26.0–34.0)
MCHC: 33.3 g/dL (ref 30.0–36.0)
MCHC: 33.1 g/dL (ref 30.0–36.0)
MCV: 89 fL (ref 78.0–100.0)
Monocytes Absolute: 0.9 10*3/uL (ref 0.1–1.0)
Monocytes Relative: 9 % (ref 3–12)
Neutro Abs: 10.9 10*3/uL — ABNORMAL HIGH (ref 1.7–7.7)
Neutrophils Relative %: 75 % (ref 43–77)
Platelets: 324 10*3/uL (ref 150–400)

## 2013-05-21 LAB — BASIC METABOLIC PANEL
BUN: 23 mg/dL (ref 6–23)
CO2: 28 mEq/L (ref 19–32)
Calcium: 9.6 mg/dL (ref 8.4–10.5)
Creatinine, Ser: 0.99 mg/dL (ref 0.50–1.10)
GFR calc non Af Amer: 59 mL/min — ABNORMAL LOW (ref >90)
Glucose, Bld: 715 mg/dL (ref 70–99)

## 2013-05-21 LAB — GLUCOSE, CAPILLARY
Glucose-Capillary: 360 mg/dL — ABNORMAL HIGH (ref 70–99)
Glucose-Capillary: 459 mg/dL — ABNORMAL HIGH (ref 70–99)

## 2013-05-21 MED ORDER — SODIUM CHLORIDE 0.9 % IV BOLUS (SEPSIS)
2000.0000 mL | Freq: Once | INTRAVENOUS | Status: AC
  Administered 2013-05-21: 1000 mL via INTRAVENOUS

## 2013-05-21 MED ORDER — INSULIN REGULAR BOLUS VIA INFUSION
0.0000 [IU] | Freq: Three times a day (TID) | INTRAVENOUS | Status: DC
  Filled 2013-05-21: qty 10

## 2013-05-21 MED ORDER — DEXTROSE 50 % IV SOLN: 25.0000 mL | INTRAVENOUS | Status: DC | PRN

## 2013-05-21 MED ORDER — INSULIN REGULAR HUMAN 100 UNIT/ML IJ SOLN
INTRAMUSCULAR | Status: AC
  Filled 2013-05-21: qty 3

## 2013-05-21 MED ORDER — DEXTROSE-NACL 5-0.45 % IV SOLN: INTRAVENOUS | Status: DC

## 2013-05-21 MED ORDER — SODIUM CHLORIDE 0.9 % IV SOLN
INTRAVENOUS | Status: DC
  Administered 2013-05-21: 6 [IU]/h via INTRAVENOUS
  Administered 2013-05-21: 4.5 [IU]/h via INTRAVENOUS
  Filled 2013-05-21: qty 1

## 2013-05-21 MED ORDER — SODIUM CHLORIDE 0.9 % IV SOLN
INTRAVENOUS | Status: DC
  Administered 2013-05-21: via INTRAVENOUS

## 2013-05-21 MED ORDER — SODIUM CHLORIDE 0.9 % IV SOLN
INTRAVENOUS | Status: DC
  Administered 2013-05-21: 1000 mL via INTRAVENOUS

## 2013-05-21 NOTE — ED Provider Notes (Signed)
History    CSN: [id number] Arrival date & time 05/21/13  1818  First MD Initiated Contact with Patient 05/21/13 1847     Chief Complaint  Patient presents with  . Hyperglycemia    HPI Pt was seen at 1925.  Per pt, c/o gradual onset and persistence of "high blood sugars" that began today. Pt states she was called PTA by her Heme/Onc MD after an appointment today and told to come to the ED because her "sugar was high" on labs drawn today.  Pt states her CBG's have been "up and down" for the past week since she was discharged from the hospital for new dx of metastatic pancreatic CA.  Endorses excessive thirst and urination for the past week, as well as generalized fatigue. Denies N/V/D, no fevers, no CP/SOB.    Past Medical History  Diagnosis Date  . Diabetes mellitus without complication   . Hypertension   . Anxiety   . Arthritis   . Pancreatic cancer    Past Surgical History  Procedure Laterality Date  . Abdominal hysterectomy    . Colonoscopy  2006    Dr. Jena Gauss, polyp, benign  . Ercp  05/10/2013    Procedure: ENDOSCOPIC RETROGRADE CHOLANGIOPANCREATOGRAPHY (ERCP)(DIFFICULT CANNULATION) ;  Surgeon: Malissa Hippo, MD;  Location: [facility];  Service: Endoscopy;;  . Biliary stent placement  05/10/2013    Procedure: BILIARY STENT PLACEMENT ( X WALLFLEX STENT);  Surgeon: Malissa Hippo, MD;  Location: [facility];  Service: Endoscopy;;  . Sphincterotomy  05/10/2013    Procedure: SPHINCTEROTOMY (PRECUT MADE WITH NEEDLE KNIFE);  Surgeon: Malissa Hippo, MD;  Location: [facility];  Service: Endoscopy;;  . Liver biopsy  05/12/2013   Family History  Problem Relation Age of Onset  . Colon cancer Neg Hx   . Liver disease Neg Hx   . Bone cancer Brother   . Cancer Brother   . Hypertension Mother    History  Substance Use Topics  . Smoking status: Former Smoker -- 1.00 packs/day for 20 years  . Smokeless tobacco: Former Neurosurgeon    Quit date: 03/27/2013  . Alcohol Use: No   OB History   Grav Para Term Preterm Abortions TAB SAB Ect Mult Living   5 5 5       5      Review of Systems ROS: Statement: All systems negative except as marked or noted in the HPI; Constitutional: Negative for fever and chills. +generalized fatigue.; ; Eyes: Negative for eye pain, redness and discharge. ; ; ENMT: Negative for ear pain, hoarseness, nasal congestion, sinus pressure and sore throat. ; ; Cardiovascular: Negative for chest pain, palpitations, diaphoresis, dyspnea and peripheral edema. ; ; Respiratory: Negative for cough, wheezing and stridor. ; ; Gastrointestinal: Negative for nausea, vomiting, diarrhea, abdominal pain, blood in stool, hematemesis, jaundice and rectal bleeding. . ; ; Genitourinary: Negative for dysuria, flank pain and hematuria. ; ; Musculoskeletal: Negative for back pain and neck pain. Negative for swelling and trauma.; ; Skin: Negative for pruritus, rash, abrasions, blisters, bruising and skin lesion.; ; Neuro: Negative for headache, lightheadedness and neck stiffness. Negative for weakness, altered level of consciousness , altered mental status, extremity weakness, paresthesias, involuntary movement, seizure and syncope.       Allergies  Review of patient's allergies indicates no known allergies.  Home Medications   Current Outpatient Rx  Name  Route  Sig  Dispense  Refill  . ALPRAZolam (XANAX) 1 MG tablet   Oral  Take 1 mg by mouth 3 (three) times daily as needed for sleep or anxiety.         Marland Kitchen amLODipine (NORVASC) 5 MG tablet   Oral   Take 5 mg by mouth daily.         Marland Kitchen losartan (COZAAR) 100 MG tablet   Oral   Take 100 mg by mouth daily.         . metFORMIN (GLUCOPHAGE) 500 MG tablet   Oral   Take 1 tablet (500 mg total) by mouth daily with breakfast.   14 tablet   0    BP 173/79  Pulse 79  Temp(Src) 97.6 F (36.4 C) (Oral)  Resp 20  Ht 5\' 6"  (1.676 m)  Wt 149 lb (67.586 kg)  BMI 24.06 kg/m2  SpO2 96%  Physical Exam 1930: Physical  examination:  Nursing notes reviewed; Vital signs and O2 SAT reviewed;  Constitutional: Well developed, Well nourished, Well hydrated, In no acute distress; Head:  Normocephalic, atraumatic; Eyes: EOMI, PERRL, +scleral icterus; ENMT: Mouth and pharynx normal, Mucous membranes moist; Neck: Supple, Full range of motion, No lymphadenopathy; Cardiovascular: Regular rate and rhythm, No gallop; Respiratory: Breath sounds clear & equal bilaterally, No rales, rhonchi, wheezes.  Speaking full sentences with ease, Normal respiratory effort/excursion; Chest: Nontender, Movement normal; Abdomen: Soft, Nontender, Nondistended, Normal bowel sounds; Genitourinary: No CVA tenderness; Extremities: Pulses normal, No tenderness, No edema, No calf edema or asymmetry.; Neuro: AA&Ox3, Major CN grossly intact.  Speech clear. Climbs on and off stretcher easily by herself. Gait steady. No gross focal motor or sensory deficits in extremities.; Skin: Color normal, Warm, Dry.   ED Course  Procedures     MDM  MDM Reviewed: previous chart, nursing note and vitals Reviewed previous: labs and ECG Interpretation: labs and ECG    Date: 05/21/2013  Rate: 75  Rhythm: normal sinus rhythm  QRS Axis: left  Intervals: normal  ST/T Wave abnormalities: nonspecific T wave changes, TWI leads V3, III, aVF; Q-waves leads I and aVL  Conduction Disutrbances:none  Narrative Interpretation:   Old EKG Reviewed: changes noted; TWI lead V3 and Q-waves lateral leads new since previous EKG dated 05/10/2013.  Results for orders placed during the hospital encounter of 05/21/13  GLUCOSE, CAPILLARY      Result Value Range   Glucose-Capillary >600 (*) 70 - 99 mg/dL  URINALYSIS, ROUTINE W REFLEX MICROSCOPIC      Result Value Range   Color, Urine YELLOW  YELLOW   APPearance CLEAR  CLEAR   Specific Gravity, Urine 1.010  1.005 - 1.030   pH 6.0  5.0 - 8.0   Glucose, UA >1000 (*) NEGATIVE mg/dL   Hgb urine dipstick TRACE (*) NEGATIVE   Bilirubin  Urine NEGATIVE  NEGATIVE   Ketones, ur NEGATIVE  NEGATIVE mg/dL   Protein, ur NEGATIVE  NEGATIVE mg/dL   Urobilinogen, UA 0.2  0.0 - 1.0 mg/dL   Nitrite NEGATIVE  NEGATIVE   Leukocytes, UA NEGATIVE  NEGATIVE  BASIC METABOLIC PANEL      Result Value Range   Sodium 131 (*) 135 - 145 mEq/L   Potassium 4.0  3.5 - 5.1 mEq/L   Chloride 92 (*) 96 - 112 mEq/L   CO2 28  19 - 32 mEq/L   Glucose, Bld 715 (*) 70 - 99 mg/dL   BUN 23  6 - 23 mg/dL   Creatinine, Ser 1.61  0.50 - 1.10 mg/dL   Calcium 9.6  8.4 - 09.6 mg/dL  GFR calc non Af Amer 59 (*) >90 mL/min   GFR calc Af Amer 68 (*) >90 mL/min  CBC WITH DIFFERENTIAL      Result Value Range   WBC 15.0 (*) 4.0 - 10.5 K/uL   RBC 3.73 (*) 3.87 - 5.11 MIL/uL   Hemoglobin 11.0 (*) 12.0 - 15.0 g/dL   HCT 16.1 (*) 09.6 - 04.5 %   MCV 89.0  78.0 - 100.0 fL   MCH 29.5  26.0 - 34.0 pg   MCHC 33.1  30.0 - 36.0 g/dL   RDW 40.9 (*) 81.1 - 91.4 %   Platelets 324  150 - 400 K/uL   Neutrophils Relative % 75  43 - 77 %   Neutro Abs 11.1 (*) 1.7 - 7.7 K/uL   Lymphocytes Relative 12  12 - 46 %   Lymphs Abs 1.8  0.7 - 4.0 K/uL   Monocytes Relative 9  3 - 12 %   Monocytes Absolute 1.4 (*) 0.1 - 1.0 K/uL   Eosinophils Relative 3  0 - 5 %   Eosinophils Absolute 0.5  0.0 - 0.7 K/uL   Basophils Relative 1  0 - 1 %   Basophils Absolute 0.2 (*) 0.0 - 0.1 K/uL   WBC Morphology ATYPICAL LYMPHOCYTES     RBC Morphology TARGET CELLS    TROPONIN I      Result Value Range   Troponin I <0.30  <0.30 ng/mL  BLOOD GAS, ARTERIAL      Result Value Range   FIO2 21.00     Delivery systems ROOM AIR     pH, Arterial 7.447  7.350 - 7.450   pCO2 arterial 39.1  35.0 - 45.0 mmHg   pO2, Arterial 84.6  80.0 - 100.0 mmHg   Bicarbonate 26.6 (*) 20.0 - 24.0 mEq/L   TCO2 24.2  0 - 100 mmol/L   Acid-Base Excess 2.8 (*) 0.0 - 2.0 mmol/L   O2 Saturation 96.2     Patient temperature 37.0     Collection site LEFT RADIAL     Drawn by [id number]     Sample type ARTERIAL     Allens  test (pass/fail) PENDING  PASS    2100:  Pt hyperglycemic but not acidotic with AG 11. Will start IV insulin gtt. Dx and testing d/w pt.  Questions answered.  Verb understanding, agreeable to observation admit. T/C to [facility] Dr. Orvan Falconer, case discussed, including:  HPI, pertinent PM/SHx, VS/PE, dx testing, ED course and treatment:  Agreeable to observation admit, requests to write temporary orders, obtain stepdown bed to Dr. Michelle Nasuti service.              Laray Anger, DO 05/22/13 1047

## 2013-05-21 NOTE — H&P (Signed)
[facility] History and Physical  ICIE KUZNICKI  ZOX:[id number]  DOB: [birth year]   DOA: 05/21/2013   PCP:   [name], MD   Chief Complaint:  Elevated blood sugar  HPI: [name] is a 67 y.o. female.  African American lady with diabetes type 2, maintained on metformin recently diagnosed with a metastatic pancreatic, being prepared for palliative chemotherapy. She made a routine visit total her oncologist this afternoon, at which time blood work was drawn. Half an hour after arriving back home she received a call advising her to go to the emergency room because of a blood sugar greater than 700.   Patient does report having polyuria, increased thirst and polydipsia for the past one week but did not recognize it as a symptom of uncontrolled blood sugar. She denies any other symptoms   Rewiew of Systems:   All systems negative except as marked bold or noted in the HPI;  Constitutional:    malaise, fever and chills. ;  Eyes:   eye pain, redness and discharge. ;  ENMT:   ear pain, hoarseness, nasal congestion, sinus pressure and sore throat. ;  Cardiovascular:    chest pain, palpitations, diaphoresis, dyspnea and peripheral edema.  Respiratory:   cough, hemoptysis, wheezing and stridor. ;  Gastrointestinal:  nausea, vomiting, diarrhea, constipation, abdominal pain, melena, blood in stool, hematemesis, jaundice and rectal bleeding. unusual weight loss..   Genitourinary:    frequency, dysuria, incontinence,flank pain and hematuria; Musculoskeletal:   back pain and neck pain.  swelling and trauma.;  Skin: .  pruritus, rash, abrasions, bruising and skin lesion.; ulcerations Neuro:    headache, lightheadedness and neck stiffness.  weakness, altered level of consciousness, altered mental status, extremity weakness, burning feet, involuntary movement, seizure and syncope.  Psych:    anxiety, depression, insomnia, tearfulness, panic attacks, hallucinations, paranoia, suicidal or  homicidal ideation    Past Medical History  Diagnosis Date  . Diabetes mellitus without complication   . Hypertension   . Anxiety   . Arthritis   . Pancreatic cancer     Past Surgical History  Procedure Laterality Date  . Abdominal hysterectomy    . Colonoscopy  2006    Dr. Jena Gauss, polyp, benign  . Ercp  05/10/2013    Procedure: ENDOSCOPIC RETROGRADE CHOLANGIOPANCREATOGRAPHY (ERCP)(DIFFICULT CANNULATION) ;  Surgeon: Malissa Hippo, MD;  Location: [facility];  Service: Endoscopy;;  . Biliary stent placement  05/10/2013    Procedure: BILIARY STENT PLACEMENT ( X WALLFLEX STENT);  Surgeon: Malissa Hippo, MD;  Location: [facility];  Service: Endoscopy;;  . Sphincterotomy  05/10/2013    Procedure: SPHINCTEROTOMY (PRECUT MADE WITH NEEDLE KNIFE);  Surgeon: Malissa Hippo, MD;  Location: [facility];  Service: Endoscopy;;  . Liver biopsy  05/12/2013    Medications:  HOME MEDS: Prior to Admission medications   Medication Sig Start Date End Date Taking? Authorizing Provider  ALPRAZolam Prudy Feeler) 1 MG tablet Take 1 mg by mouth 3 (three) times daily as needed for sleep or anxiety.   Yes Historical Provider, MD  amLODipine (NORVASC) 5 MG tablet Take 5 mg by mouth daily.   Yes Historical Provider, MD  losartan (COZAAR) 100 MG tablet Take 100 mg by mouth daily.   Yes Historical Provider, MD  metFORMIN (GLUCOPHAGE) 500 MG tablet Take 1 tablet (500 mg total) by mouth daily with breakfast. 05/04/13  Yes Joya Gaskins, MD     Allergies:  No Known Allergies  Social History:  reports that she has quit smoking. She quit smokeless tobacco use about 7 weeks ago. She reports that she does not drink alcohol or use illicit drugs.  Family History: Family History  Problem Relation Age of Onset  . Colon cancer Neg Hx   . Liver disease Neg Hx   . Bone cancer Brother   . Cancer Brother   . Hypertension Mother      Physical Exam: Filed Vitals:   05/21/13 1843 05/21/13 1942  BP: 151/83 173/79   Pulse: 90 79  Temp: 97.6 F (36.4 C)   TempSrc: Oral   Resp: 16 20  Height: 5\' 6"  (1.676 m)   Weight: 67.586 kg (149 lb)   SpO2: 99% 96%   Blood pressure 173/79, pulse 79, temperature 97.6 F (36.4 C), temperature source Oral, resp. rate 20, height 5\' 6"  (1.676 m), weight 67.586 kg (149 lb), SpO2 96.00%. Body mass index is 24.06 kg/(m^2).   GEN:  Pleasant, thin elderly African American lady lying bed in no acute distress; cooperative with exam PSYCH:  alert and oriented x4;  anxious nor depressed; affect is appropriate. HEENT: Mucous membranes pink; PERRLA; EOM intact; no cervical lymphadenopathy nor thyromegaly or carotid bruit; no JVD; Breasts:: Not examined CHEST WALL: No tenderness CHEST: Normal respiration, clear to auscultation bilaterally HEART: Regular rate and rhythm; no murmurs rubs or gallops BACK: No kyphosis no scoliosis; no CVA tenderness ABDOMEN: Obese, soft non-tender; irregular soft nodular enlargement of what is apparently the liver extending across the midline; normal abdominal bowel sounds; no pannus; no intertriginous candida. Rectal Exam: Not done EXTREMITIES:  age-appropriate arthropathy of the hands and knees; no edema; no ulcerations. Genitalia: not examined PULSES: 2+ and symmetric SKIN: Normal hydration no rash or ulceration CNS: Cranial nerves 2-12 grossly intact no focal lateralizing neurologic deficit   Labs on Admission:  Basic Metabolic Panel:  Recent Labs Lab 05/21/13 1630 05/21/13 1920  NA 128* 131*  K 3.8 4.0  CL 89* 92*  CO2 26 28  GLUCOSE 765* 715*  BUN 23 23  CREATININE 0.93 0.99  CALCIUM 9.3 9.6   Liver Function Tests:  Recent Labs Lab 05/21/13 1630  AST 39*  ALT 61*  ALKPHOS 318*  BILITOT 3.1*  PROT 7.8  ALBUMIN 2.7*   No results found for this basename: LIPASE, AMYLASE,  in the last 168 hours No results found for this basename: AMMONIA,  in the last 168 hours CBC:  Recent Labs Lab 05/21/13 1630 05/21/13 1920   WBC 14.3* 15.0*  NEUTROABS 10.9* 11.1*  HGB 10.4* 11.0*  HCT 31.2* 33.2*  MCV 90.2 89.0  PLT 304 324   Cardiac Enzymes:  Recent Labs Lab 05/21/13 1920  TROPONINI <0.30   BNP: No components found with this basename: POCBNP,  D-dimer: No components found with this basename: D-DIMER,  CBG:  Recent Labs Lab 05/21/13 1846  GLUCAP >600*    Radiological Exams on Admission: No results found.    Assessment/Plan  Active Problems:   Obstructive jaundice   Dehydration   HTN (hypertension)   Malignant neoplasm of pancreas, part unspecified   Diabetes type 2, uncontrolled   PLAN: Admit this lady to the step down unit for hydration and blood sugar control with a glucose stabilizer. Continue chronic medications except metformin. Since she denies using any steroids, the sudden rise in her blood sugar may be due to impaired pancreatic function secondary to tumor, and she may need transition to insulin instead of oral antidiabetic medication   Other  plans as per orders.  Code Status: Full code Family Communication: Plans discuss with patient and granddaughter at bedside Disposition Plan: Per primary care physician   Vikrant Pryce Nocturnist [facility] Pager [phone]   05/21/2013, 9:02 PM

## 2013-05-21 NOTE — ED Notes (Signed)
Patient c/o hyperglycemis. Per patient admitted here on the 11th for it. Patient states "It keeps going up and down." Patient called by Dr Karilyn Cota after appointment and told to come here because her sugar 765. Patient reports feeling drowsy.

## 2013-05-21 NOTE — Patient Instructions (Addendum)
[facility] Discharge Instructions  RECOMMENDATIONS MADE BY THE CONSULTANT AND ANY TEST RESULTS WILL BE SENT TO YOUR REFERRING PHYSICIAN.  EXAM FINDINGS BY THE PHYSICIAN TODAY AND SIGNS OR SYMPTOMS TO REPORT TO CLINIC OR PRIMARY PHYSICIAN:  Chemo teaching this week hopefully. I will be in touch with you regarding your port-a-cath placement and teaching.   We are drawing labs today for a baseline.  You will see Dr. Cherylann Ratel in the am @ 9:15.    Thank you for choosing [facility] to provide your oncology and hematology care.  To afford each patient quality time with our providers, please arrive at least 15 minutes before your scheduled appointment time.  With your help, our goal is to use those 15 minutes to complete the necessary work-up to ensure our physicians have the information they need to help with your evaluation and healthcare recommendations.    Effective January 1st, 2014, we ask that you re-schedule your appointment with our physicians should you arrive 10 or more minutes late for your appointment.  We strive to give you quality time with our providers, and arriving late affects you and other patients whose appointments are after yours.    Again, thank you for choosing [facility].  Our hope is that these requests will decrease the amount of time that you wait before being seen by our physicians.       _____________________________________________________________  Should you have questions after your visit to [facility], please contact our office at [phone] between the hours of 8:30 a.m. and 5:00 p.m.  Voicemails left after 4:30 p.m. will not be returned until the following business day.  For prescription refill requests, have your pharmacy contact our office with your prescription refill request.

## 2013-05-21 NOTE — Progress Notes (Signed)
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  Very pleasant lady who now has well-established biopsy-proven adenocarcinoma of the pancreas metastatic to liver. CAT scan also shows a single metastasis to the right lower lung. She is status post stent placement and I believe should have reevaluation because of recurrence of jaundice.  She states her bowels are back to normal color as is her urine. She has no pain she states her back or abdomen or flanks. She has lost approximately 15-20 pounds. She's not having fevers or chills presently. She does have some night sweats related to this disorder.  Her mother is a patient of mine and her brother was a patient of mine. He died in 05-May-2009. She has a daughter who died of a blood clot within the last 2 years after a dental procedure on her tonsils. There is no other family history of blood clots. She of course is at high risk for blood clots we went over that today.  BP 147/94  Pulse 96  Temp(Src) 99.2 F (37.3 C) (Oral)  Resp 20  Ht 5\' 3"  (1.6 m)  Wt 149 lb 8 oz (67.813 kg)  BMI 26.49 kg/m2  She is in no acute distress. She does have scleral icterus bilaterally. Lungs though are clear with diminished breath sounds over. She has no adenopathy in the cervical, supraclavicular, infraclavicular, axillary or inguinal areas. I cannot feel distinct mass but there is fullness in the epigastric area. I cannot feel hepatosplenomegaly. Bowel sounds are present and normal. Heart shows a regular rhythm rate right around 96 and regular. There is no is gallop or murmur. She has no leg edema or arm edema. She is alert and oriented. Facial symmetry is intact.  She is well aware of her diagnosis. She would like to pursue chemotherapy which is palliative only. She is  ready to proceed after port is placed. We have made arrangements for her to see Dr. Leticia Penna in Dr. Sueanne Margarita absence  at 9:15 AM we will try to start therapy Monday or Tuesday of next week at the latest.

## 2013-05-21 NOTE — ED Notes (Signed)
CRITICAL VALUE ALERT  Critical value received:  Glucose - 715  Date of notification:  05/21/2013  Time of notification:  2024  Critical value read back: yes  Nurse who received alert:  LJS  MD notified (1st page):  McMannus  Time of first page:  2025  MD notified (2nd page):  Time of second page:  Responding MD:  McMannus  Time MD responded:  2025

## 2013-05-22 DIAGNOSIS — E1165 Type 2 diabetes mellitus with hyperglycemia: Secondary | ICD-10-CM | POA: Diagnosis present

## 2013-05-22 LAB — GLUCOSE, CAPILLARY
Glucose-Capillary: 146 mg/dL — ABNORMAL HIGH (ref 70–99)
Glucose-Capillary: 122 mg/dL — ABNORMAL HIGH (ref 70–99)
Glucose-Capillary: 164 mg/dL — ABNORMAL HIGH (ref 70–99)
Glucose-Capillary: 232 mg/dL — ABNORMAL HIGH (ref 70–99)

## 2013-05-22 LAB — BASIC METABOLIC PANEL
CO2: 31 mEq/L (ref 19–32)
Calcium: 8.9 mg/dL (ref 8.4–10.5)
Chloride: 106 mEq/L (ref 96–112)
Glucose, Bld: 101 mg/dL — ABNORMAL HIGH (ref 70–99)
Potassium: 3.3 mEq/L — ABNORMAL LOW (ref 3.5–5.1)
Sodium: 142 mEq/L (ref 135–145)

## 2013-05-22 LAB — CBC
HCT: 31.5 % — ABNORMAL LOW (ref 36.0–46.0)
Hemoglobin: 10.5 g/dL — ABNORMAL LOW (ref 12.0–15.0)
MCH: 29.2 pg (ref 26.0–34.0)
MCV: 87.7 fL (ref 78.0–100.0)
RBC: 3.59 MIL/uL — ABNORMAL LOW (ref 3.87–5.11)

## 2013-05-22 MED ORDER — INSULIN ASPART 100 UNIT/ML ~~LOC~~ SOLN
0.0000 [IU] | Freq: Three times a day (TID) | SUBCUTANEOUS | Status: DC
  Administered 2013-05-22: 3 [IU] via SUBCUTANEOUS
  Administered 2013-05-22: 8 [IU] via SUBCUTANEOUS
  Administered 2013-05-22: 5 [IU] via SUBCUTANEOUS
  Administered 2013-05-23: 8 [IU] via SUBCUTANEOUS

## 2013-05-22 MED ORDER — LIDOCAINE-PRILOCAINE 2.5-2.5 % EX CREA: TOPICAL_CREAM | CUTANEOUS | Status: DC | PRN

## 2013-05-22 MED ORDER — TRAZODONE HCL 50 MG PO TABS: 50.0000 mg | ORAL_TABLET | Freq: Every evening | ORAL | Status: DC | PRN

## 2013-05-22 MED ORDER — HEPARIN SODIUM (PORCINE) 5000 UNIT/ML IJ SOLN
5000.0000 [IU] | Freq: Three times a day (TID) | INTRAMUSCULAR | Status: DC
  Administered 2013-05-22 (×3): 5000 [IU] via SUBCUTANEOUS
  Filled 2013-05-22 (×4): qty 1

## 2013-05-22 MED ORDER — SODIUM CHLORIDE 0.9 % IV SOLN
INTRAVENOUS | Status: DC
  Administered 2013-05-22: 7.5 [IU]/h via INTRAVENOUS
  Filled 2013-05-22: qty 1

## 2013-05-22 MED ORDER — LOPERAMIDE HCL 2 MG PO TABS: ORAL_TABLET | ORAL | Status: DC

## 2013-05-22 MED ORDER — INSULIN PEN STARTER KIT
1.0000 | Freq: Once | Status: AC
  Administered 2013-05-22: 1
  Filled 2013-05-22: qty 1

## 2013-05-22 MED ORDER — INSULIN GLARGINE 100 UNIT/ML ~~LOC~~ SOLN
10.0000 [IU] | Freq: Every day | SUBCUTANEOUS | Status: DC
  Administered 2013-05-22: 10 [IU] via SUBCUTANEOUS
  Filled 2013-05-22 (×5): qty 0.1

## 2013-05-22 MED ORDER — INSULIN REGULAR BOLUS VIA INFUSION
0.0000 [IU] | Freq: Three times a day (TID) | INTRAVENOUS | Status: DC
  Filled 2013-05-22: qty 10

## 2013-05-22 MED ORDER — DEXAMETHASONE 4 MG PO TABS: ORAL_TABLET | ORAL | Status: DC

## 2013-05-22 MED ORDER — INSULIN ASPART 100 UNIT/ML ~~LOC~~ SOLN
0.0000 [IU] | Freq: Every day | SUBCUTANEOUS | Status: DC
  Administered 2013-05-22: 2 [IU] via SUBCUTANEOUS

## 2013-05-22 MED ORDER — DEXTROSE 50 % IV SOLN: 25.0000 mL | INTRAVENOUS | Status: DC | PRN

## 2013-05-22 MED ORDER — ALPRAZOLAM 1 MG PO TABS: 1.0000 mg | ORAL_TABLET | Freq: Three times a day (TID) | ORAL | Status: DC | PRN

## 2013-05-22 MED ORDER — OXYCODONE HCL 5 MG PO TABS: 5.0000 mg | ORAL_TABLET | ORAL | Status: DC | PRN

## 2013-05-22 MED ORDER — ONDANSETRON HCL 8 MG PO TABS: ORAL_TABLET | ORAL | Status: DC

## 2013-05-22 MED ORDER — FLEET ENEMA 7-19 GM/118ML RE ENEM: 1.0000 | ENEMA | Freq: Once | RECTAL | Status: AC | PRN

## 2013-05-22 MED ORDER — AMLODIPINE BESYLATE 5 MG PO TABS
5.0000 mg | ORAL_TABLET | Freq: Every day | ORAL | Status: DC
  Administered 2013-05-22: 5 mg via ORAL
  Filled 2013-05-22: qty 1

## 2013-05-22 MED ORDER — DEXTROSE-NACL 5-0.45 % IV SOLN
INTRAVENOUS | Status: DC
  Administered 2013-05-22 – 2013-05-23 (×3): via INTRAVENOUS

## 2013-05-22 MED ORDER — HYDROMORPHONE HCL PF 1 MG/ML IJ SOLN: 0.5000 mg | INTRAMUSCULAR | Status: DC | PRN

## 2013-05-22 MED ORDER — POLYETHYLENE GLYCOL 3350 17 G PO PACK: 17.0000 g | PACK | Freq: Every day | ORAL | Status: DC | PRN

## 2013-05-22 MED ORDER — BISACODYL 10 MG RE SUPP: 10.0000 mg | Freq: Every day | RECTAL | Status: DC | PRN

## 2013-05-22 MED ORDER — SODIUM CHLORIDE 0.9 % IV SOLN
INTRAVENOUS | Status: DC
  Administered 2013-05-22: 01:00:00 via INTRAVENOUS

## 2013-05-22 NOTE — Progress Notes (Signed)
NAMECARTHA, ROTERT                ACCOUNT NO.:  [id number]  MEDICAL RECORD NO.:  [id number]  LOCATION:  IC06                          FACILITY:  [facility]  PHYSICIAN:  Mila Homer. [name], M.D.DATE OF BIRTH:  07-21-1946  DATE OF PROCEDURE: DATE OF DISCHARGE:                                PROGRESS NOTE   SUBJECTIVE:  This 67 year old was seen in my office yesterday and felt fine.  She was eating well and was energetic.  She was then seen at the office of Dr. Mariel Sleet, oncologist.  A routine screening panel was drawn along with test to evaluate her liver function.  Unexpectedly her glucose was greater than 700.  She was admitted to the hospital to the ICU and put on a Glucommander protocol and glucose has gradually dropped down to the 100 range.  She continues to feel normal.  OBJECTIVE:  VITAL SIGNS:  Temperature 98.9, pulse 66, respiratory 11, blood pressure 174/68. GENERAL:  She is sitting up in bed.  She is oriented, alert.  She is energetic.  She is in no acute distress at present.  Her son is in the room. HEART:  Her heart has a regular rhythm and a rate of 70. LUNGS:  Her lungs are clear throughout.  She is moving air well. ABDOMEN:  Soft. There is no smell of ketosis.  ASSESSMENT: 1. Metastatic adenocarcinoma of the pancreas.  This has metastasized     to the lung and liver. 2. Insulin dependent diabetes.  PLAN:  We discussed the difference between the diabetes she has had in the past, which responded to weight loss and diet, as well as p.o. meds and the diabetes she probably has now, which is probably secondary to insulin deprivation.  A C-peptide is pending and she has continued to be on sliding scale insulin but I will start her on Lantus as well today.  She will be staying in the hospital at least until tomorrow because she needs a port placed prior to chemotherapy, which is due to start in 4 days.  She has a sister who has had diabetes and is on insulin which  should help when she goes home.  She will need diabetic teaching.     Mila Homer. [name], M.D.     SDK/MEDQ  D:  05/22/2013  T:  05/22/2013  Job:  [id number]

## 2013-05-22 NOTE — Progress Notes (Signed)
Report given to Anderson Malta. Patient transferred via wheelchair by RN and NT. Patient stable at time of transfer. Telemetry placed and CMT notified.  Patient watched video 511 on insulin pen use and given Diabetes education booklet and Exit care information.  Patient has previously controlled DM with oral agents and diet, patient is knowledgeable regarding disease process.

## 2013-05-22 NOTE — Patient Instructions (Addendum)
[facility]   CHEMOTHERAPY INSTRUCTIONS  Oxaliplatin - anaphylactic reaction, neurotoxicity (i.e., headache, fatigue, difficulty sleeping, pain). Peripheral neuropathy (numbness/tingling/burning in hands/fingers/feet/toes) - will be aggravated by cold/cool temperatures. We need to know when you develop peripheral neuropathy so that we can monitor it and treat if necessary. Nausea/vomiting, diarrhea, bone marrow suppression (lowers white blood cells (fight infection), lowers red blood cells (make up your blood), lowers platelets (help blood to clot). Pulmonary fibrosis. Once you have received Oxaliplatin do NOT eat or drinking anything cold/cool for 5-10 days! Do NOT breathe in cold/cool air and do NOT touch anything cold for 5-10 days. The time frame varies from patient to patient on the length of time you must abstain from the above mentioned. Best advice is to wait at least 5 days before attempting to reintroduce cold/cool back into life. Slowly reintroduce cool/cold things! Wear gloves when getting items out of the refrigerator (of course, these would be things you are going to heat to eat)!  Irinotecan is a chemotherapy drug primarily used to treat colon and rectal cancer. This drug like other chemo can cause lowering of blood counts, increased risk of infection, bleeding or bruising more easily, fatigue, hair loss, mouth sores, stomach cramps, diarrhea, and nausea with vomiting. The main symptom that we look for with this drug is stomach cramps and diarrhea. If you develop diarrhea we want you to take 2 imodium after the first loose stool then take 1 imodium every 2 hrs until you go 12 hrs without diarrhea. It is very important that you drink 6-8 glasses of fluids daily while going thru chemo especially if you are having diarrhea. We do not want you to get dehydrated. If the imodium does not stop diarrhea within 12 hrs please call us at [phone]. If this occurs on the  weekend report to the ED.  Leucovorin - this is a medication that is not chemo but given with chemo. This med "rescues" the healthy cells before we administer the drug 5FU. This makes the 5FU work better.   5FU: bone marrow suppression (low white blood cells - wbcs fight infection, low red blood cells - rbcs make up your blood, low platelets - this is what makes your blood clot, nausea/vomiting, diarrhea, mouth sores, hair loss, dry skin, ocular toxicities (increased tear production, sensitivity to light). You must wear sunscreen/sunglasses. Cover your skin when out in sunlight. You will get burned very easily.     POTENTIAL SIDE EFFECTS OF TREATMENT: Increased Susceptibility to Infection   SELF IMAGE NEEDS AND REFERRALS MADE: Obtain hair accessories as soon as possible (wigs, scarves, turbans,caps,etc.)  Referral to Look Good, Feel Better consultant - call the 800# on the brochure and register yourself for our event. We do it here at [facility].    EDUCATIONAL MATERIALS GIVEN AND REVIEWED: Chemotherapy and You  Specific Instructions Sheets: Oxaliplatin, Irinotecan, 5FU, Leucovorin   SELF CARE ACTIVITIES WHILE ON CHEMOTHERAPY: Increase your fluid intake 48 hours prior to treatment and drink at least 2 quarts (64 oz of decaffeinated fluids) per day after treatment., No alcohol intake., No aspirin or other medications unless approved by your oncologist., Eat foods that are light and easy to digest., No fried, fatty, or spicy foods immediately before or after treatment., Have teeth cleaned professionally before starting treatment. Keep dentures and partial plates clean., Use soft toothbrush and do not use mouthwashes that contain alcohol. Biotene is a good mouthwash that is available at most pharmacies or  may be ordered by calling [phone]) [phone]., Use warm salt water gargles (1 teaspoon salt per 1 quart warm water) before and after meals and at bedtime. Or you may rinse with 2 tablespoons of  three -percent hydrogen peroxide mixed in eight ounces of water., Always use sunscreen with SPF (Sun Protection Factor) of 30 or higher., Use your nausea medication as directed to prevent nausea. and Use your anti-diarrheal medication as directed to stop diarrhea.  Please wash your hands for at least 30 seconds using warm soapy water. Handwashing is the #1 way to prevent the spread of germs. Stay away from sick people or people who are getting over a cold. If you develop respiratory systems such as green/yellow mucus production or productive cough or persistent cough let us know and we will see if you need an antibiotic. It is a good idea to keep a pair of gloves on when going into grocery stores/[employer] to decrease your risk of coming into contact with germs on the carts, etc. Carry alcohol hand gel with you at all times and use it frequently if out in public. All foods need to be cooked thoroughly. No raw foods. No medium or undercooked meats, eggs. If your food is cooked medium well, it does not need to be hot pink or saturated with bloody liquid at all. Vegetables and fruits need to be washed/rinsed under the faucet with a dish detergent before being consumed. You can eat raw fruits and vegetables unless we tell you otherwise but it would be best if you cooked them or bought frozen. Do not eat off of salad bars or hot bars unless you really trust the cleanliness of the restaurant. If you need dental work, please let Dr. Mariel Sleet know before you go for your appointment so that we can coordinate the best possible time for you in regards to your chemo regimen. You need to also let your dentist know that you are actively taking chemo. We may need to do labs prior to your dental appointment. We also want your bowels moving at least every other day. If this is not happening, we need to know so that we can get you on a bowel regimen to help you go.    MEDICATIONS: You have been given prescriptions for the  following medications:  Dexamethsone 4mg  tablet. Starting the day after chemo, take 2 tablets in the am and 2 tablets in the pm for 3 days. Take with food.   Zofran/ondansetron 8mg  tablet. Starting the day after chemo, take 1 tab in the am and 1 tab in the pm for 3 days. Then may take 1 tablet two times a day IF needed for nausea/vomiting.   Ativan/lorazepam 0.5mg  tablet. May take 1-2 tablets every 6 hours IF needed for nausea/vomiting. Do not take this within 2 hours of taking a Xanax. This tablet will make you sleepy/drowsy.  EMLA cream. Apply a quarter size amount of cream to port site 1 hour prior to chemo. Do not rub in. Cover with plastic.   Imodium - this is for diarrhea. Take 2 tabs after 1st loose stool and then 1 tab every 2 hours until you go 12 hours without having a BM. At bedtime, may take 2 tabs every 4 hours. If loose stools continue past 12 hours, call Cancer Center.     SYMPTOMS TO REPORT AS SOON AS POSSIBLE AFTER TREATMENT:  FEVER GREATER THAN 100.5 F  CHILLS WITH OR WITHOUT FEVER  NAUSEA AND VOMITING THAT IS NOT  CONTROLLED WITH YOUR NAUSEA MEDICATION  UNUSUAL SHORTNESS OF BREATH  UNUSUAL BRUISING OR BLEEDING  TENDERNESS IN MOUTH AND THROAT WITH OR WITHOUT PRESENCE OF ULCERS  URINARY PROBLEMS  BOWEL PROBLEMS  UNUSUAL RASH    Wear comfortable clothing and clothing appropriate for easy access to any Portacath or PICC line. Let us know if there is anything that we can do to make your therapy better!      I have been informed and understand all of the instructions given to me and have received a copy. I have been instructed to call the clinic [phone] or my family physician as soon as possible for continued medical care, if indicated. I do not have any more questions at this time but understand that I may call the Cancer Center or the Patient Navigator at [phone] during office hours should I have questions or need assistance in obtaining follow-up  care.      _________________________________________      _______________     __________ Signature of Patient or Authorized Representative        Date                            Time      _________________________________________ Nurse's Signature

## 2013-05-22 NOTE — Plan of Care (Signed)
Problem: Problem: Diabetes Management Progression Goal: HYPERGYCEMIA - GLUCOSE GREATER THAN 180 MG/DL Outcome: Progressing Pt was admitted to ED with CBG=765 Pt is now in ICU on glucoses stabilizer with last cbg of 311 Goal: INCREASE DIABETES KNOWLEDGE Outcome: Progressing Pt given diabetes teaching on diet planning & standard of medical care & FAQ's

## 2013-05-22 NOTE — Progress Notes (Signed)
UR chart review completed.  

## 2013-05-22 NOTE — Care Management Note (Unsigned)
    Page 1 of 1   05/22/2013     2:53:40 PM   CARE MANAGEMENT NOTE 05/22/2013  Patient:  [name], [name]   Account Number:  [id number]  Date Initiated:  05/22/2013  Documentation initiated by:  Sharrie Rothman  Subjective/Objective Assessment:   Pt admitted from home with hyperglycemia. Pt lives with her son and daughter and will return home at discharge. Pt is independent with ADL's.     Action/Plan:   Pt will need glucometer prescription at discharge. Pt refuses any need for HH. Staff is educating and demonstrating insulin administration and pt stated that her sister is diabetic and can help her if needed with insulin administration.   Anticipated DC Date:  05/23/2013   Anticipated DC Plan:  HOME/SELF CARE      DC Planning Services  CM consult      Choice offered to / List presented to:             Status of service:  Completed, signed off Medicare Important Message given?   (If response is "NO", the following Medicare IM given date fields will be blank) Date Medicare IM given:   Date Additional Medicare IM given:    Discharge Disposition:    Per UR Regulation:    If discussed at Long Length of Stay Meetings, dates discussed:    Comments:  05/22/13 1450 Arlyss Queen, RN BSN CM

## 2013-05-23 ENCOUNTER — Encounter (HOSPITAL_COMMUNITY): Payer: Self-pay | Admitting: Anesthesiology

## 2013-05-23 ENCOUNTER — Inpatient Hospital Stay (HOSPITAL_COMMUNITY): Payer: Medicare Other

## 2013-05-23 ENCOUNTER — Encounter (HOSPITAL_COMMUNITY): Payer: Self-pay | Admitting: *Deleted

## 2013-05-23 ENCOUNTER — Inpatient Hospital Stay (HOSPITAL_COMMUNITY): Payer: Medicare Other | Admitting: Anesthesiology

## 2013-05-23 ENCOUNTER — Encounter (HOSPITAL_COMMUNITY)
Admission: EM | Disposition: A | Discharge status: Home / Self Care | Payer: Self-pay | Source: Home / Self Care | Attending: Family Medicine

## 2013-05-23 DIAGNOSIS — C259 Malignant neoplasm of pancreas, unspecified: Secondary | ICD-10-CM

## 2013-05-23 HISTORY — PX: PORTACATH PLACEMENT: SHX2246

## 2013-05-23 LAB — BASIC METABOLIC PANEL
BUN: 14 mg/dL (ref 6–23)
Calcium: 8.4 mg/dL (ref 8.4–10.5)
Chloride: 101 mEq/L (ref 96–112)
Creatinine, Ser: 0.8 mg/dL (ref 0.50–1.10)
GFR calc Af Amer: 88 mL/min — ABNORMAL LOW (ref >90)
GFR calc non Af Amer: 76 mL/min — ABNORMAL LOW (ref >90)

## 2013-05-23 LAB — CBC WITH DIFFERENTIAL/PLATELET
Basophils Absolute: 0.1 10*3/uL (ref 0.0–0.1)
Basophils Relative: 0 % (ref 0–1)
Eosinophils Absolute: 0.5 10*3/uL (ref 0.0–0.7)
Eosinophils Relative: 4 % (ref 0–5)
HCT: 30.8 % — ABNORMAL LOW (ref 36.0–46.0)
Hemoglobin: 10.5 g/dL — ABNORMAL LOW (ref 12.0–15.0)
MCH: 29.8 pg (ref 26.0–34.0)
MCHC: 34.1 g/dL (ref 30.0–36.0)
MCV: 87.5 fL (ref 78.0–100.0)
Monocytes Absolute: 1.1 10*3/uL — ABNORMAL HIGH (ref 0.1–1.0)
Monocytes Relative: 9 % (ref 3–12)
RDW: 16.5 % — ABNORMAL HIGH (ref 11.5–15.5)

## 2013-05-23 LAB — URINE CULTURE: Colony Count: 80000

## 2013-05-23 LAB — CANCER ANTIGEN 19-9: CA 19-9: >140000 U/mL — ABNORMAL HIGH (ref <35.0)

## 2013-05-23 LAB — SURGICAL PCR SCREEN: Staphylococcus aureus: NEGATIVE

## 2013-05-23 LAB — PROTIME-INR: Prothrombin Time: 14.5 seconds (ref 11.6–15.2)

## 2013-05-23 LAB — C-PEPTIDE: C-Peptide: 1.45 ng/mL (ref 0.80–3.90)

## 2013-05-23 LAB — GLUCOSE, CAPILLARY: Glucose-Capillary: 227 mg/dL — ABNORMAL HIGH (ref 70–99)

## 2013-05-23 SURGERY — INSERTION, TUNNELED CENTRAL VENOUS DEVICE, WITH PORT
Anesthesia: Monitor Anesthesia Care | Site: Chest | Laterality: Left | Wound class: Clean

## 2013-05-23 MED ORDER — ARTIFICIAL TEARS OP OINT
TOPICAL_OINTMENT | OPHTHALMIC | Status: AC
  Filled 2013-05-23: qty 3.5

## 2013-05-23 MED ORDER — LACTATED RINGERS IV SOLN
INTRAVENOUS | Status: DC
  Administered 2013-05-23: 10:00:00 via INTRAVENOUS

## 2013-05-23 MED ORDER — SODIUM CHLORIDE 0.9 % IV SOLN
INTRAVENOUS | Status: DC | PRN
  Administered 2013-05-23: 500 mL via INTRAMUSCULAR

## 2013-05-23 MED ORDER — CEFAZOLIN SODIUM-DEXTROSE 2-3 GM-% IV SOLR
2.0000 g | INTRAVENOUS | Status: AC
  Administered 2013-05-23: 2 g via INTRAVENOUS

## 2013-05-23 MED ORDER — PROPOFOL INFUSION 10 MG/ML OPTIME
INTRAVENOUS | Status: DC | PRN
  Administered 2013-05-23: 75 ug/kg/min via INTRAVENOUS

## 2013-05-23 MED ORDER — HEPARIN SODIUM (PORCINE) 1000 UNIT/ML IJ SOLN
INTRAMUSCULAR | Status: DC | PRN
  Administered 2013-05-23: 3000 [IU] via INTRAVENOUS

## 2013-05-23 MED ORDER — CELECOXIB 100 MG PO CAPS: 400.0000 mg | ORAL_CAPSULE | Freq: Every day | ORAL | Status: DC

## 2013-05-23 MED ORDER — FENTANYL CITRATE 0.05 MG/ML IJ SOLN
INTRAMUSCULAR | Status: AC
  Filled 2013-05-23: qty 2

## 2013-05-23 MED ORDER — FENTANYL CITRATE 0.05 MG/ML IJ SOLN
INTRAMUSCULAR | Status: DC | PRN
  Administered 2013-05-23 (×4): 25 ug via INTRAVENOUS

## 2013-05-23 MED ORDER — HYDROCODONE-ACETAMINOPHEN 5-325 MG PO TABS: 1.0000 | ORAL_TABLET | ORAL | Status: DC | PRN

## 2013-05-23 MED ORDER — PROPOFOL 10 MG/ML IV EMUL
INTRAVENOUS | Status: AC
  Filled 2013-05-23: qty 20

## 2013-05-23 MED ORDER — CEFAZOLIN SODIUM-DEXTROSE 2-3 GM-% IV SOLR
INTRAVENOUS | Status: AC
  Filled 2013-05-23: qty 50

## 2013-05-23 MED ORDER — LIDOCAINE HCL (PF) 1 % IJ SOLN
INTRAMUSCULAR | Status: DC | PRN
  Administered 2013-05-23: 20 mL

## 2013-05-23 MED ORDER — HEPARIN SODIUM (PORCINE) 1000 UNIT/ML IJ SOLN
INTRAMUSCULAR | Status: AC
  Filled 2013-05-23: qty 3

## 2013-05-23 MED ORDER — LIDOCAINE HCL (CARDIAC) 10 MG/ML IV SOLN
INTRAVENOUS | Status: DC | PRN
  Administered 2013-05-23: 50 mg via INTRAVENOUS

## 2013-05-23 MED ORDER — INSULIN GLARGINE 100 UNIT/ML ~~LOC~~ SOLN: 20.0000 [IU] | Freq: Every day | SUBCUTANEOUS | Status: DC

## 2013-05-23 MED ORDER — FENTANYL CITRATE 0.05 MG/ML IJ SOLN: 25.0000 ug | INTRAMUSCULAR | Status: DC | PRN

## 2013-05-23 MED ORDER — MIDAZOLAM HCL 2 MG/2ML IJ SOLN
1.0000 mg | INTRAMUSCULAR | Status: DC | PRN
  Administered 2013-05-23: 2 mg via INTRAVENOUS

## 2013-05-23 MED ORDER — LIDOCAINE HCL (PF) 1 % IJ SOLN
INTRAMUSCULAR | Status: AC
  Filled 2013-05-23: qty 30

## 2013-05-23 MED ORDER — LACTATED RINGERS IV SOLN
INTRAVENOUS | Status: DC | PRN
  Administered 2013-05-23: 11:00:00 via INTRAVENOUS

## 2013-05-23 MED ORDER — ONDANSETRON HCL 4 MG/2ML IJ SOLN: 4.0000 mg | Freq: Once | INTRAMUSCULAR | Status: DC | PRN

## 2013-05-23 MED ORDER — LIDOCAINE HCL (PF) 1 % IJ SOLN
INTRAMUSCULAR | Status: AC
  Filled 2013-05-23: qty 5

## 2013-05-23 MED ORDER — MIDAZOLAM HCL 2 MG/2ML IJ SOLN
INTRAMUSCULAR | Status: AC
  Filled 2013-05-23: qty 2

## 2013-05-23 SURGICAL SUPPLY — 40 items
APPLIER CLIP 9.375 SM OPEN (CLIP)
BAG DECANTER FOR FLEXI CONT (MISCELLANEOUS) ×2 IMPLANT
BAG HAMPER (MISCELLANEOUS) ×2 IMPLANT
BENZOIN TINCTURE PRP APPL 2/3 (GAUZE/BANDAGES/DRESSINGS) ×2 IMPLANT
CATH HICKMAN DUAL 12.0 (CATHETERS) IMPLANT
CLIP APPLIE 9.375 SM OPEN (CLIP) IMPLANT
CLOTH BEACON ORANGE TIMEOUT ST (SAFETY) ×2 IMPLANT
COVER LIGHT HANDLE STERIS (MISCELLANEOUS) ×4 IMPLANT
DECANTER SPIKE VIAL GLASS SM (MISCELLANEOUS) ×2 IMPLANT
DRAPE C-ARM FOLDED MOBILE STRL (DRAPES) ×2 IMPLANT
DURAPREP 6ML APPLICATOR 50/CS (WOUND CARE) ×2 IMPLANT
ELECT REM PT RETURN 9FT ADLT (ELECTROSURGICAL) ×2
ELECTRODE REM PT RTRN 9FT ADLT (ELECTROSURGICAL) ×1 IMPLANT
GLOVE BIOGEL PI IND STRL 7.0 (GLOVE) ×1 IMPLANT
GLOVE BIOGEL PI IND STRL 7.5 (GLOVE) ×2 IMPLANT
GLOVE BIOGEL PI INDICATOR 7.0 (GLOVE) ×1
GLOVE BIOGEL PI INDICATOR 7.5 (GLOVE) ×2
GLOVE ECLIPSE 7.0 STRL STRAW (GLOVE) ×2 IMPLANT
GLOVE SS BIOGEL STRL SZ 6.5 (GLOVE) ×1 IMPLANT
GLOVE SUPERSENSE BIOGEL SZ 6.5 (GLOVE) ×1
GOWN STRL REIN XL XLG (GOWN DISPOSABLE) ×4 IMPLANT
IV NS 500ML (IV SOLUTION) ×1
IV NS 500ML BAXH (IV SOLUTION) ×1 IMPLANT
KIT PORT POWER 8FR ISP MRI (CATHETERS) ×2 IMPLANT
KIT ROOM TURNOVER APOR (KITS) ×2 IMPLANT
MANIFOLD NEPTUNE II (INSTRUMENTS) ×2 IMPLANT
NEEDLE HYPO 18GX1.5 BLUNT FILL (NEEDLE) ×2 IMPLANT
NEEDLE HYPO 25X1 1.5 SAFETY (NEEDLE) ×2 IMPLANT
NS IRRIG 1000ML POUR BTL (IV SOLUTION) ×2 IMPLANT
PACK MINOR (CUSTOM PROCEDURE TRAY) ×2 IMPLANT
PAD ARMBOARD 7.5X6 YLW CONV (MISCELLANEOUS) ×2 IMPLANT
SET BASIN LINEN APH (SET/KITS/TRAYS/PACK) ×2 IMPLANT
SET INTRODUCER 12FR PACEMAKER (SHEATH) IMPLANT
SHEATH COOK PEEL AWAY SET 8F (SHEATH) IMPLANT
STRIP CLOSURE SKIN 1/2X4 (GAUZE/BANDAGES/DRESSINGS) ×2 IMPLANT
SUT MNCRL AB 4-0 PS2 18 (SUTURE) ×2 IMPLANT
SUT VIC AB 3-0 SH 27 (SUTURE) ×1
SUT VIC AB 3-0 SH 27X BRD (SUTURE) ×1 IMPLANT
SYR CONTROL 10ML LL (SYRINGE) ×2 IMPLANT
SYRINGE 10CC LL (SYRINGE) ×2 IMPLANT

## 2013-05-23 NOTE — Transfer of Care (Signed)
Immediate Anesthesia Transfer of Care Note  Patient: [name]  Procedure(s) Performed: Procedure(s): INSERTION PORT-A-CATH (Left)  Patient Location: PACU  Anesthesia Type:MAC  Level of Consciousness: awake, alert , oriented and patient cooperative  Airway & Oxygen Therapy: Patient Spontanous Breathing and Patient connected to face mask oxygen  Post-op Assessment: Report given to PACU RN and Post -op Vital signs reviewed and stable  Post vital signs: Reviewed and stable  Complications: No apparent anesthesia complications

## 2013-05-23 NOTE — Anesthesia Procedure Notes (Signed)
Procedure Name: MAC Date/Time: 05/23/2013 11:23 AM Performed by: Carolyne Littles, AMY L Pre-anesthesia Checklist: Patient identified, Timeout performed, Emergency Drugs available, Suction available and Patient being monitored Patient Re-evaluated:Patient Re-evaluated prior to inductionOxygen Delivery Method: Non-rebreather mask

## 2013-05-23 NOTE — Care Management Note (Signed)
    Page 1 of 1   05/23/2013     11:17:39 AM   CARE MANAGEMENT NOTE 05/23/2013  Patient:  [name], [name]   Account Number:  [id number]  Date Initiated:  05/23/2013  Documentation initiated by:  Rosemary Holms  Subjective/Objective Assessment:   Pt admitted from home where she lives with her sister. Pt will DC as a new insulin dependent diabetic. Per CM Arlyss Queen, HH declined since her sister is also on insulin and can assist her. RN's continuing diabetic education inpt.     Action/Plan:   Pt needs home glucometer. Order sheet on shadow chart. Order will need to be taken by Dr. Michelle Nasuti to sign if DC'd today. Pt getting Parta Cath for Palitive chemo to begin on Monday.   Anticipated DC Date:  05/23/2013   Anticipated DC Plan:  HOME/SELF CARE      DC Planning Services  CM consult      Choice offered to / List presented to:             Status of service:  Completed, signed off Medicare Important Message given?  NA - LOS <3 / Initial given by admissions (If response is "NO", the following Medicare IM given date fields will be blank) Date Medicare IM given:   Date Additional Medicare IM given:    Discharge Disposition:  HOME/SELF CARE  Per UR Regulation:    If discussed at Long Length of Stay Meetings, dates discussed:    Comments:  05/23/13 Rosemary Holms RN BSN CM

## 2013-05-23 NOTE — Progress Notes (Signed)
Spoke with patient about using insulin at home.  Patient was able to watch the patient education video on insulin pen administration yesterday.  Patient reports that her sister is a diabetic and she uses an insulin pen.  Per the patient, her sister will be helping her out at home. Taught patient about Lantus and instructed on insulin injections via insulin pen.  After demonstration of proper utilization of an insulin pen patient is able to teach back and demonstrate proper utilization of insulin pen.   Thanks, Orlando Penner, RN, MSN, CCRN Diabetes Coordinator Inpatient Diabetes Program [phone]

## 2013-05-23 NOTE — Preoperative (Signed)
Beta Blockers   Reason not to administer Beta Blockers:Not Applicable 

## 2013-05-23 NOTE — Consult Note (Signed)
Reason for Consult: Pancreatic mass Referring Physician: Dr. [name] is an 67 y.o. female.  HPI: Patient presented to [facility] with obstructive jaundice and elevated blood sugars. She was admitted for continued management. She was actually scheduled to see our office as an outpatient for Port-A-Cath discussion. Plans are to proceed with chemotherapy in treatment of her obstructive pancreatic mass on Monday. She was advised to have a Port-A-Cath placed by oncology.  Past Medical History  Diagnosis Date  . Diabetes mellitus without complication   . Hypertension   . Anxiety   . Arthritis   . Pancreatic cancer     Past Surgical History  Procedure Laterality Date  . Abdominal hysterectomy    . Colonoscopy  2006    Dr. Jena Gauss, polyp, benign  . Ercp  05/10/2013    Procedure: ENDOSCOPIC RETROGRADE CHOLANGIOPANCREATOGRAPHY (ERCP)(DIFFICULT CANNULATION) ;  Surgeon: Malissa Hippo, MD;  Location: [facility];  Service: Endoscopy;;  . Biliary stent placement  05/10/2013    Procedure: BILIARY STENT PLACEMENT ( X WALLFLEX STENT);  Surgeon: Malissa Hippo, MD;  Location: [facility];  Service: Endoscopy;;  . Sphincterotomy  05/10/2013    Procedure: SPHINCTEROTOMY (PRECUT MADE WITH NEEDLE KNIFE);  Surgeon: Malissa Hippo, MD;  Location: [facility];  Service: Endoscopy;;  . Liver biopsy  05/12/2013    Family History  Problem Relation Age of Onset  . Colon cancer Neg Hx   . Liver disease Neg Hx   . Bone cancer Brother   . Cancer Brother   . Hypertension Mother     Social History:  reports that she has quit smoking. She quit smokeless tobacco use about 8 weeks ago. She reports that she does not drink alcohol or use illicit drugs.  Allergies: No Known Allergies  Medications:  I have reviewed the patient's current medications. Prior to Admission:  Prescriptions prior to admission  Medication Sig Dispense Refill  . ALPRAZolam (XANAX) 1 MG tablet Take 1 mg by mouth 3  (three) times daily as needed for sleep or anxiety.      Marland Kitchen amLODipine (NORVASC) 5 MG tablet Take 5 mg by mouth daily.      Marland Kitchen losartan (COZAAR) 100 MG tablet Take 100 mg by mouth daily.      . metFORMIN (GLUCOPHAGE) 500 MG tablet Take 1 tablet (500 mg total) by mouth daily with breakfast.  14 tablet  0  . LORazepam (ATIVAN) 0.5 MG tablet Take 0.5 mg by mouth. May take 1 -2 tabs every 6 hours IF needed for nausea/vomiting. Start with 1 tab and increase to 2 tabs if needed. (Do not take Xanax within 2 hours of taking Ativan).       Scheduled: . [MAR HOLD] amLODipine  5 mg Oral Daily  .  ceFAZolin (ANCEF) IV  2 g Intravenous On Call to OR  . [MAR HOLD] heparin  5,000 Units Subcutaneous Q8H  . [MAR HOLD] insulin aspart  0-15 Units Subcutaneous TID WC  . [MAR HOLD] insulin aspart  0-5 Units Subcutaneous QHS  . [facility] HOLD] insulin glargine  10 Units Subcutaneous Daily   Continuous: . dextrose 5 % and 0.45% NaCl 75 mL/hr at 05/23/13 0838  . lactated ringers 75 mL/hr at 05/23/13 1022   PRN:[MAR HOLD] ALPRAZolam, [MAR HOLD] bisacodyl, [MAR HOLD] dextrose, [MAR HOLD]  HYDROmorphone (DILAUDID) injection, midazolam, [MAR HOLD] oxyCODONE, [MAR HOLD] polyethylene glycol, [MAR HOLD] traZODone Anti-infectives   Start     Dose/Rate Route Frequency Ordered  Stop   05/23/13 0930  ceFAZolin (ANCEF) IVPB 2 g/50 mL premix     2 g 100 mL/hr over 30 Minutes Intravenous On call to O.R. 05/23/13 0924 05/24/13 0559      Results for orders placed during the hospital encounter of 05/21/13 (from the past 48 hour(s))  GLUCOSE, CAPILLARY     Status: Abnormal   Collection Time    05/21/13  6:46 PM      Result Value Range   Glucose-Capillary >600 (*) 70 - 99 mg/dL  BASIC METABOLIC PANEL     Status: Abnormal   Collection Time    05/21/13  7:20 PM      Result Value Range   Sodium 131 (*) 135 - 145 mEq/L   Potassium 4.0  3.5 - 5.1 mEq/L   Chloride 92 (*) 96 - 112 mEq/L   CO2 28  19 - 32 mEq/L   Glucose, Bld 715 (*)  70 - 99 mg/dL   Comment: CRITICAL RESULT CALLED TO, READ BACK BY AND VERIFIED WITH:     L. JOHNSON AT 2025 ON 05/21/13 BY S. VANHOORNE   BUN 23  6 - 23 mg/dL   Creatinine, Ser 0.98  0.50 - 1.10 mg/dL   Calcium 9.6  8.4 - 11.9 mg/dL   GFR calc non Af Amer 59 (*) >90 mL/min   GFR calc Af Amer 68 (*) >90 mL/min   Comment:            The eGFR has been calculated     using the CKD EPI equation.     This calculation has not been     validated in all clinical     situations.     eGFR's persistently     <90 mL/min signify     possible Chronic Kidney Disease.  CBC WITH DIFFERENTIAL     Status: Abnormal   Collection Time    05/21/13  7:20 PM      Result Value Range   WBC 15.0 (*) 4.0 - 10.5 K/uL   RBC 3.73 (*) 3.87 - 5.11 MIL/uL   Hemoglobin 11.0 (*) 12.0 - 15.0 g/dL   HCT 14.7 (*) 82.9 - 56.2 %   MCV 89.0  78.0 - 100.0 fL   MCH 29.5  26.0 - 34.0 pg   MCHC 33.1  30.0 - 36.0 g/dL   RDW 13.0 (*) 86.5 - 78.4 %   Platelets 324  150 - 400 K/uL   Neutrophils Relative % 75  43 - 77 %   Neutro Abs 11.1 (*) 1.7 - 7.7 K/uL   Comment: CORRECTED ON 06/25 AT 2029: PREVIOUSLY REPORTED AS 11.4   Lymphocytes Relative 12  12 - 46 %   Lymphs Abs 1.8  0.7 - 4.0 K/uL   Comment: CORRECTED ON 06/25 AT 2029: PREVIOUSLY REPORTED AS 1.9   Monocytes Relative 9  3 - 12 %   Monocytes Absolute 1.4 (*) 0.1 - 1.0 K/uL   Comment: CORRECTED ON 06/25 AT 2029: PREVIOUSLY REPORTED AS 1.3   Eosinophils Relative 3  0 - 5 %   Eosinophils Absolute 0.5  0.0 - 0.7 K/uL   Comment: CORRECTED ON 06/25 AT 2029: PREVIOUSLY REPORTED AS 0.4   Basophils Relative 1  0 - 1 %   Basophils Absolute 0.2 (*) 0.0 - 0.1 K/uL   Comment: CORRECTED ON 06/25 AT 2029: PREVIOUSLY REPORTED AS 0.1   WBC Morphology ATYPICAL LYMPHOCYTES     Comment: TOXIC GRANULATION  VACUOLATED NEUTROPHILS   RBC Morphology TARGET CELLS     Comment: BURR CELLS     SPHEROCYTES  TROPONIN I     Status: None   Collection Time    05/21/13  7:20 PM      Result  Value Range   Troponin I <0.30  <0.30 ng/mL   Comment:            Due to the release kinetics of cTnI,     a negative result within the first hours     of the onset of symptoms does not rule out     myocardial infarction with certainty.     If myocardial infarction is still suspected,     repeat the test at appropriate intervals.  BLOOD GAS, ARTERIAL     Status: Abnormal (Preliminary result)   Collection Time    05/21/13  7:32 PM      Result Value Range   FIO2 21.00     Delivery systems ROOM AIR     pH, Arterial 7.447  7.350 - 7.450   pCO2 arterial 39.1  35.0 - 45.0 mmHg   pO2, Arterial 84.6  80.0 - 100.0 mmHg   Bicarbonate 26.6 (*) 20.0 - 24.0 mEq/L   TCO2 24.2  0 - 100 mmol/L   Acid-Base Excess 2.8 (*) 0.0 - 2.0 mmol/L   O2 Saturation 96.2     Patient temperature 37.0     Collection site LEFT RADIAL     Drawn by [id number]     Sample type ARTERIAL     Allens test (pass/fail) PENDING  PASS  Bonilla-PEPTIDE     Status: None   Collection Time    05/21/13  7:35 PM      Result Value Range   Bonilla-Peptide 1.45  0.80 - 3.90 ng/mL  URINALYSIS, ROUTINE W REFLEX MICROSCOPIC     Status: Abnormal   Collection Time    05/21/13  8:20 PM      Result Value Range   Color, Urine YELLOW  YELLOW   APPearance CLEAR  CLEAR   Specific Gravity, Urine 1.010  1.005 - 1.030   pH 6.0  5.0 - 8.0   Glucose, UA >1000 (*) NEGATIVE mg/dL   Hgb urine dipstick TRACE (*) NEGATIVE   Bilirubin Urine NEGATIVE  NEGATIVE   Ketones, ur NEGATIVE  NEGATIVE mg/dL   Protein, ur NEGATIVE  NEGATIVE mg/dL   Urobilinogen, UA 0.2  0.0 - 1.0 mg/dL   Nitrite NEGATIVE  NEGATIVE   Leukocytes, UA NEGATIVE  NEGATIVE  URINE CULTURE     Status: None   Collection Time    05/21/13  8:20 PM      Result Value Range   Specimen Description URINE, CLEAN CATCH     Special Requests NONE     Culture  Setup Time 05/21/2013 22:50     Colony Count 80,000 COLONIES/ML     Culture       Value: Multiple bacterial morphotypes present, none  predominant. Suggest appropriate recollection if clinically indicated.   Report Status 05/23/2013 FINAL    URINE MICROSCOPIC-ADD ON     Status: None   Collection Time    05/21/13  8:20 PM      Result Value Range   Squamous Epithelial / LPF RARE  RARE   WBC, UA 0-2  <3 WBC/hpf   RBC / HPF 0-2  <3 RBC/hpf   Bacteria, UA RARE  RARE  GLUCOSE, CAPILLARY     Status: Abnormal  Collection Time    05/21/13  9:03 PM      Result Value Range   Glucose-Capillary 505 (*) 70 - 99 mg/dL   Comment 1 Documented in Chart     Comment 2 Notify RN    GLUCOSE, CAPILLARY     Status: Abnormal   Collection Time    05/21/13 10:42 PM      Result Value Range   Glucose-Capillary 459 (*) 70 - 99 mg/dL  GLUCOSE, CAPILLARY     Status: Abnormal   Collection Time    05/21/13 11:34 PM      Result Value Range   Glucose-Capillary 360 (*) 70 - 99 mg/dL   Comment 1 Documented in Chart     Comment 2 Notify RN    GLUCOSE, CAPILLARY     Status: Abnormal   Collection Time    05/22/13 12:32 AM      Result Value Range   Glucose-Capillary 311 (*) 70 - 99 mg/dL   Comment 1 Documented in Chart     Comment 2 Notify RN    GLUCOSE, CAPILLARY     Status: Abnormal   Collection Time    05/22/13  1:36 AM      Result Value Range   Glucose-Capillary 197 (*) 70 - 99 mg/dL   Comment 1 Documented in Chart     Comment 2 Notify RN    GLUCOSE, CAPILLARY     Status: Abnormal   Collection Time    05/22/13  2:38 AM      Result Value Range   Glucose-Capillary 146 (*) 70 - 99 mg/dL   Comment 1 Documented in Chart     Comment 2 Notify RN    GLUCOSE, CAPILLARY     Status: Abnormal   Collection Time    05/22/13  3:30 AM      Result Value Range   Glucose-Capillary 122 (*) 70 - 99 mg/dL   Comment 1 Documented in Chart     Comment 2 Notify RN    BASIC METABOLIC PANEL     Status: Abnormal   Collection Time    05/22/13  4:20 AM      Result Value Range   Sodium 142  135 - 145 mEq/L   Comment: DELTA CHECK NOTED   Potassium 3.3 (*)  3.5 - 5.1 mEq/L   Chloride 106  96 - 112 mEq/L   CO2 31  19 - 32 mEq/L   Glucose, Bld 101 (*) 70 - 99 mg/dL   BUN 17  6 - 23 mg/dL   Creatinine, Ser 1.61  0.50 - 1.10 mg/dL   Calcium 8.9  8.4 - 09.6 mg/dL   GFR calc non Af Amer 74 (*) >90 mL/min   GFR calc Af Amer 86 (*) >90 mL/min   Comment:            The eGFR has been calculated     using the CKD EPI equation.     This calculation has not been     validated in all clinical     situations.     eGFR's persistently     <90 mL/min signify     possible Chronic Kidney Disease.  CBC     Status: Abnormal   Collection Time    05/22/13  4:20 AM      Result Value Range   WBC 15.0 (*) 4.0 - 10.5 K/uL   RBC 3.59 (*) 3.87 - 5.11 MIL/uL   Hemoglobin 10.5 (*) 12.0 -  15.0 g/dL   HCT 29.5 (*) 62.1 - 30.8 %   MCV 87.7  78.0 - 100.0 fL   MCH 29.2  26.0 - 34.0 pg   MCHC 33.3  30.0 - 36.0 g/dL   RDW 65.7 (*) 84.6 - 96.2 %   Platelets 306  150 - 400 K/uL  GLUCOSE, CAPILLARY     Status: None   Collection Time    05/22/13  4:34 AM      Result Value Range   Glucose-Capillary 99  70 - 99 mg/dL   Comment 1 Documented in Chart     Comment 2 Notify RN    GLUCOSE, CAPILLARY     Status: None   Collection Time    05/22/13  5:36 AM      Result Value Range   Glucose-Capillary 93  70 - 99 mg/dL   Comment 1 Documented in Chart     Comment 2 Notify RN    GLUCOSE, CAPILLARY     Status: Abnormal   Collection Time    05/22/13  7:28 AM      Result Value Range   Glucose-Capillary 164 (*) 70 - 99 mg/dL   Comment 1 Documented in Chart     Comment 2 Notify RN    GLUCOSE, CAPILLARY     Status: Abnormal   Collection Time    05/22/13 11:09 AM      Result Value Range   Glucose-Capillary 247 (*) 70 - 99 mg/dL   Comment 1 Documented in Chart     Comment 2 Notify RN    GLUCOSE, CAPILLARY     Status: Abnormal   Collection Time    05/22/13  5:15 PM      Result Value Range   Glucose-Capillary 289 (*) 70 - 99 mg/dL   Comment 1 Notify RN    GLUCOSE,  CAPILLARY     Status: Abnormal   Collection Time    05/22/13  9:43 PM      Result Value Range   Glucose-Capillary 232 (*) 70 - 99 mg/dL  SURGICAL PCR SCREEN     Status: None   Collection Time    05/23/13  1:00 AM      Result Value Range   MRSA, PCR NEGATIVE  NEGATIVE   Staphylococcus aureus NEGATIVE  NEGATIVE   Comment:            The Xpert SA Assay (FDA     approved for NASAL specimens     in patients over 38 years of age),     is one component of     a comprehensive surveillance     program.  Test performance has     been validated by [employer] for patients greater     than or equal to 88 year old.     It is not intended     to diagnose infection nor to     guide or monitor treatment.  CBC WITH DIFFERENTIAL     Status: Abnormal   Collection Time    05/23/13  5:20 AM      Result Value Range   WBC 12.0 (*) 4.0 - 10.5 K/uL   RBC 3.52 (*) 3.87 - 5.11 MIL/uL   Hemoglobin 10.5 (*) 12.0 - 15.0 g/dL   HCT 95.2 (*) 84.1 - 32.4 %   MCV 87.5  78.0 - 100.0 fL   MCH 29.8  26.0 - 34.0 pg   MCHC 34.1  30.0 -  36.0 g/dL   RDW 78.4 (*) 69.6 - 29.5 %   Platelets 267  150 - 400 K/uL   Neutrophils Relative % 70  43 - 77 %   Neutro Abs 8.4 (*) 1.7 - 7.7 K/uL   Lymphocytes Relative 17  12 - 46 %   Lymphs Abs 2.0  0.7 - 4.0 K/uL   Monocytes Relative 9  3 - 12 %   Monocytes Absolute 1.1 (*) 0.1 - 1.0 K/uL   Eosinophils Relative 4  0 - 5 %   Eosinophils Absolute 0.5  0.0 - 0.7 K/uL   Basophils Relative 0  0 - 1 %   Basophils Absolute 0.1  0.0 - 0.1 K/uL  BASIC METABOLIC PANEL     Status: Abnormal   Collection Time    05/23/13  5:20 AM      Result Value Range   Sodium 135  135 - 145 mEq/L   Comment: DELTA CHECK NOTED   Potassium 3.4 (*) 3.5 - 5.1 mEq/L   Chloride 101  96 - 112 mEq/L   CO2 26  19 - 32 mEq/L   Glucose, Bld 282 (*) 70 - 99 mg/dL   BUN 14  6 - 23 mg/dL   Creatinine, Ser 2.84  0.50 - 1.10 mg/dL   Calcium 8.4  8.4 - 13.2 mg/dL   GFR calc non Af Amer 76 (*) >90  mL/min   GFR calc Af Amer 88 (*) >90 mL/min   Comment:            The eGFR has been calculated     using the CKD EPI equation.     This calculation has not been     validated in all clinical     situations.     eGFR's persistently     <90 mL/min signify     possible Chronic Kidney Disease.  PROTIME-INR     Status: None   Collection Time    05/23/13  5:20 AM      Result Value Range   Prothrombin Time 14.5  11.6 - 15.2 seconds   INR 1.15  0.00 - 1.49  GLUCOSE, CAPILLARY     Status: Abnormal   Collection Time    05/23/13  7:34 AM      Result Value Range   Glucose-Capillary 274 (*) 70 - 99 mg/dL   Comment 1 Notify RN    GLUCOSE, CAPILLARY     Status: Abnormal   Collection Time    05/23/13  9:38 AM      Result Value Range   Glucose-Capillary 227 (*) 70 - 99 mg/dL   Comment 1 Documented in Chart      No results found.  Review of Systems  Constitutional: Positive for weight loss. Negative for fever and chills.  HENT: Negative.   Eyes: Negative.   Respiratory: Negative.   Cardiovascular: Negative.   Gastrointestinal: Negative.   Genitourinary: Negative.   Skin: Negative.        jaundice  Neurological: Negative.   Endo/Heme/Allergies: Negative.   Psychiatric/Behavioral: Negative.    Blood pressure 166/90, pulse 87, temperature 98.2 F (36.8 Bonilla), temperature source Oral, resp. rate 19, height 5\' 6"  (1.676 m), weight 68.493 kg (151 lb), SpO2 100.00%. Physical Exam  Constitutional: She is oriented to person, place, and time. She appears well-developed and well-nourished.  Jaundice  HENT:  Head: Normocephalic and atraumatic.  Eyes: Conjunctivae and EOM are normal. Pupils are equal, round, and reactive to light. Scleral icterus  is present.  Neck: Normal range of motion. Neck supple. No tracheal deviation present.  Cardiovascular: Normal rate, regular rhythm and normal heart sounds.   Respiratory: Effort normal and breath sounds normal.  GI: Soft. Bowel sounds are normal.   Lymphadenopathy:    She has no cervical adenopathy.  Neurological: She is alert and oriented to person, place, and time.  Skin: Skin is warm and dry.    Assessment/Plan: Pancreatic mass. Risks benefits alternatives of placement of a Port-A-Cath were discussed with the patient. Risk including but not limited to risk of bleeding, infection, pneumothorax have been discussed. Her questions and concerns are addressed. Patient will be taken to the operating room for planned placement of the port.  [name] 05/23/2013, 10:26 AM

## 2013-05-23 NOTE — Anesthesia Preprocedure Evaluation (Addendum)
Anesthesia Evaluation  Patient identified by MRN, date of birth, ID band Patient awake  General Assessment Comment:One bite of food this am when Dr. Karilyn Cota stopped her.  Between 8 and 830.  Reviewed: Allergy & Precautions, H&P , NPO status , Patient's Chart, lab work & pertinent test results  Airway Mallampati: II TM Distance: >3 FB     Dental  (+) Edentulous Lower and Edentulous Upper   Pulmonary Current Smoker,  breath sounds clear to auscultation        Cardiovascular Exercise Tolerance: Good hypertension, Pt. on medications Rhythm:Regular Rate:Normal  Patient goes to the gym 5 days a week, treadmill and weights.   Neuro/Psych PSYCHIATRIC DISORDERS Anxiety    GI/Hepatic Jaundiced, denies reflux.   Endo/Other  diabetes, Type 1, Insulin Dependent  Renal/GU      Musculoskeletal   Abdominal   Peds  Hematology   Anesthesia Other Findings   Reproductive/Obstetrics                           Anesthesia Physical Anesthesia Plan  ASA: III  Anesthesia Plan: MAC   Post-op Pain Management:    Induction:   Airway Management Planned: Nasal Cannula  Additional Equipment:   Intra-op Plan:   Post-operative Plan:   Informed Consent: I have reviewed the patients History and Physical, chart, labs and discussed the procedure including the risks, benefits and alternatives for the proposed anesthesia with the patient or authorized representative who has indicated his/her understanding and acceptance.     Plan Discussed with:   Anesthesia Plan Comments:         Anesthesia Quick Evaluation

## 2013-05-23 NOTE — Progress Notes (Signed)
Inpatient Diabetes Program Recommendations  AACE/ADA: New Consensus Statement on Inpatient Glycemic Control (2013)  Target Ranges:  Prepandial:   less than 140 mg/dL      Peak postprandial:   less than 180 mg/dL (1-2 hours)      Critically ill patients:  140 - 180 mg/dL   Results for BLISS, BEHNKE (MRN [id number]) as of 05/23/2013 08:28  Ref. Range 05/22/2013 07:28 05/22/2013 11:09 05/22/2013 17:15 05/22/2013 21:43 05/23/2013 07:34  Glucose-Capillary Latest Range: 70-99 mg/dL 409 (H) 811 (H) 914 (H) 232 (H) 274 (H)   Inpatient Diabetes Program Recommendations Insulin - Basal: Please increase Lantus to 15 units daily. Correction (SSI): Please increase Novolog correction to moderate scale.  Note:  Blood glucose over the past 24 hours has ranged from 164-289 mg/dl.  Noted that patient is on D5 0.45% NS @ 75 ml/hr.  Please re-evaluate IVF to determine if dextrose in IVF is still needed.  In order to improve inpatient glycemic control, please increase Lantus to 15 units daily and increase Novolog correction to moderate scale.  Will continue to follow.  Thanks, Orlando Penner, RN, MSN, CCRN Diabetes Coordinator Inpatient Diabetes Program [phone]

## 2013-05-23 NOTE — Progress Notes (Signed)
Pt a/o.vss. Saline lock removed. Discharge instructions given. prescriptions given. Diabetes education done. Pt verbalized understanding of discharge instructions. Pt left floor via wheelchair with nursing staff and family members.

## 2013-05-23 NOTE — Anesthesia Postprocedure Evaluation (Signed)
  Anesthesia Post-op Note  Patient: [name]  Procedure(s) Performed: Procedure(s): INSERTION PORT-A-CATH (Left)  Patient Location: PACU  Anesthesia Type:MAC  Level of Consciousness: awake, alert , oriented and patient cooperative  Airway and Oxygen Therapy: Patient Spontanous Breathing and Patient connected to face mask oxygen  Post-op Pain: none  Post-op Assessment: Post-op Vital signs reviewed, Patient's Cardiovascular Status Stable, Respiratory Function Stable, Patent Airway and Pain level controlled  Post-op Vital Signs: Reviewed and stable  Complications: No apparent anesthesia complications

## 2013-05-23 NOTE — Op Note (Signed)
Patient:  [name]  DOB:  23-Dec-1945  MRN:  [id number]   Preop Diagnosis:  Pancreatic mass  Postop Diagnosis:  The same  Procedure:  Left subclavian vein Port-A-Cath insertion with intraoperative fluoroscopy and surgeon interpretation  Surgeon:  Dr. Tilford Pillar  Anes:  MAC sedation, 1% lidocaine plain for local  Indications:  Patient is a 67 year old female presented to [facility] with a pancreatic mass. Plans are to initiate chemotherapy on Monday. She was advised to have a Port-A-Cath inserted. Risks benefits alternatives were discussed. Patient consented for the planned procedure.  Procedure note:  Patient was taken to the operator is placed in supine position the or table time the MAC sedation was administered. Once patient was sedated her left chest wall and neck were prepped with chlorhexidine solution and draped in standard fashion. Time out was performed. Local anesthetic is instilled along the planned course of venipuncture after the patient's placed into a Trendelenburg position. An 18-gauge introducer needle is then utilized to identify the left subclavian vein with good venous return. The J-wire is advanced. Fluoroscopy was utilized to confirm the course of the J-wire into the superior vena cava. The J-wire was clamped to the drapes. The local anesthetic is instilled along the planned site of port creation. A 15 blade scalpel utilized to create the initial skin incision. The port is enlarged using combination of blunt digital and electrocautery dissection. Once adequately sized the catheter is advanced from the port sites with a stab incision site using a subcuticular tunneling device. The dilator and insertion sheath was then advanced down the J-wire under standard fashion. The lumen of the dilator was smooth without any difficulty. Fluoroscopy confirmed the transition of the dilator insertion sheath was without difficulty. At this point the J-wire and dilator removed  in standard fashion. The catheter was advanced down the insertion sheath. It was confirmed to be in the superior vena cava. The sheath was split and removed in standard fashion. The catheter was trimmed at 20 cm (to note the tach and placed the catheter in the reverse direction on the tunneling device so therefore the catheter was trimmed at the 26 cm mark although overall length was 20 cm). The catheter was attached to the port. The port was aspirated with a curet needle and good venous return. 3000 units of heparin was instilled. The course of the catheter was inspected there is no sharp angulations or kinking. The port was secured to the prepectoralis fascia using a 3-0 Vicryl. This deep subcuticular tissues and reapproximated using a 3-0 Vicryl and running continuous fashion. Skin edges reapproximated both incision sites with a 4-0 Monocryl in running subcuticular suture. The skin was washed dried moist dry towel. Benzoin is applied around incision. Half-inch are suture placed. The drapes removed patient left come out of sedation was transferred to the PACU in stable condition. At the conclusion of procedure all instrument, sponge, needle counts are correct. Patient tolerated procedure extremely well.  Complications:  None apparent  EBL:  Minimal  Specimen:  None

## 2013-05-24 NOTE — Discharge Summary (Signed)
[name], [name]                ACCOUNT NO.:  [id number]  MEDICAL RECORD NO.:  [id number]  LOCATION:  A333                          FACILITY:  [facility]  PHYSICIAN:  Mila Homer. [name], M.D.DATE OF BIRTH:  1946/04/29  DATE OF ADMISSION:  05/21/2013 DATE OF DISCHARGE:  06/27/2014LH                              DISCHARGE SUMMARY   This 67 year old was admitted to the hospital with a sugar of 700.  She had a benign 3-day hospitalization.  She was put on the glucometer protocol and IV fluids and sugar rapidly dropped down to the 100-200 range.  Lantus was added and she was discharged on this.  At the time of dictation she is due for a port to be placed to start chemotherapy for her metastatic pancreatic CA and after this she will be discharged.  Results in the hospital showed admission blood gas on room air of 7.44, pCO2 39, pO2 84.  Admission sodium was 128 with a chloride of 89 with a glucose of 765 at that point.  Alk phos was elevated at 318.  Albumin was slightly low at 2.9.  AST, ALT were mildly elevated, and a bilirubin of 3.1.  These values were followed.  Sodium increased to 142, recheck 135; potassium of 3.4.  Admission white cell count was 15,000 and rechecked the day of discharge at 12,000 with 70% neutrophils.  INR was 1.15.  Glucose dropped to the 100 range as mentioned and her C-peptide was 1.45.  Her CA 19-9 tumor marker was 140,000.  This had increased from her prior test.  UA was essentially negative.  MRSA and staph screens were negative.  EKG showed a normal sinus rhythm.  In the hospital she was continued on alprazolam, amlodipine, and put on trazodone for sleep.  She had education on how to give injections of insulin, how to check her sugar.  She also has several family members with diabetes who will be able to assist her with this on discharge.  FINAL DISCHARGE DIAGNOSES: 1. Hyperglycemia. 2. Uncontrolled type 2 diabetes. 3. Metastatic pancreatic  carcinoma. 4. Benign essential hypertension.  She will be seen in the office in 3 days for followup and bring her sugar monitoring sheet at that time.     Mila Homer. [name], M.D.     SDK/MEDQ  D:  05/23/2013  T:  05/24/2013  Job:  [id number]

## 2013-05-26 ENCOUNTER — Encounter (HOSPITAL_BASED_OUTPATIENT_CLINIC_OR_DEPARTMENT_OTHER): Payer: Medicare Other

## 2013-05-26 ENCOUNTER — Other Ambulatory Visit (HOSPITAL_COMMUNITY): Payer: Self-pay | Admitting: Oncology

## 2013-05-26 ENCOUNTER — Encounter (HOSPITAL_COMMUNITY): Payer: Self-pay | Admitting: General Surgery

## 2013-05-26 ENCOUNTER — Telehealth (HOSPITAL_COMMUNITY): Payer: Self-pay | Admitting: *Deleted

## 2013-05-26 ENCOUNTER — Encounter (HOSPITAL_COMMUNITY): Payer: Medicare Other

## 2013-05-26 VITALS — BP 168/94 | HR 96 | Temp 96.0°F | Resp 18 | Wt 153.2 lb

## 2013-05-26 DIAGNOSIS — E876 Hypokalemia: Secondary | ICD-10-CM

## 2013-05-26 DIAGNOSIS — C7800 Secondary malignant neoplasm of unspecified lung: Secondary | ICD-10-CM

## 2013-05-26 DIAGNOSIS — C259 Malignant neoplasm of pancreas, unspecified: Secondary | ICD-10-CM

## 2013-05-26 DIAGNOSIS — C787 Secondary malignant neoplasm of liver and intrahepatic bile duct: Secondary | ICD-10-CM

## 2013-05-26 DIAGNOSIS — Z5111 Encounter for antineoplastic chemotherapy: Secondary | ICD-10-CM

## 2013-05-26 LAB — COMPREHENSIVE METABOLIC PANEL
Alkaline Phosphatase: 220 U/L — ABNORMAL HIGH (ref 39–117)
BUN: 15 mg/dL (ref 6–23)
CO2: 28 mEq/L (ref 19–32)
Chloride: 99 mEq/L (ref 96–112)
GFR calc Af Amer: 77 mL/min — ABNORMAL LOW (ref >90)
Glucose, Bld: 260 mg/dL — ABNORMAL HIGH (ref 70–99)
Potassium: 3.3 mEq/L — ABNORMAL LOW (ref 3.5–5.1)
Total Bilirubin: 2.3 mg/dL — ABNORMAL HIGH (ref 0.3–1.2)

## 2013-05-26 LAB — CBC WITH DIFFERENTIAL/PLATELET
Hemoglobin: 10.4 g/dL — ABNORMAL LOW (ref 12.0–15.0)
Lymphocytes Relative: 15 % (ref 12–46)
Lymphs Abs: 1.9 10*3/uL (ref 0.7–4.0)
MCH: 29.4 pg (ref 26.0–34.0)
Monocytes Relative: 11 % (ref 3–12)
Neutro Abs: 8.9 10*3/uL — ABNORMAL HIGH (ref 1.7–7.7)
Neutrophils Relative %: 71 % (ref 43–77)
RBC: 3.54 MIL/uL — ABNORMAL LOW (ref 3.87–5.11)

## 2013-05-26 MED ORDER — OXALIPLATIN CHEMO INJECTION 100 MG/20ML
85.0000 mg/m2 | Freq: Once | INTRAVENOUS | Status: AC
  Administered 2013-05-26: 150 mg via INTRAVENOUS
  Filled 2013-05-26: qty 30

## 2013-05-26 MED ORDER — SODIUM CHLORIDE 0.9 % IV SOLN
Freq: Once | INTRAVENOUS | Status: AC
  Administered 2013-05-26: 16 mg via INTRAVENOUS
  Filled 2013-05-26: qty 8

## 2013-05-26 MED ORDER — DEXAMETHASONE SODIUM PHOSPHATE 10 MG/ML IJ SOLN: 20.0000 mg | Freq: Once | INTRAMUSCULAR | Status: DC

## 2013-05-26 MED ORDER — HEPARIN SOD (PORK) LOCK FLUSH 100 UNIT/ML IV SOLN
500.0000 [IU] | Freq: Once | INTRAVENOUS | Status: DC | PRN
  Filled 2013-05-26: qty 5

## 2013-05-26 MED ORDER — SODIUM CHLORIDE 0.9 % IJ SOLN
10.0000 mL | INTRAMUSCULAR | Status: DC | PRN
  Administered 2013-05-26: 10 mL
  Filled 2013-05-26: qty 10

## 2013-05-26 MED ORDER — FLUOROURACIL CHEMO INJECTION 2.5 GM/50ML
400.0000 mg/m2 | Freq: Once | INTRAVENOUS | Status: AC
  Administered 2013-05-26: 700 mg via INTRAVENOUS
  Filled 2013-05-26: qty 14

## 2013-05-26 MED ORDER — LEUCOVORIN CALCIUM INJECTION 100 MG
20.0000 mg/m2 | Freq: Once | INTRAMUSCULAR | Status: AC
  Administered 2013-05-26: 34 mg via INTRAVENOUS
  Filled 2013-05-26: qty 1.7

## 2013-05-26 MED ORDER — LEUCOVORIN CALCIUM INJECTION 350 MG: 400.0000 mg/m2 | Freq: Once | INTRAVENOUS | Status: DC

## 2013-05-26 MED ORDER — SODIUM CHLORIDE 0.9 % IV SOLN
2400.0000 mg/m2 | INTRAVENOUS | Status: DC
  Administered 2013-05-26: 4200 mg via INTRAVENOUS
  Filled 2013-05-26: qty 84

## 2013-05-26 MED ORDER — SODIUM CHLORIDE 0.9 % IV SOLN: 16.0000 mg | Freq: Once | INTRAVENOUS | Status: DC

## 2013-05-26 MED ORDER — POTASSIUM CHLORIDE CRYS ER 20 MEQ PO TBCR: 20.0000 meq | EXTENDED_RELEASE_TABLET | Freq: Two times a day (BID) | ORAL | Status: DC

## 2013-05-26 MED ORDER — IRINOTECAN HCL CHEMO INJECTION 100 MG/5ML
180.0000 mg/m2 | Freq: Once | INTRAVENOUS | Status: AC
  Administered 2013-05-26: 314 mg via INTRAVENOUS
  Filled 2013-05-26: qty 5.23

## 2013-05-26 NOTE — Progress Notes (Signed)
Chemo teaching done and consent signed for 5FU, leucovorin, Irinotecan, and Oxaliplatin. Pt has already received medications. Patient to be following blood sugars BID from chemo date through Saturday (6 days worth) to see how blood sugars do. Dr. [name] to dose insulin and glycemic agents. Pt to see him today.

## 2013-05-26 NOTE — Progress Notes (Signed)
Tolerated chemo well.  Home with continuous infusion pump with 4fu  Infusing  without difficulty.

## 2013-05-27 ENCOUNTER — Inpatient Hospital Stay (HOSPITAL_COMMUNITY): Payer: Medicare Other

## 2013-05-27 ENCOUNTER — Telehealth (HOSPITAL_COMMUNITY): Payer: Self-pay | Admitting: *Deleted

## 2013-05-27 ENCOUNTER — Telehealth (HOSPITAL_COMMUNITY): Payer: Self-pay

## 2013-05-27 NOTE — Telephone Encounter (Signed)
Did well with chemo.  Has had problems managing blood glucose levels, but otherwise is fine.  Urged to call clinic with problems, questions, or concerns.

## 2013-05-27 NOTE — Telephone Encounter (Signed)
Patient instructed by Dr. [name] to increase Lantus to 34 units tonight. Patient to call Dr. [name] back tomorrow to let him know about the blood sugar. She is to call me also.

## 2013-05-27 NOTE — Telephone Encounter (Signed)
Patient called to give me her blood sugar readings for the day. She states that throughout the day @ various times her blood sugar was  287, 236, 518, and 529. Dr. [name] had her increase her Lantus to 26 units nightly. He told her not to restart her Metformin per patient. He told her to increase her Lantus to 30 units tonight if her blood sugar was a little high. I told her to call him since her blood sugar was in the 500s. She said she would. Patient to call me back with his instructions.

## 2013-05-28 ENCOUNTER — Encounter (HOSPITAL_COMMUNITY): Payer: Medicare Other | Attending: Oncology

## 2013-05-28 ENCOUNTER — Encounter (HOSPITAL_COMMUNITY): Payer: Medicare Other

## 2013-05-28 DIAGNOSIS — C259 Malignant neoplasm of pancreas, unspecified: Secondary | ICD-10-CM

## 2013-05-28 DIAGNOSIS — Z452 Encounter for adjustment and management of vascular access device: Secondary | ICD-10-CM

## 2013-05-28 MED ORDER — HEPARIN SOD (PORK) LOCK FLUSH 100 UNIT/ML IV SOLN
500.0000 [IU] | Freq: Once | INTRAVENOUS | Status: AC | PRN
  Administered 2013-05-28: 500 [IU]
  Filled 2013-05-28: qty 5

## 2013-05-28 MED ORDER — HEPARIN SOD (PORK) LOCK FLUSH 100 UNIT/ML IV SOLN
INTRAVENOUS | Status: AC
  Filled 2013-05-28: qty 5

## 2013-05-28 MED ORDER — SODIUM CHLORIDE 0.9 % IJ SOLN
10.0000 mL | INTRAMUSCULAR | Status: DC | PRN
  Administered 2013-05-28: 10 mL
  Filled 2013-05-28: qty 10

## 2013-05-28 NOTE — Progress Notes (Signed)
[name] presented for Portacath deaccess and flush after continous 5 fu. Portacath located rt chest wall deaccessed. Good blood return present. Portacath flushed with 20ml NS and 500U/64ml Heparin and needle removed intact. Procedure without incident. Patient tolerated procedure well. No problems post chemo., Rosana Berger RN talked with patient re: glucose and constipation.

## 2013-05-29 ENCOUNTER — Emergency Department (HOSPITAL_COMMUNITY)
Admission: EM | Admit: 2013-05-29 | Discharge: 2013-05-29 | Disposition: A | Discharge status: Home / Self Care | Payer: Medicare Other | Attending: Emergency Medicine | Admitting: Emergency Medicine

## 2013-05-29 ENCOUNTER — Encounter (HOSPITAL_COMMUNITY): Payer: Self-pay

## 2013-05-29 ENCOUNTER — Telehealth (HOSPITAL_COMMUNITY): Payer: Self-pay | Admitting: *Deleted

## 2013-05-29 DIAGNOSIS — Z794 Long term (current) use of insulin: Secondary | ICD-10-CM | POA: Insufficient documentation

## 2013-05-29 DIAGNOSIS — R7309 Other abnormal glucose: Secondary | ICD-10-CM | POA: Insufficient documentation

## 2013-05-29 DIAGNOSIS — F3289 Other specified depressive episodes: Secondary | ICD-10-CM | POA: Insufficient documentation

## 2013-05-29 DIAGNOSIS — Z8509 Personal history of malignant neoplasm of other digestive organs: Secondary | ICD-10-CM | POA: Insufficient documentation

## 2013-05-29 DIAGNOSIS — F329 Major depressive disorder, single episode, unspecified: Secondary | ICD-10-CM | POA: Insufficient documentation

## 2013-05-29 DIAGNOSIS — Z79899 Other long term (current) drug therapy: Secondary | ICD-10-CM | POA: Insufficient documentation

## 2013-05-29 DIAGNOSIS — I10 Essential (primary) hypertension: Secondary | ICD-10-CM | POA: Insufficient documentation

## 2013-05-29 DIAGNOSIS — E119 Type 2 diabetes mellitus without complications: Secondary | ICD-10-CM | POA: Insufficient documentation

## 2013-05-29 DIAGNOSIS — R739 Hyperglycemia, unspecified: Secondary | ICD-10-CM

## 2013-05-29 DIAGNOSIS — C7889 Secondary malignant neoplasm of other digestive organs: Secondary | ICD-10-CM | POA: Insufficient documentation

## 2013-05-29 DIAGNOSIS — Z87891 Personal history of nicotine dependence: Secondary | ICD-10-CM | POA: Insufficient documentation

## 2013-05-29 DIAGNOSIS — Z9221 Personal history of antineoplastic chemotherapy: Secondary | ICD-10-CM | POA: Insufficient documentation

## 2013-05-29 DIAGNOSIS — Z8739 Personal history of other diseases of the musculoskeletal system and connective tissue: Secondary | ICD-10-CM | POA: Insufficient documentation

## 2013-05-29 LAB — URINALYSIS, ROUTINE W REFLEX MICROSCOPIC
Glucose, UA: >1000 mg/dL — AB
Leukocytes, UA: NEGATIVE
Protein, ur: NEGATIVE mg/dL
Specific Gravity, Urine: <1.005 — ABNORMAL LOW (ref 1.005–1.030)
pH: 6 (ref 5.0–8.0)

## 2013-05-29 LAB — COMPREHENSIVE METABOLIC PANEL
AST: 35 U/L (ref 0–37)
Albumin: 2.8 g/dL — ABNORMAL LOW (ref 3.5–5.2)
Chloride: 92 mEq/L — ABNORMAL LOW (ref 96–112)
Creatinine, Ser: 1.32 mg/dL — ABNORMAL HIGH (ref 0.50–1.10)
Total Bilirubin: 2.1 mg/dL — ABNORMAL HIGH (ref 0.3–1.2)
Total Protein: 7.5 g/dL (ref 6.0–8.3)

## 2013-05-29 LAB — URINE MICROSCOPIC-ADD ON

## 2013-05-29 LAB — CBC WITH DIFFERENTIAL/PLATELET
Basophils Absolute: 0 10*3/uL (ref 0.0–0.1)
Basophils Relative: 0 % (ref 0–1)
Eosinophils Absolute: 0.1 10*3/uL (ref 0.0–0.7)
HCT: 30.1 % — ABNORMAL LOW (ref 36.0–46.0)
MCH: 29.8 pg (ref 26.0–34.0)
MCHC: 33.6 g/dL (ref 30.0–36.0)
Monocytes Absolute: 0.4 10*3/uL (ref 0.1–1.0)
Neutro Abs: 13 10*3/uL — ABNORMAL HIGH (ref 1.7–7.7)
Neutrophils Relative %: 91 % — ABNORMAL HIGH (ref 43–77)
RDW: 15.8 % — ABNORMAL HIGH (ref 11.5–15.5)

## 2013-05-29 LAB — KETONES, QUALITATIVE: Acetone, Bld: NEGATIVE

## 2013-05-29 LAB — GLUCOSE, CAPILLARY
Glucose-Capillary: >600 mg/dL (ref 70–99)
Glucose-Capillary: 351 mg/dL — ABNORMAL HIGH (ref 70–99)

## 2013-05-29 MED ORDER — SODIUM CHLORIDE 0.9 % IV SOLN: INTRAVENOUS | Status: DC

## 2013-05-29 MED ORDER — HEPARIN SOD (PORK) LOCK FLUSH 100 UNIT/ML IV SOLN
INTRAVENOUS | Status: AC
  Filled 2013-05-29: qty 5

## 2013-05-29 MED ORDER — SODIUM CHLORIDE 0.9 % IV BOLUS (SEPSIS)
2000.0000 mL | Freq: Once | INTRAVENOUS | Status: AC
  Administered 2013-05-29: 2000 mL via INTRAVENOUS

## 2013-05-29 MED ORDER — SODIUM CHLORIDE 0.9 % IV BOLUS (SEPSIS)
1000.0000 mL | Freq: Once | INTRAVENOUS | Status: AC
  Administered 2013-05-29: 1000 mL via INTRAVENOUS

## 2013-05-29 MED ORDER — INSULIN ASPART 100 UNIT/ML ~~LOC~~ SOLN
10.0000 [IU] | Freq: Once | SUBCUTANEOUS | Status: AC
  Administered 2013-05-29: 10 [IU] via SUBCUTANEOUS

## 2013-05-29 NOTE — Telephone Encounter (Signed)
Patient called to tell me that her blood sugar was greater than 500 around 3pm today. I tried to contact Dr. [name] via pager per operator but he did not call back. I have called Charmelle back and she checked her blood sugar @ 1713 and the blood sugar was 599. Patient just took her last dose of Dexamethasone 8mg  at 1630. I instructed patient to go to ER and talked to ER Charge Nurse regarding this patient coming to ER via private vehicle.

## 2013-05-29 NOTE — ED Notes (Signed)
Implanted port accessed using sterile technique. Blood obtained, labeled, and sent to lab.

## 2013-05-29 NOTE — ED Notes (Signed)
Pt states she has been on decadron and her blood sugar was 599 about an hour ago. States she ws told to come here by her hospice nurse

## 2013-05-29 NOTE — ED Notes (Signed)
No acute distress, up to bathroom several times to urinate.

## 2013-05-29 NOTE — ED Provider Notes (Signed)
History    CSN: [id number] Arrival date & time 05/29/13  1735  First MD Initiated Contact with Patient 05/29/13 1750     Chief Complaint  Patient presents with  . Hyperglycemia   (Consider location/radiation/quality/duration/timing/severity/associated sxs/prior Treatment) HPI 67 year old female is metastatic pancreatic cancer is on chemotherapy and Decadron for that she has diabetes she is here for short-acting insulin due to asymptomatic hyperglycemia again today. She denies fever or altered mental status chest pain cough shortness breath abdominal pain nausea vomiting dysuria polyuria polydipsia or other concerns. She states she feels great she is laughing and telling jokes. However since she had a blood sugar of 599 just prior to arrival she was instructed by her hospice nurse to come to the ED for short-acting insulin and recheck her blood sugar. Past Medical History  Diagnosis Date  . Diabetes mellitus without complication   . Hypertension   . Anxiety   . Arthritis   . Pancreatic cancer    Past Surgical History  Procedure Laterality Date  . Abdominal hysterectomy    . Colonoscopy  2006    Dr. Jena Gauss, polyp, benign  . Ercp  05/10/2013    Procedure: ENDOSCOPIC RETROGRADE CHOLANGIOPANCREATOGRAPHY (ERCP)(DIFFICULT CANNULATION) ;  Surgeon: Malissa Hippo, MD;  Location: [facility];  Service: Endoscopy;;  . Biliary stent placement  05/10/2013    Procedure: BILIARY STENT PLACEMENT ( X WALLFLEX STENT);  Surgeon: Malissa Hippo, MD;  Location: [facility];  Service: Endoscopy;;  . Sphincterotomy  05/10/2013    Procedure: SPHINCTEROTOMY (PRECUT MADE WITH NEEDLE KNIFE);  Surgeon: Malissa Hippo, MD;  Location: [facility];  Service: Endoscopy;;  . Liver biopsy  05/12/2013  . Portacath placement Left 05/23/2013    Procedure: INSERTION PORT-A-CATH;  Surgeon: Fabio Bering, MD;  Location: [facility];  Service: General;  Laterality: Left;   Family History  Problem Relation Age of Onset  . Colon  cancer Neg Hx   . Liver disease Neg Hx   . Bone cancer Brother   . Cancer Brother   . Hypertension Mother    History  Substance Use Topics  . Smoking status: Former Smoker -- 1.00 packs/day for 20 years  . Smokeless tobacco: Former Neurosurgeon    Quit date: 03/27/2013  . Alcohol Use: No   OB History   Grav Para Term Preterm Abortions TAB SAB Ect Mult Living   5 5 5       5      Review of Systems 10 Systems reviewed and are negative for acute change except as noted in the HPI. Allergies  Review of patient's allergies indicates no known allergies.  Home Medications   Current Outpatient Rx  Name  Route  Sig  Dispense  Refill  . ALPRAZolam (XANAX) 1 MG tablet   Oral   Take 1 mg by mouth 3 (three) times daily as needed for sleep or anxiety.         Marland Kitchen amLODipine (NORVASC) 5 MG tablet   Oral   Take 5 mg by mouth daily.         Marland Kitchen dexamethasone (DECADRON) 4 MG tablet      Starting the day after chemo, take 2 tabs in the am and 2 tabs in the pm for 3 days. Take with food.   24 tablet   3   . insulin glargine (LANTUS) 100 UNIT/ML injection   Subcutaneous   Inject 40 Units into the skin at bedtime.         Marland Kitchen  losartan (COZAAR) 100 MG tablet   Oral   Take 100 mg by mouth daily.         . ondansetron (ZOFRAN) 8 MG tablet      The day after chemo, take 1 tab in am and 1 tab in pm for 3 days. Then may take 1 tab two times a day IF needed for nausea/vomiting.   60 tablet   2   . potassium chloride SA (K-DUR,KLOR-CON) 20 MEQ tablet   Oral   Take 1 tablet (20 mEq total) by mouth 2 (two) times daily.   30 tablet   0   . lidocaine-prilocaine (EMLA) cream   Topical   Apply topically as needed. Apply a quarter size amount to port site 1 hour prior to chemo. Do not rub in. Cover with plastic.   30 g   3   . loperamide (IMODIUM A-D) 2 MG tablet      Take 2 tab at onset of diarrhea, then 1 tab q2h until 12 hr have passed without a BM.  May take 2 tab q4h qhs. If diarrhea  recurs repeat.   30 tablet   3   . LORazepam (ATIVAN) 0.5 MG tablet   Oral   Take 0.5 mg by mouth. May take 1 -2 tabs every 6 hours IF needed for nausea/vomiting. Start with 1 tab and increase to 2 tabs if needed. (Do not take Xanax within 2 hours of taking Ativan).          BP 154/83  Pulse 58  Temp(Src) 98.5 F (36.9 C) (Oral)  Resp 20  Ht 5' (1.524 m)  Wt 155 lb (70.308 kg)  BMI 30.27 kg/m2  SpO2 98% Physical Exam  Nursing note and vitals reviewed. Constitutional:  Awake, alert, nontoxic appearance.  HENT:  Head: Atraumatic.  Eyes: Right eye exhibits no discharge. Left eye exhibits no discharge.  Neck: Neck supple.  Cardiovascular: Normal rate and regular rhythm.   No murmur heard. Pulmonary/Chest: Effort normal and breath sounds normal. No respiratory distress. She has no wheezes. She has no rales. She exhibits no tenderness.  Abdominal: Soft. Bowel sounds are normal. She exhibits no distension and no mass. There is no tenderness. There is no rebound and no guarding.  Musculoskeletal: She exhibits no edema and no tenderness.  Baseline ROM, no obvious new focal weakness.  Neurological: She is alert.  Mental status and motor strength appears baseline for patient and situation.  Skin: No rash noted.  Psychiatric: She has a normal mood and affect.    ED Course  Procedures (including critical care time) Pt stable in ED with no significant deterioration in condition.Patient informed of clinical course, understand medical decision-making process, and agree with plan. Labs Reviewed  GLUCOSE, CAPILLARY - Abnormal; Notable for the following:    Glucose-Capillary >600 (*)    All other components within normal limits  CBC WITH DIFFERENTIAL - Abnormal; Notable for the following:    WBC 14.3 (*)    RBC 3.39 (*)    Hemoglobin 10.1 (*)    HCT 30.1 (*)    RDW 15.8 (*)    Neutrophils Relative % 91 (*)    Neutro Abs 13.0 (*)    Lymphocytes Relative 6 (*)    All other  components within normal limits  COMPREHENSIVE METABOLIC PANEL - Abnormal; Notable for the following:    Sodium 129 (*)    Chloride 92 (*)    Glucose, Bld 690 (*)    BUN 38 (*)  Creatinine, Ser 1.32 (*)    Albumin 2.8 (*)    ALT 53 (*)    Alkaline Phosphatase 217 (*)    Total Bilirubin 2.1 (*)    GFR calc non Af Amer 42 (*)    GFR calc Af Amer 48 (*)    All other components within normal limits  URINALYSIS, ROUTINE W REFLEX MICROSCOPIC - Abnormal; Notable for the following:    Specific Gravity, Urine <1.005 (*)    Glucose, UA >1000 (*)    Hgb urine dipstick TRACE (*)    All other components within normal limits  GLUCOSE, CAPILLARY - Abnormal; Notable for the following:    Glucose-Capillary 535 (*)    All other components within normal limits  GLUCOSE, CAPILLARY - Abnormal; Notable for the following:    Glucose-Capillary 351 (*)    All other components within normal limits  KETONES, QUALITATIVE  URINE MICROSCOPIC-ADD ON   No results found. 1. Hyperglycemia without ketosis     MDM  I doubt any other EMC precluding discharge at this time including, but not necessarily limited to the following:DKA.  Hurman Horn, MD 05/30/13 1210

## 2013-05-29 NOTE — ED Notes (Signed)
Patient ambulatory to restroom with steady gait. No distress.

## 2013-05-29 NOTE — ED Notes (Signed)
Porta cath flushed with heparin flush, removed and bandaid placed on site.

## 2013-06-01 NOTE — Telephone Encounter (Signed)
Yes, reduce to 4 mg.  See if Dr. [name] can give her a sliding scale insulin that she can take 3-4 times per day.  We need to control her hyperglycemia better.  She may need a consult to endocrinology, Dr. Fransico Him.

## 2013-06-02 NOTE — Telephone Encounter (Signed)
-----   Message from Lennice Sites, RN sent at 05/29/2013 5:20 PM -----     Elijah Birk - do you think we need to cut her dex back by 1 day? Or decrease it from 8 to 4mg ? She has done good this week post chemo...                   [facility] Size     Small Medium Large Extra Extra Large             [name] Description: 67 year old female  05/29/2013 Telephone Provider: Lennice Sites, RN  MRN: [id number] Department: [facility]                Call Documentation    Ellouise Newer, PA-C at 06/01/2013 9:58 PM    Status: Signed             Yes, reduce to 4 mg. See if Dr. [name] can give her a sliding scale insulin that she can take 3-4 times per day. We need to control her hyperglycemia better. She may need a consult to endocrinology, Dr. Fransico Him.        Lennice Sites, RN at 05/29/2013 5:20 PM    Status: Signed             Patient called to tell me that her blood sugar was greater than 500 around 3pm today. I tried to contact Dr. [name] via pager per operator but he did not call back. I have called [name] back and she checked her blood sugar @ 1713 and the blood sugar was 599. Patient just took her last dose of Dexamethasone 8mg  at 1630. I instructed patient to go to ER and talked to ER Charge Nurse regarding this patient coming to ER via private vehicle.          Spoke to [place] this am and sugar was 200 this am. She had a low blood sugar on Sunday am after taking 40 units on Saturday night. She states she was starving and had ate a lot last night and was up during the night eating. She is down to 34 units lantus every pm. Dr. [name] out of town til  7/21.

## 2013-06-05 ENCOUNTER — Encounter (HOSPITAL_BASED_OUTPATIENT_CLINIC_OR_DEPARTMENT_OTHER): Payer: Medicare Other

## 2013-06-05 ENCOUNTER — Encounter (HOSPITAL_COMMUNITY): Payer: Self-pay

## 2013-06-05 ENCOUNTER — Encounter (HOSPITAL_COMMUNITY): Payer: Self-pay | Admitting: Oncology

## 2013-06-05 VITALS — Wt 151.0 lb

## 2013-06-05 DIAGNOSIS — C259 Malignant neoplasm of pancreas, unspecified: Secondary | ICD-10-CM

## 2013-06-05 DIAGNOSIS — C787 Secondary malignant neoplasm of liver and intrahepatic bile duct: Secondary | ICD-10-CM

## 2013-06-05 NOTE — Patient Instructions (Addendum)
[facility] Discharge Instructions  RECOMMENDATIONS MADE BY THE CONSULTANT AND ANY TEST RESULTS WILL BE SENT TO YOUR REFERRING PHYSICIAN.  EXAM FINDINGS BY THE PHYSICIAN TODAY AND SIGNS OR SYMPTOMS TO REPORT TO CLINIC OR PRIMARY PHYSICIAN: Exam and discussion by Dr. Sherrlyn Hock.  Keep your blood sugars under control.  The numbness in your foot is probably related to your high blood sugars.  MEDICATIONS PRESCRIBED:  You can get either Zantac or Pepcid over the counter and take it for your reflux.  (Ask your pharmacist to help you choose) Use antibiotic cream on the irritated areas on your bottom.  INSTRUCTIONS GIVEN AND DISCUSSED: Report fevers, uncontrolled nausea, vomiting or diarrhea.  SPECIAL INSTRUCTIONS/FOLLOW-UP: As scheduled.  Thank you for choosing [facility] to provide your oncology and hematology care.  To afford each patient quality time with our providers, please arrive at least 15 minutes before your scheduled appointment time.  With your help, our goal is to use those 15 minutes to complete the necessary work-up to ensure our physicians have the information they need to help with your evaluation and healthcare recommendations.    Effective January 1st, 2014, we ask that you re-schedule your appointment with our physicians should you arrive 10 or more minutes late for your appointment.  We strive to give you quality time with our providers, and arriving late affects you and other patients whose appointments are after yours.    Again, thank you for choosing [facility].  Our hope is that these requests will decrease the amount of time that you wait before being seen by our physicians.       _____________________________________________________________  Should you have questions after your visit to [facility], please contact our office at [phone] between the hours of 8:30 a.m. and 5:00 p.m.  Voicemails left after 4:30  p.m. will not be returned until the following business day.  For prescription refill requests, have your pharmacy contact our office with your prescription refill request.

## 2013-06-05 NOTE — Progress Notes (Signed)
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 Started FOLFIRINOX chemotherapy June 2014.  CURRENT THERAPY: Chemotherapy   INTERVAL HISTORY:  Patient returns for continued oncology evaluation, she has received cycle 1 chemotherapy with FOLFIRINOX and states that she is doing fairly steady. She did have increased fatigue. She did visit here due to high blood sugar up to 690 but states that currently blood sugar monitored at home is consistently 180 or less. She has had some mild numbness at the base of the right toe and top of foot, otherwise denies any other paresthesias or difficulty in use of hands, no imbalance or difficulty walking either. She has had some localised burning sensation in the pubic area with mild rash. Otherwise no dysuria or hematuria. She had loose stools x4 yesterday which has stopped after taking Imodium. She complains of heartburn. Otherwise no nausea or vomiting. No new pain issues. No fevers or chills. No mouth sores.  Medications: list reviewed, see nursing chart record for list.   Past Medical History  #1 metastatic adenocarcinoma pancreas to liver and lung as well as lymph nodes, complicated by obstructive jaundice requiring stent placement #2 long-standing smoking history starting at age 89, half pack a day #3 hypertension #4 diabetes mellitus.   ROS: Denies any headaches, dizziness, double vision, fevers, chills, constipation, chest pain, heart palpitations, shortness of breath, blood in stool, black tarry stool, urinary frequency, hematuria, polyuria, polydipsia.    PHYSICAL EXAMINATION:  ECOG PERFORMANCE STATUS: 1  GENERAL: Patient is moderately built and thin individual, alert and oriented, in no acute distress. No icterus   HEENT : EOMs intact. No oral thrush or mucositis.  CVS : S1-S2, regular rate and rhythm  LUNGS : Bilaterally good air  entry, no crepitations or rhonchi  ABDOMEN : Soft, liver is enlarged, hard to palpation, mildly tender. Bowel sounds present. No guarding or rigidity. EXTREMITIES : No edema  NEURO: grossly nonfocal, cranial nerves intact.   LABS: 05/29/13 - hemoglobin 10.1, platelets 302, WBC 14300, neutrophils 91%, creatinine 1.32, glucose 690, bilirubin 2.1, ALT 53, albumin 2.8, alkaline phosphatase 217    ASSESSMENT / PLAN : metastatic adenocarcinoma pancreas to liver and lung as well as lymph nodes, complicated by obstructive jaundice requiring stent placement. Started FOLFIRINOX chemotherapy June 2014 - patient overall seems to be tolerating chemotherapy fairly well except for some side effects. She was advised to take H2-blocker OTC for heartburn issues. She was advised to try and keep blood sugar under better control and to notify if any worsening symptoms of peripheral neuropathy. She will try topical antibiotic/antifungal cream for rash in the pubic region. Continue Imodium when necessary for diarrhea. She was encouraged to continue to eat well. She has minor symptoms of grade 1 peripheral neuropathy, continue to monitor this. Otherwise she will return for scheduled treatment appointment on July 14 with labs and cycle 2 FOLFIRINOX (oxaliplatin 80 mg/m2, irinotecan 180 mg/m2,  bolus 5-FU 400 mg/m2, leucovorin 400 mg/m2, CIV 5-FU 2400 mg/m2). Have recommended getting Neulasta support given her risk of myelosuppression and febrile neutropenia (at least 20% or higher). Monitor labs q. weekly and continue to follow her once every 2 weeks to plan continued treatment. In between visits, patient was to call or come to ER in case of any worsening symptoms or acute sickness. She is agreeable to this plan.

## 2013-06-09 ENCOUNTER — Encounter (HOSPITAL_BASED_OUTPATIENT_CLINIC_OR_DEPARTMENT_OTHER): Payer: Medicare Other

## 2013-06-09 ENCOUNTER — Encounter (HOSPITAL_COMMUNITY): Payer: Self-pay | Admitting: Hematology and Oncology

## 2013-06-09 VITALS — BP 133/72 | HR 92 | Temp 99.3°F | Resp 16 | Wt 157.8 lb

## 2013-06-09 DIAGNOSIS — C7800 Secondary malignant neoplasm of unspecified lung: Secondary | ICD-10-CM

## 2013-06-09 DIAGNOSIS — C787 Secondary malignant neoplasm of liver and intrahepatic bile duct: Secondary | ICD-10-CM

## 2013-06-09 DIAGNOSIS — Z5111 Encounter for antineoplastic chemotherapy: Secondary | ICD-10-CM

## 2013-06-09 DIAGNOSIS — C259 Malignant neoplasm of pancreas, unspecified: Secondary | ICD-10-CM

## 2013-06-09 LAB — CBC WITH DIFFERENTIAL/PLATELET
Eosinophils Relative: 7 % — ABNORMAL HIGH (ref 0–5)
HCT: 26.8 % — ABNORMAL LOW (ref 36.0–46.0)
Hemoglobin: 9.2 g/dL — ABNORMAL LOW (ref 12.0–15.0)
Lymphocytes Relative: 19 % (ref 12–46)
Lymphs Abs: 1.5 10*3/uL (ref 0.7–4.0)
MCV: 88.2 fL (ref 78.0–100.0)
Monocytes Absolute: 1.2 10*3/uL — ABNORMAL HIGH (ref 0.1–1.0)
Monocytes Relative: 14 % — ABNORMAL HIGH (ref 3–12)
Neutro Abs: 4.8 10*3/uL (ref 1.7–7.7)
RBC: 3.04 MIL/uL — ABNORMAL LOW (ref 3.87–5.11)
RDW: 15.2 % (ref 11.5–15.5)
WBC: 8 10*3/uL (ref 4.0–10.5)

## 2013-06-09 LAB — COMPREHENSIVE METABOLIC PANEL
Albumin: 2.2 g/dL — ABNORMAL LOW (ref 3.5–5.2)
Alkaline Phosphatase: 156 U/L — ABNORMAL HIGH (ref 39–117)
BUN: 13 mg/dL (ref 6–23)
CO2: 28 mEq/L (ref 19–32)
Chloride: 102 mEq/L (ref 96–112)
Creatinine, Ser: 0.97 mg/dL (ref 0.50–1.10)
GFR calc non Af Amer: 60 mL/min — ABNORMAL LOW (ref >90)
Potassium: 4.1 mEq/L (ref 3.5–5.1)
Total Bilirubin: 1.1 mg/dL (ref 0.3–1.2)

## 2013-06-09 MED ORDER — SODIUM CHLORIDE 0.9 % IV SOLN: 16.0000 mg | Freq: Once | INTRAVENOUS | Status: DC

## 2013-06-09 MED ORDER — OXALIPLATIN CHEMO INJECTION 100 MG/20ML
85.0000 mg/m2 | Freq: Once | INTRAVENOUS | Status: AC
  Administered 2013-06-09: 150 mg via INTRAVENOUS
  Filled 2013-06-09: qty 30

## 2013-06-09 MED ORDER — HEPARIN SOD (PORK) LOCK FLUSH 100 UNIT/ML IV SOLN
500.0000 [IU] | Freq: Once | INTRAVENOUS | Status: DC | PRN
  Filled 2013-06-09: qty 5

## 2013-06-09 MED ORDER — DEXAMETHASONE SODIUM PHOSPHATE 10 MG/ML IJ SOLN: 20.0000 mg | Freq: Once | INTRAMUSCULAR | Status: DC

## 2013-06-09 MED ORDER — IRINOTECAN HCL CHEMO INJECTION 100 MG/5ML
180.0000 mg/m2 | Freq: Once | INTRAVENOUS | Status: AC
  Administered 2013-06-09: 314 mg via INTRAVENOUS
  Filled 2013-06-09: qty 15.7

## 2013-06-09 MED ORDER — SODIUM CHLORIDE 0.9 % IV SOLN
Freq: Once | INTRAVENOUS | Status: AC
  Administered 2013-06-09: 16 mg via INTRAVENOUS
  Filled 2013-06-09: qty 8

## 2013-06-09 MED ORDER — LEUCOVORIN CALCIUM INJECTION 350 MG: 400.0000 mg/m2 | Freq: Once | INTRAVENOUS | Status: DC

## 2013-06-09 MED ORDER — FLUOROURACIL CHEMO INJECTION 2.5 GM/50ML
400.0000 mg/m2 | Freq: Once | INTRAVENOUS | Status: AC
  Administered 2013-06-09: 700 mg via INTRAVENOUS
  Filled 2013-06-09: qty 14

## 2013-06-09 MED ORDER — SODIUM CHLORIDE 0.9 % IV SOLN
2400.0000 mg/m2 | INTRAVENOUS | Status: DC
  Administered 2013-06-09: 4200 mg via INTRAVENOUS
  Filled 2013-06-09: qty 84

## 2013-06-09 MED ORDER — LEUCOVORIN CALCIUM INJECTION 100 MG
20.0000 mg/m2 | Freq: Once | INTRAMUSCULAR | Status: AC
  Administered 2013-06-09: 34 mg via INTRAVENOUS
  Filled 2013-06-09: qty 1.7

## 2013-06-09 MED ORDER — SODIUM CHLORIDE 0.9 % IJ SOLN
10.0000 mL | INTRAMUSCULAR | Status: DC | PRN
  Administered 2013-06-09: 10 mL
  Filled 2013-06-09: qty 10

## 2013-06-09 NOTE — Progress Notes (Signed)
Tolerated chemo well. 

## 2013-06-10 ENCOUNTER — Telehealth (HOSPITAL_COMMUNITY): Payer: Self-pay | Admitting: *Deleted

## 2013-06-10 NOTE — Telephone Encounter (Signed)
Patient called to let me know what her blood sugars are running. Blood sugar last pm was 556 @ 2118. @ 2332 it was 413. This am @ 0353 it was 359. At 1137 it was 411. Patient took Lantus 34 units last pm and is going to take 40 units tonight based off of Dr. Michelle Nasuti last set of instructions. Had BM yday. No stomach cramps. Pt states that she is feeling good.

## 2013-06-10 NOTE — Addendum Note (Signed)
Addended by: Oda Kilts on: 06/10/2013 03:22 PM   Modules accepted: Orders

## 2013-06-11 ENCOUNTER — Encounter (HOSPITAL_BASED_OUTPATIENT_CLINIC_OR_DEPARTMENT_OTHER): Payer: Medicare Other

## 2013-06-11 VITALS — BP 136/76 | HR 81 | Temp 98.2°F | Resp 18

## 2013-06-11 DIAGNOSIS — Z5111 Encounter for antineoplastic chemotherapy: Secondary | ICD-10-CM

## 2013-06-11 DIAGNOSIS — C259 Malignant neoplasm of pancreas, unspecified: Secondary | ICD-10-CM

## 2013-06-11 DIAGNOSIS — C7800 Secondary malignant neoplasm of unspecified lung: Secondary | ICD-10-CM

## 2013-06-11 DIAGNOSIS — C787 Secondary malignant neoplasm of liver and intrahepatic bile duct: Secondary | ICD-10-CM

## 2013-06-11 MED ORDER — PEGFILGRASTIM INJECTION 6 MG/0.6ML
SUBCUTANEOUS | Status: AC
  Filled 2013-06-11: qty 0.6

## 2013-06-11 MED ORDER — HEPARIN SOD (PORK) LOCK FLUSH 100 UNIT/ML IV SOLN
500.0000 [IU] | Freq: Once | INTRAVENOUS | Status: AC | PRN
  Administered 2013-06-11: 500 [IU]
  Filled 2013-06-11: qty 5

## 2013-06-11 MED ORDER — PEGFILGRASTIM INJECTION 6 MG/0.6ML
6.0000 mg | Freq: Once | SUBCUTANEOUS | Status: AC
  Administered 2013-06-11: 6 mg via SUBCUTANEOUS

## 2013-06-11 MED ORDER — SODIUM CHLORIDE 0.9 % IJ SOLN
10.0000 mL | INTRAMUSCULAR | Status: DC | PRN
  Administered 2013-06-11: 10 mL
  Filled 2013-06-11: qty 10

## 2013-06-11 NOTE — Progress Notes (Signed)
Tolerated chemo well.  [name] presented for Portacath de-access and flush.  Proper placement of portacath confirmed by CXR.  Portacath flushed with 20ml NS and 500U/9ml Heparin and needle removed intact.  Procedure tolerated well and without incident.

## 2013-06-11 NOTE — Patient Instructions (Signed)
Miralax x 1 capful this afternoon and again this evening; continue with a capful of miralax daily until you have a bowel movement.  Stop with the first signs of loose stool.

## 2013-06-12 ENCOUNTER — Telehealth (HOSPITAL_COMMUNITY): Payer: Self-pay | Admitting: *Deleted

## 2013-06-12 NOTE — Telephone Encounter (Signed)
Patient called to let me know that her bowels still haven't moved. She has taken 2 capfuls of Miralax this am and later 8 Tablespoonfuls of MOM. NO BM. Tom recommended her drinking 1/2 bottle of Mag Citrate and if no results then drinking the other 1/2 bottle in the am. Patient to call me with a report.   AT 1350 blood sugar was 147.

## 2013-06-13 ENCOUNTER — Telehealth (HOSPITAL_COMMUNITY): Payer: Self-pay | Admitting: *Deleted

## 2013-06-13 ENCOUNTER — Other Ambulatory Visit (HOSPITAL_COMMUNITY): Payer: Self-pay | Admitting: Oncology

## 2013-06-13 ENCOUNTER — Encounter (HOSPITAL_COMMUNITY): Payer: Self-pay | Admitting: *Deleted

## 2013-06-13 DIAGNOSIS — K219 Gastro-esophageal reflux disease without esophagitis: Secondary | ICD-10-CM

## 2013-06-13 MED ORDER — OMEPRAZOLE 20 MG PO CPDR: 20.0000 mg | DELAYED_RELEASE_CAPSULE | Freq: Every day | ORAL | Status: DC

## 2013-06-13 NOTE — Telephone Encounter (Signed)
Patient had a BM 06/12/13 pm.

## 2013-06-23 ENCOUNTER — Encounter (HOSPITAL_COMMUNITY): Payer: Self-pay

## 2013-06-23 ENCOUNTER — Encounter (HOSPITAL_BASED_OUTPATIENT_CLINIC_OR_DEPARTMENT_OTHER): Payer: Medicare Other

## 2013-06-23 VITALS — BP 163/83 | HR 78 | Temp 98.7°F | Resp 18 | Wt 157.8 lb

## 2013-06-23 DIAGNOSIS — C7800 Secondary malignant neoplasm of unspecified lung: Secondary | ICD-10-CM

## 2013-06-23 DIAGNOSIS — E162 Hypoglycemia, unspecified: Secondary | ICD-10-CM

## 2013-06-23 DIAGNOSIS — K5900 Constipation, unspecified: Secondary | ICD-10-CM

## 2013-06-23 DIAGNOSIS — C787 Secondary malignant neoplasm of liver and intrahepatic bile duct: Secondary | ICD-10-CM

## 2013-06-23 DIAGNOSIS — Z5111 Encounter for antineoplastic chemotherapy: Secondary | ICD-10-CM

## 2013-06-23 DIAGNOSIS — C259 Malignant neoplasm of pancreas, unspecified: Secondary | ICD-10-CM

## 2013-06-23 LAB — COMPREHENSIVE METABOLIC PANEL
ALT: 17 U/L (ref 0–35)
AST: 27 U/L (ref 0–37)
Albumin: 2.2 g/dL — ABNORMAL LOW (ref 3.5–5.2)
Calcium: 9 mg/dL (ref 8.4–10.5)
Creatinine, Ser: 1.57 mg/dL — ABNORMAL HIGH (ref 0.50–1.10)
Sodium: 141 mEq/L (ref 135–145)

## 2013-06-23 LAB — CBC WITH DIFFERENTIAL/PLATELET
Basophils Absolute: 0 10*3/uL (ref 0.0–0.1)
Basophils Relative: 0 % (ref 0–1)
Eosinophils Relative: 3 % (ref 0–5)
HCT: 26.8 % — ABNORMAL LOW (ref 36.0–46.0)
Lymphocytes Relative: 15 % (ref 12–46)
MCHC: 34.7 g/dL (ref 30.0–36.0)
MCV: 87 fL (ref 78.0–100.0)
Monocytes Absolute: 1.5 10*3/uL — ABNORMAL HIGH (ref 0.1–1.0)
Neutro Abs: 13.7 10*3/uL — ABNORMAL HIGH (ref 1.7–7.7)
Platelets: 224 10*3/uL (ref 150–400)
RDW: 15.4 % (ref 11.5–15.5)
WBC: 18.5 10*3/uL — ABNORMAL HIGH (ref 4.0–10.5)

## 2013-06-23 LAB — CANCER ANTIGEN 19-9: CA 19-9: >140000 U/mL — ABNORMAL HIGH (ref <35.0)

## 2013-06-23 MED ORDER — LEUCOVORIN CALCIUM INJECTION 350 MG: 400.0000 mg/m2 | Freq: Once | INTRAVENOUS | Status: DC

## 2013-06-23 MED ORDER — DEXTROSE 5 % IV SOLN
Freq: Once | INTRAVENOUS | Status: AC
  Administered 2013-06-23: 11:00:00 via INTRAVENOUS

## 2013-06-23 MED ORDER — HEPARIN SOD (PORK) LOCK FLUSH 100 UNIT/ML IV SOLN
500.0000 [IU] | Freq: Once | INTRAVENOUS | Status: DC | PRN
  Filled 2013-06-23: qty 5

## 2013-06-23 MED ORDER — DEXAMETHASONE SODIUM PHOSPHATE 10 MG/ML IJ SOLN: 20.0000 mg | Freq: Once | INTRAMUSCULAR | Status: DC

## 2013-06-23 MED ORDER — SODIUM CHLORIDE 0.9 % IV SOLN
Freq: Once | INTRAVENOUS | Status: AC
  Administered 2013-06-23: 16 mg via INTRAVENOUS
  Filled 2013-06-23: qty 8

## 2013-06-23 MED ORDER — SODIUM CHLORIDE 0.9 % IV SOLN: 16.0000 mg | Freq: Once | INTRAVENOUS | Status: DC

## 2013-06-23 MED ORDER — LEUCOVORIN CALCIUM INJECTION 100 MG
20.0000 mg/m2 | Freq: Once | INTRAMUSCULAR | Status: AC
  Administered 2013-06-23: 34 mg via INTRAVENOUS
  Filled 2013-06-23: qty 1.7

## 2013-06-23 MED ORDER — SODIUM CHLORIDE 0.9 % IV SOLN
2400.0000 mg/m2 | INTRAVENOUS | Status: DC
  Administered 2013-06-23: 4200 mg via INTRAVENOUS
  Filled 2013-06-23: qty 84

## 2013-06-23 MED ORDER — IRINOTECAN HCL CHEMO INJECTION 100 MG/5ML
180.0000 mg/m2 | Freq: Once | INTRAVENOUS | Status: AC
  Administered 2013-06-23: 314 mg via INTRAVENOUS
  Filled 2013-06-23: qty 15.7

## 2013-06-23 MED ORDER — SODIUM CHLORIDE 0.9 % IV SOLN
Freq: Once | INTRAVENOUS | Status: AC
  Administered 2013-06-23: 09:00:00 via INTRAVENOUS

## 2013-06-23 MED ORDER — OXALIPLATIN CHEMO INJECTION 100 MG/20ML
85.0000 mg/m2 | Freq: Once | INTRAVENOUS | Status: AC
  Administered 2013-06-23: 150 mg via INTRAVENOUS
  Filled 2013-06-23: qty 30

## 2013-06-23 MED ORDER — SODIUM CHLORIDE 0.9 % IJ SOLN
10.0000 mL | INTRAMUSCULAR | Status: DC | PRN
  Filled 2013-06-23: qty 10

## 2013-06-23 MED ORDER — FLUOROURACIL CHEMO INJECTION 2.5 GM/50ML
400.0000 mg/m2 | Freq: Once | INTRAVENOUS | Status: AC
  Administered 2013-06-23: 700 mg via INTRAVENOUS
  Filled 2013-06-23: qty 14

## 2013-06-23 NOTE — Patient Instructions (Addendum)
.  [facility] Discharge Instructions  RECOMMENDATIONS MADE BY THE CONSULTANT AND ANY TEST RESULTS WILL BE SENT TO YOUR REFERRING PHYSICIAN.  EXAM FINDINGS BY THE PHYSICIAN TODAY AND SIGNS OR SYMPTOMS TO REPORT TO CLINIC OR PRIMARY PHYSICIAN: Exam today per Dr. Beckie Busing   INSTRUCTIONS GIVEN AND DISCUSSED: Dr. Beckie Busing spoke with Dr. [name] and you are to go by his office when you leave today. He will help Korea in blood sugar control and adjustment of insulin  SPECIAL INSTRUCTIONS/FOLLOW-UP: Chemo as scheduled and return on Wednesday for pump removal.   Thank you for choosing [facility] to provide your oncology and hematology care.  To afford each patient quality time with our providers, please arrive at least 15 minutes before your scheduled appointment time.  With your help, our goal is to use those 15 minutes to complete the necessary work-up to ensure our physicians have the information they need to help with your evaluation and healthcare recommendations.    Effective January 1st, 2014, we ask that you re-schedule your appointment with our physicians should you arrive 10 or more minutes late for your appointment.  We strive to give you quality time with our providers, and arriving late affects you and other patients whose appointments are after yours.    Again, thank you for choosing [facility].  Our hope is that these requests will decrease the amount of time that you wait before being seen by our physicians.       _____________________________________________________________  Should you have questions after your visit to [facility], please contact our office at [phone] between the hours of 8:30 a.m. and 5:00 p.m.  Voicemails left after 4:30 p.m. will not be returned until the following business day.  For prescription refill requests, have your pharmacy contact our office with your prescription refill request.

## 2013-06-23 NOTE — Progress Notes (Signed)
MD exam today and tolerated chemo well.

## 2013-06-23 NOTE — Progress Notes (Signed)
[facility] Telephone:([phone]) [phone]   [phone]  OFFICE PROGRESS NOTE  Autumn Obey, MD [address] [place] [address]  DIAGNOSIS: Metastatic pancreatic cancer with metastasis to the liver.   INTERVAL HISTORY:  : [name] is a 67 year old woman whom I previously saw in the hospital or consultation on 05/08/2013, at that time patient did not have a histologic diagnosis. She had been admitted for abdominal pain and jaundice. Patient later had a needle core biopsy of the left lobe the liver on 05/12/2013 which showed adenocarcinoma believed to be compatible with a pancreatic adenocarcinoma. Decision was then made to treat her with FOLFIRINOX chemotherapy which was started on 05/26/2013. Patient has just finished one cycle.  She has tolerated this very well and overall feels well.  She said she had diarrhea initially after chemo but only needed to  take Imodium once. Since then has had mostly constipation. She denies any fever or chills.  She states that she is eating well, denies shortness of breath or pain . She did tell me that she was recently instructed by her Dr [name] to take for the 45 units of insulin on the days she is on chemotherapy and 35 units subsequently on non chemo days. She states that on her chemotherapy days that she feels well , but  On non chemotherapy days that she feels faint sometimes and her blood sugar has been in the 40s on some of these occasions.      MEDICAL HISTORY: Past Medical History  Diagnosis Date  . Diabetes mellitus without complication   . Hypertension   . Anxiety   . Arthritis   . Pancreatic cancer     ALLERGIES:  has No Known Allergies.  MEDICATIONS:  Current Outpatient Prescriptions  Medication Sig Dispense Refill  . ALPRAZolam (XANAX) 1 MG tablet Take 1 mg by mouth 3 (three) times daily as needed for sleep or anxiety.      Marland Kitchen amLODipine (NORVASC) 5 MG tablet Take 5 mg by mouth  daily.      Marland Kitchen dexamethasone (DECADRON) 4 MG tablet Starting the day after chemo, take 1 tab in the am and 1 tab in the pm for 3 days. Take with food.      . insulin glargine (LANTUS) 100 UNIT/ML injection Inject 34 Units into the skin at bedtime.       . lidocaine-prilocaine (EMLA) cream Apply topically as needed. Apply a quarter size amount to port site 1 hour prior to chemo. Do not rub in. Cover with plastic.  30 g  3  . loperamide (IMODIUM A-D) 2 MG tablet Take 2 tab at onset of diarrhea, then 1 tab q2h until 12 hr have passed without a BM.  May take 2 tab q4h qhs. If diarrhea recurs repeat.  30 tablet  3  . LORazepam (ATIVAN) 0.5 MG tablet Take 0.5 mg by mouth. May take 1 -2 tabs every 6 hours IF needed for nausea/vomiting. Start with 1 tab and increase to 2 tabs if needed. (Do not take Xanax within 2 hours of taking Ativan).      Marland Kitchen losartan (COZAAR) 100 MG tablet Take 100 mg by mouth daily.      Marland Kitchen omeprazole (PRILOSEC) 20 MG capsule Take 1 capsule (20 mg total) by mouth daily.  30 capsule  1  . ondansetron (ZOFRAN) 8 MG tablet The day after chemo, take 1 tab in am x 2 days. Then may  take 1 tab two times a day IF needed for nausea/vomiting.      . polyethylene glycol (MIRALAX / GLYCOLAX) packet Take 17 g by mouth daily. 17-34 g daily or 1-2 capfuls daily      . potassium chloride SA (K-DUR,KLOR-CON) 20 MEQ tablet Take 20 mEq by mouth daily.       No current facility-administered medications for this visit.   Facility-Administered Medications Ordered in Other Visits  Medication Dose Route Frequency Provider Last Rate Last Dose  . fluorouracil (ADRUCIL) 4,200 mg in sodium chloride 0.9 % 150 mL chemo infusion  2,400 mg/m2 (Treatment Plan Actual) Intravenous 1 day or 1 dose Sherral Hammers, MD   4,200 mg at 06/23/13 1525  . heparin lock flush 100 unit/mL  500 Units Intracatheter Once PRN Sherral Hammers, MD      . sodium chloride 0.9 % injection 10 mL  10 mL Intracatheter PRN Sherral Hammers, MD         SURGICAL HISTORY:  Past Surgical History  Procedure Laterality Date  . Abdominal hysterectomy    . Colonoscopy  2006    Dr. Jena Gauss, polyp, benign  . Ercp  05/10/2013    Procedure: ENDOSCOPIC RETROGRADE CHOLANGIOPANCREATOGRAPHY (ERCP)(DIFFICULT CANNULATION) ;  Surgeon: Malissa Hippo, MD;  Location: [facility];  Service: Endoscopy;;  . Biliary stent placement  05/10/2013    Procedure: BILIARY STENT PLACEMENT ( X WALLFLEX STENT);  Surgeon: Malissa Hippo, MD;  Location: [facility];  Service: Endoscopy;;  . Sphincterotomy  05/10/2013    Procedure: SPHINCTEROTOMY (PRECUT MADE WITH NEEDLE KNIFE);  Surgeon: Malissa Hippo, MD;  Location: [facility];  Service: Endoscopy;;  . Liver biopsy  05/12/2013  . Portacath placement Left 05/23/2013    Procedure: INSERTION PORT-A-CATH;  Surgeon: Fabio Bering, MD;  Location: [facility];  Service: General;  Laterality: Left;     REVIEW OF SYSTEMS: 14 point review of system is as in the history above otherwise negative.    PHYSICAL EXAMINATION:  There were no vitals taken for this visit. GENERAL: No distress. SKIN:  No rashes or significant lesions  HEAD: Normocephalic, No masses, lesions, tenderness or abnormalities  EYES: Conjunctiva are pink and non-injected and no significant juandice. ENT: External ears normal ,lips, buccal mucosa, and tongue normal and mucous membranes are moist  LYMPH: No palpable lymphadenopathy, in the neck or supraclavicular areas. LUNGS: clear to auscultation , no crackles or wheezes HEART: regular rate & rhythm, no murmurs, no gallops, S1 normal and S2 normal  ABDOMEN: Abdomen soft, non-tender,no definite organomegaly and no hepatosplenomegaly palpable EXTREMITIES: No edema, no skin discoloration or tenderness NEURO: alert & oriented , no focal motor deficits.     LABORATORY DATA: Lab Results  Component Value Date   WBC 18.5* 06/23/2013   HGB 9.3* 06/23/2013   HCT 26.8* 06/23/2013   MCV 87.0 06/23/2013   PLT 224  06/23/2013      Chemistry      Component Value Date/Time   NA 141 06/23/2013 0913   K 3.3* 06/23/2013 0913   CL 107 06/23/2013 0913   CO2 26 06/23/2013 0913   BUN 18 06/23/2013 0913   CREATININE 1.57* 06/23/2013 0913      Component Value Date/Time   CALCIUM 9.0 06/23/2013 0913   ALKPHOS 172* 06/23/2013 0913   AST 27 06/23/2013 0913   ALT 17 06/23/2013 0913   BILITOT 0.6 06/23/2013 0913       RADIOGRAPHIC STUDIES: No results found.  ASSESSMENT:  [name] is tolerating palliative intent chemotherapy with FOLFIRINOX.  This has a median survival of about 11 months based on [employer] Trial. Unarguably this is the best front line treatment  for metastatic pancreatic cancer. Etiology of the constipation and is unclear but could be related to diet or constitution. I am concerned about what  appears like hypoglycemic episodes and I feel that downward adjustments in her insulin regimen would be reasonable.  PLAN:  1. Continue for chemotherapy as prescribed. 2. She'll return to clinic in 2 weeks for another cycle. 3. I called Dr. [name] and had a discussion with him regarding adjusting patient is regimen given her 'faint' episodes and  low blood sugar.  He instructed me to tell patient to come by his office today after chemotherapy.  I did relay this to the patient and they also instructed our RN [name] to put this in her discharge instructions. 4.For her constipation I advised her to use MiraLax when necessary.    All questions were satisfactorily answered. Patient knows to call if  any concern arises.  I spent more than 50 % counseling the patient face to face. The total time spent in the appointment was 30 minutes.   Sherral Hammers, MD FACP. Hematology/Oncology.

## 2013-06-25 ENCOUNTER — Encounter (HOSPITAL_BASED_OUTPATIENT_CLINIC_OR_DEPARTMENT_OTHER): Payer: Medicare Other

## 2013-06-25 ENCOUNTER — Other Ambulatory Visit (HOSPITAL_COMMUNITY): Payer: Self-pay | Admitting: Oncology

## 2013-06-25 VITALS — BP 156/83 | HR 69 | Temp 98.3°F | Resp 16

## 2013-06-25 DIAGNOSIS — E876 Hypokalemia: Secondary | ICD-10-CM

## 2013-06-25 DIAGNOSIS — C259 Malignant neoplasm of pancreas, unspecified: Secondary | ICD-10-CM

## 2013-06-25 DIAGNOSIS — Z5189 Encounter for other specified aftercare: Secondary | ICD-10-CM

## 2013-06-25 MED ORDER — POTASSIUM CHLORIDE CRYS ER 20 MEQ PO TBCR: 20.0000 meq | EXTENDED_RELEASE_TABLET | Freq: Two times a day (BID) | ORAL | Status: DC

## 2013-06-25 MED ORDER — PEGFILGRASTIM INJECTION 6 MG/0.6ML
SUBCUTANEOUS | Status: AC
  Filled 2013-06-25: qty 0.6

## 2013-06-25 MED ORDER — HEPARIN SOD (PORK) LOCK FLUSH 100 UNIT/ML IV SOLN
INTRAVENOUS | Status: AC
  Filled 2013-06-25: qty 5

## 2013-06-25 MED ORDER — SODIUM CHLORIDE 0.9 % IJ SOLN
10.0000 mL | INTRAMUSCULAR | Status: DC | PRN
  Administered 2013-06-25: 10 mL
  Filled 2013-06-25: qty 10

## 2013-06-25 MED ORDER — HEPARIN SOD (PORK) LOCK FLUSH 100 UNIT/ML IV SOLN
500.0000 [IU] | Freq: Once | INTRAVENOUS | Status: AC | PRN
  Administered 2013-06-25: 500 [IU]
  Filled 2013-06-25: qty 5

## 2013-06-25 MED ORDER — PEGFILGRASTIM INJECTION 6 MG/0.6ML
6.0000 mg | Freq: Once | SUBCUTANEOUS | Status: AC
  Administered 2013-06-25: 6 mg via SUBCUTANEOUS

## 2013-06-25 NOTE — Telephone Encounter (Signed)
I don't know why this needs additional notes before I close it.  

## 2013-06-25 NOTE — Progress Notes (Signed)
[name] presented for Portacath deaccess and flush after completion of 46 hr continous infusion 17fu. Portacath located lt chest wall accessed with  H 20 needle. Good blood return present. Portacath flushed with 20ml NS and 500U/50ml Heparin and needle removed intact. Procedure without incident. Patient tolerated procedure well. [name] presents today for injection per MD orders. Neulasta 6mg  administered SQ in right Abdomen. Administration without incident. Patient tolerated well. No problems post chemo. She reports good bowel movement today. Some swelling noted in lower extremities. Lt worse than rt. No redness, warmth or pain. Patient notified to let us know if it gets any worse or these symptoms appear.

## 2013-07-04 ENCOUNTER — Encounter (HOSPITAL_COMMUNITY): Payer: Medicare Other | Attending: Oncology | Admitting: Oncology

## 2013-07-04 ENCOUNTER — Encounter (HOSPITAL_COMMUNITY): Payer: Medicare Other

## 2013-07-04 VITALS — BP 138/80 | HR 105 | Temp 98.4°F | Resp 20 | Wt 154.5 lb

## 2013-07-04 DIAGNOSIS — C259 Malignant neoplasm of pancreas, unspecified: Secondary | ICD-10-CM

## 2013-07-04 DIAGNOSIS — C787 Secondary malignant neoplasm of liver and intrahepatic bile duct: Secondary | ICD-10-CM

## 2013-07-04 LAB — CBC WITH DIFFERENTIAL/PLATELET
Basophils Relative: 1 % (ref 0–1)
Eosinophils Relative: 1 % (ref 0–5)
HCT: 28.9 % — ABNORMAL LOW (ref 36.0–46.0)
Hemoglobin: 9.7 g/dL — ABNORMAL LOW (ref 12.0–15.0)
Lymphocytes Relative: 19 % (ref 12–46)
Lymphs Abs: 3.2 10*3/uL (ref 0.7–4.0)
MCV: 88.7 fL (ref 78.0–100.0)
Monocytes Relative: 12 % (ref 3–12)
Neutro Abs: 11.3 10*3/uL — ABNORMAL HIGH (ref 1.7–7.7)
RDW: 16.7 % — ABNORMAL HIGH (ref 11.5–15.5)
WBC: 16.9 10*3/uL — ABNORMAL HIGH (ref 4.0–10.5)

## 2013-07-04 LAB — COMPREHENSIVE METABOLIC PANEL
ALT: 22 U/L (ref 0–35)
AST: 22 U/L (ref 0–37)
Alkaline Phosphatase: 202 U/L — ABNORMAL HIGH (ref 39–117)
CO2: 25 mEq/L (ref 19–32)
Calcium: 9 mg/dL (ref 8.4–10.5)
Chloride: 101 mEq/L (ref 96–112)
GFR calc Af Amer: 47 mL/min — ABNORMAL LOW (ref >90)
GFR calc non Af Amer: 40 mL/min — ABNORMAL LOW (ref >90)
Glucose, Bld: 114 mg/dL — ABNORMAL HIGH (ref 70–99)
Potassium: 3.9 mEq/L (ref 3.5–5.1)
Sodium: 135 mEq/L (ref 135–145)
Total Bilirubin: 0.5 mg/dL (ref 0.3–1.2)

## 2013-07-04 NOTE — Progress Notes (Signed)
[name], MD [address] [place] [address]  Malignant neoplasm of pancreas, part unspecified - Plan: CT Chest W Contrast, CT Abdomen Pelvis W Contrast  CURRENT THERAPY: S/P 3 treatments of FOLFIRINOX beginning on 05/26/2013  INTERVAL HISTORY: KERSTIN CRUSOE 67 y.o. female returns for  regular  visit for followup of Metastatic pancreatic cancer with metastasis to the liver.  According to Dr. Sharia Reeve: "Ms. Adelsberger is a 67 year old woman who was previously seen in the hospital or consultation on 05/08/2013, at that time patient did not have a histologic diagnosis. She had been admitted for abdominal pain and jaundice. Patient later had a needle core biopsy of the left lobe the liver on 05/12/2013 which showed adenocarcinoma believed to be compatible with a pancreatic adenocarcinoma. Decision was then made to treat her with FOLFIRINOX chemotherapy which was started on 05/26/2013. "  Rilea is tolerating chemotherapy very well.  She denies any nausea or vomiting.  She denies any side effects associated with chemotherapy.   She is noted to have 1+ non-pitting edema in ankle B/L and she denies any complaints associated with that.    Her only complaint is mild abdominal cramping/bloating.  She admits that her bowels are moving without any difficulties.  She denies any blood in stool or black tarry stool.  She is on a bowel regimen consisting of MiraLax which is effective for her.   She may have minimal abdominal distension, but this is unimpressive on exam.  She admits that her appetite is strong and she denies any early satiety.  She does have an abdominal hernia that is small, easily reducible and is non tender.   She is tolerating therapy extremely well so we will get pre-chemo labs today in preparation for chemotherapy on Monday.  Following her 6th treatment, we will perform restaging CT scans of CAP with contrast.    Oncologically, she denies any complaints and  ROS questioning is negative.    Past Medical History  Diagnosis Date  . Diabetes mellitus without complication   . Hypertension   . Anxiety   . Arthritis   . Pancreatic cancer     has Pancreatic mass; Obstructive jaundice; Hypokalemia; Dehydration; Tobacco abuse; HTN (hypertension); Diabetes mellitus; Malignant neoplasm of pancreas, part unspecified; and Diabetes type 2, uncontrolled on her problem list.     has No Known Allergies.  Ms. Cliff does not currently have medications on file.  Past Surgical History  Procedure Laterality Date  . Abdominal hysterectomy    . Colonoscopy  2006    Dr. Jena Gauss, polyp, benign  . Ercp  05/10/2013    Procedure: ENDOSCOPIC RETROGRADE CHOLANGIOPANCREATOGRAPHY (ERCP)(DIFFICULT CANNULATION) ;  Surgeon: Malissa Hippo, MD;  Location: [facility];  Service: Endoscopy;;  . Biliary stent placement  05/10/2013    Procedure: BILIARY STENT PLACEMENT ( X WALLFLEX STENT);  Surgeon: Malissa Hippo, MD;  Location: [facility];  Service: Endoscopy;;  . Sphincterotomy  05/10/2013    Procedure: SPHINCTEROTOMY (PRECUT MADE WITH NEEDLE KNIFE);  Surgeon: Malissa Hippo, MD;  Location: [facility];  Service: Endoscopy;;  . Liver biopsy  05/12/2013  . Portacath placement Left 05/23/2013    Procedure: INSERTION PORT-A-CATH;  Surgeon: Fabio Bering, MD;  Location: [facility];  Service: General;  Laterality: Left;    Denies any headaches, dizziness, double vision, fevers, chills, night sweats, nausea, vomiting, diarrhea, constipation, chest pain, heart palpitations, shortness of breath, blood in stool, black tarry stool, urinary  pain, urinary burning, urinary frequency, hematuria.   PHYSICAL EXAMINATION  ECOG PERFORMANCE STATUS: 1 - Symptomatic but completely ambulatory  Filed Vitals:   07/04/13 1157  BP: 138/80  Pulse: 105  Temp: 98.4 F (36.9 C)  Resp: 20    GENERAL:alert, no distress, well nourished, well developed, comfortable, cooperative and smiling SKIN: skin  color, texture, turgor are normal, no rashes or significant lesions HEAD: Normocephalic, No masses, lesions, tenderness or abnormalities EYES: PERRLA, EOMI, minimal scersl icterus that is clearing. EARS: External ears normal OROPHARYNX:lips, buccal mucosa, and tongue normal and mucous membranes are moist  NECK: supple, no adenopathy, thyroid normal size, non-tender, without nodularity, no stridor, non-tender, trachea midline LYMPH:  no palpable lymphadenopathy, no hepatosplenomegaly BREAST:not examined LUNGS: clear to auscultation and percussion HEART: regular rate & rhythm, no murmurs, no gallops, S1 normal, S2 normal and apical HR of 107 ABDOMEN:abdomen soft, non-tender, normal bowel sounds, no masses or organomegaly, and no hepatosplenomegaly.  Possible small amount of distension with ascites, but unimpressive on exam.  Small midline abdominal defect causing small hernia superior to umbilicus.  Easily reducible. Mild tenderness to palpation. BACK: Back symmetric, no curvature. EXTREMITIES:less then 2 second capillary refill, no joint deformities, effusion, or inflammation, no skin discoloration, no clubbing, no cyanosis, positive findings:  edema B/L 1+ nonpitting edema in ankles.  NEURO: alert & oriented x 3 with fluent speech, no focal motor/sensory deficits, gait normal   LABORATORY DATA: CBC    Component Value Date/Time   WBC 18.5* 06/23/2013 0913   RBC 3.08* 06/23/2013 0913   HGB 9.3* 06/23/2013 0913   HCT 26.8* 06/23/2013 0913   PLT 224 06/23/2013 0913   MCV 87.0 06/23/2013 0913   MCH 30.2 06/23/2013 0913   MCHC 34.7 06/23/2013 0913   RDW 15.4 06/23/2013 0913   LYMPHSABS 2.7 06/23/2013 0913   MONOABS 1.5* 06/23/2013 0913   EOSABS 0.5 06/23/2013 0913   BASOSABS 0.0 06/23/2013 0913      Chemistry      Component Value Date/Time   NA 141 06/23/2013 0913   K 3.3* 06/23/2013 0913   CL 107 06/23/2013 0913   CO2 26 06/23/2013 0913   BUN 18 06/23/2013 0913   CREATININE 1.57* 06/23/2013 0913        Component Value Date/Time   CALCIUM 9.0 06/23/2013 0913   ALKPHOS 172* 06/23/2013 0913   AST 27 06/23/2013 0913   ALT 17 06/23/2013 0913   BILITOT 0.6 06/23/2013 0913      Results for KIRBI, FARRUGIA (MRN [id number]) as of 07/04/2013 12:18  Ref. Range 06/23/2013 09:13  CA 19-9 Latest Range: <35.0 U/mL >140000.0 (H)      PATHOLOGY:  05/12/2013  Diagnosis Liver, needle/core biopsy, left lobe - ADENOCARCINOMA Microscopic Comment The core biopsies are involved by adenocarcinoma consistent with metastatic adenocarcinoma. The morphologic features are compatible with metastatic pancreatic adenocarcinoma. (JDP:gt, 05/13/13) Jimmy Picket MD Pathologist, Electronic Signature (Case signed 05/13/2013)    ASSESSMENT:  1. Metastatic pancreatic cancer with metastasis to the liver.  S/P 3 treatments of FOLFIRINOX beginning on 05/26/2013. 2. Minimal abdominal distension, asymptomatic.  Will monitor.  If increases or become symptomatic, will order [place] with paracentesis if indicated.  3. B/L Nonpitting ankle edema. 4. Small, midline, abdominal hernia, asymptomatic.  Patient Active Problem List   Diagnosis Date Noted  . Diabetes type 2, uncontrolled 05/22/2013  . Malignant neoplasm of pancreas, part unspecified 05/21/2013  . Pancreatic mass 05/07/2013  . Obstructive jaundice 05/07/2013  . Hypokalemia 05/07/2013  .  Dehydration 05/07/2013  . Tobacco abuse 05/07/2013  . HTN (hypertension) 05/07/2013  . Diabetes mellitus 05/07/2013     PLAN:  1. I personally reviewed and went over laboratory results with the patient. 2. Labs today (pre-chemo):  CBC diff, CMET 3. Cycle 4 of chemotherapy as scheduled on Monday 4. Will monitor abdomen for increased distension or symptoms. 5. Restaging CT scans of CAP with contrast after 6th treatment. 6. Return in 4 weeks for follow-up.   THERAPY PLAN:  She is tolerating FOLFIRINOX very well.  We will move on with chemotherapy as scheduled.  We will  restage her with CT CAP with contrast following 6th treatment.  Her spirits are high.  All questions were answered. The patient knows to call the clinic with any problems, questions or concerns. We can certainly see the patient much sooner if necessary.  Patient and plan discussed with Dr. Erline Hau and he is in agreement with the aforementioned.   Klyde Banka

## 2013-07-04 NOTE — Patient Instructions (Addendum)
.  [facility] Discharge Instructions  RECOMMENDATIONS MADE BY THE CONSULTANT AND ANY TEST RESULTS WILL BE SENT TO YOUR REFERRING PHYSICIAN.  EXAM FINDINGS BY THE PHYSICIAN TODAY AND SIGNS OR SYMPTOMS TO REPORT TO CLINIC OR PRIMARY PHYSICIAN: Exam is good  Notify us of any fever, new pain, or nausea not relieved by your nausea meds INSTRUCTIONS GIVEN AND DISCUSSED: We will do restaging scans in September  SPECIAL INSTRUCTIONS/FOLLOW-UP: 1 month with Tom See the Dr. After scans  Thank you for choosing [facility] to provide your oncology and hematology care.  To afford each patient quality time with our providers, please arrive at least 15 minutes before your scheduled appointment time.  With your help, our goal is to use those 15 minutes to complete the necessary work-up to ensure our physicians have the information they need to help with your evaluation and healthcare recommendations.    Effective January 1st, 2014, we ask that you re-schedule your appointment with our physicians should you arrive 10 or more minutes late for your appointment.  We strive to give you quality time with our providers, and arriving late affects you and other patients whose appointments are after yours.    Again, thank you for choosing [facility].  Our hope is that these requests will decrease the amount of time that you wait before being seen by our physicians.       _____________________________________________________________  Should you have questions after your visit to [facility], please contact our office at [phone] between the hours of 8:30 a.m. and 5:00 p.m.  Voicemails left after 4:30 p.m. will not be returned until the following business day.  For prescription refill requests, have your pharmacy contact our office with your prescription refill request.

## 2013-07-05 LAB — CANCER ANTIGEN 19-9: CA 19-9: >140000 U/mL — ABNORMAL HIGH (ref <35.0)

## 2013-07-07 ENCOUNTER — Encounter (HOSPITAL_COMMUNITY): Payer: Self-pay | Admitting: Internal Medicine

## 2013-07-07 ENCOUNTER — Ambulatory Visit (HOSPITAL_COMMUNITY)
Admission: RE | Admit: 2013-07-07 | Discharge: 2013-07-07 | Disposition: A | Discharge status: Home / Self Care | Payer: Medicare Other | Source: Ambulatory Visit | Attending: Oncology | Admitting: Oncology

## 2013-07-07 ENCOUNTER — Encounter (HOSPITAL_BASED_OUTPATIENT_CLINIC_OR_DEPARTMENT_OTHER): Payer: Medicare Other

## 2013-07-07 ENCOUNTER — Encounter (HOSPITAL_BASED_OUTPATIENT_CLINIC_OR_DEPARTMENT_OTHER): Payer: Medicare Other | Admitting: Oncology

## 2013-07-07 VITALS — BP 145/87 | HR 108 | Temp 98.3°F | Resp 18

## 2013-07-07 DIAGNOSIS — C787 Secondary malignant neoplasm of liver and intrahepatic bile duct: Secondary | ICD-10-CM

## 2013-07-07 DIAGNOSIS — C259 Malignant neoplasm of pancreas, unspecified: Secondary | ICD-10-CM

## 2013-07-07 DIAGNOSIS — R188 Other ascites: Secondary | ICD-10-CM

## 2013-07-07 DIAGNOSIS — Z5111 Encounter for antineoplastic chemotherapy: Secondary | ICD-10-CM

## 2013-07-07 MED ORDER — IRINOTECAN HCL CHEMO INJECTION 100 MG/5ML
180.0000 mg/m2 | Freq: Once | INTRAVENOUS | Status: AC
  Administered 2013-07-07: 314 mg via INTRAVENOUS
  Filled 2013-07-07: qty 15.7

## 2013-07-07 MED ORDER — SODIUM CHLORIDE 0.9 % IV SOLN
Freq: Once | INTRAVENOUS | Status: AC
  Administered 2013-07-07: 16 mg via INTRAVENOUS
  Filled 2013-07-07: qty 8

## 2013-07-07 MED ORDER — SODIUM CHLORIDE 0.9 % IV SOLN: 16.0000 mg | Freq: Once | INTRAVENOUS | Status: DC

## 2013-07-07 MED ORDER — DEXTROSE 5 % IV SOLN
Freq: Once | INTRAVENOUS | Status: AC
  Administered 2013-07-07: 10:00:00 via INTRAVENOUS

## 2013-07-07 MED ORDER — SODIUM CHLORIDE 0.9 % IJ SOLN
10.0000 mL | INTRAMUSCULAR | Status: DC | PRN
  Filled 2013-07-07: qty 10

## 2013-07-07 MED ORDER — LEUCOVORIN CALCIUM INJECTION 100 MG
20.0000 mg/m2 | Freq: Once | INTRAMUSCULAR | Status: AC
  Administered 2013-07-07: 34 mg via INTRAVENOUS
  Filled 2013-07-07: qty 1.7

## 2013-07-07 MED ORDER — OXALIPLATIN CHEMO INJECTION 100 MG/20ML
85.0000 mg/m2 | Freq: Once | INTRAVENOUS | Status: AC
  Administered 2013-07-07: 150 mg via INTRAVENOUS
  Filled 2013-07-07: qty 30

## 2013-07-07 MED ORDER — ATROPINE SULFATE 1 MG/ML IJ SOLN
0.5000 mg | Freq: Once | INTRAMUSCULAR | Status: AC | PRN
  Administered 2013-07-07: 0.5 mg via INTRAVENOUS
  Filled 2013-07-07: qty 0.5

## 2013-07-07 MED ORDER — FLUOROURACIL CHEMO INJECTION 5 GM/100ML
2400.0000 mg/m2 | INTRAVENOUS | Status: DC
  Administered 2013-07-07: 4200 mg via INTRAVENOUS
  Filled 2013-07-07: qty 84

## 2013-07-07 MED ORDER — LEUCOVORIN CALCIUM INJECTION 350 MG: 400.0000 mg/m2 | Freq: Once | INTRAVENOUS | Status: DC

## 2013-07-07 MED ORDER — FLUOROURACIL CHEMO INJECTION 2.5 GM/50ML
400.0000 mg/m2 | Freq: Once | INTRAVENOUS | Status: AC
  Administered 2013-07-07: 700 mg via INTRAVENOUS
  Filled 2013-07-07: qty 14

## 2013-07-07 MED ORDER — DEXAMETHASONE SODIUM PHOSPHATE 10 MG/ML IJ SOLN: 20.0000 mg | Freq: Once | INTRAMUSCULAR | Status: DC

## 2013-07-07 NOTE — Progress Notes (Signed)
Patient seen on Friday for regular scheduled follow-up appointment.  She may have had mild ascites but was only mildly symptomatic of the abdominal distension.  See my note from that date for more details.   Pre-chemo labs on Friday show that she meets treatment parameters.   As a result, she was in a bed for chemotherapy (FOLFIRINOX) and she noted increased abdominal bloating.  I was therefore asked to see her quickly.   On inspection of abdomen, it was noted that her distension is much more obvious.  I therefore set her up for an [place] of abdomen with therapeutic paracentesis.  She was noted on [place] to have moderate ascites and therefore a therapeutic paracentesis was performed leading to 2.4 L of yellow/brown fluid.  She tolerated the procedure well and admits to immediate symptoms relief.  She is grateful for this procedure.   [name]

## 2013-07-07 NOTE — Progress Notes (Signed)
Pt for right side paracentesis with Dr. Fredirick Lathe. Pt given lidocaine 1% 4 ml by MD. Pt tolerating procedure no signs of distress.

## 2013-07-07 NOTE — Progress Notes (Addendum)
Paracentesis complete yellow brown abdominal removed. Pt without complaint no signs of distress.

## 2013-07-07 NOTE — Progress Notes (Signed)
Patient returned to clinic at 1630 with c/o hands numb and tingling. Placed her hand in warm blanket and this reversed. 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 Encouraged to bring gloves with her to treatment.

## 2013-07-07 NOTE — Progress Notes (Signed)
Patient arrived today for chemo. Counts good. She continues to c/o "bloating" and abdomen markedly distended. Discussed with Jenita Seashore PA. To u/s for u/s of abdomen and poss paracentesis. Report to radiology staff and RN in procedure with patient at 1210  1300 patient returned from x ray after paracentesis and reports "much" relief. Sitting up on bedside eating lunch.  1520 patient c/o cramping post irinotecan,  Atropine given. 1600 denies any pain at this time. Cramping relieved by atropine. Tolerated chemo well.

## 2013-07-09 ENCOUNTER — Encounter (HOSPITAL_BASED_OUTPATIENT_CLINIC_OR_DEPARTMENT_OTHER): Payer: Medicare Other

## 2013-07-09 ENCOUNTER — Ambulatory Visit (HOSPITAL_COMMUNITY): Payer: Medicare Other

## 2013-07-09 ENCOUNTER — Ambulatory Visit (HOSPITAL_COMMUNITY): Payer: Medicare Other | Admitting: Oncology

## 2013-07-09 VITALS — BP 142/85 | HR 75 | Temp 98.0°F

## 2013-07-09 DIAGNOSIS — C259 Malignant neoplasm of pancreas, unspecified: Secondary | ICD-10-CM

## 2013-07-09 DIAGNOSIS — Z5189 Encounter for other specified aftercare: Secondary | ICD-10-CM

## 2013-07-09 MED ORDER — HEPARIN SOD (PORK) LOCK FLUSH 100 UNIT/ML IV SOLN
INTRAVENOUS | Status: AC
  Filled 2013-07-09: qty 5

## 2013-07-09 MED ORDER — PEGFILGRASTIM INJECTION 6 MG/0.6ML
SUBCUTANEOUS | Status: AC
  Filled 2013-07-09: qty 0.6

## 2013-07-09 MED ORDER — SODIUM CHLORIDE 0.9 % IJ SOLN
10.0000 mL | INTRAMUSCULAR | Status: DC | PRN
Start: 2013-07-09 — End: 2013-07-09
  Administered 2013-07-09: 10 mL
  Filled 2013-07-09: qty 10

## 2013-07-09 MED ORDER — HEPARIN SOD (PORK) LOCK FLUSH 100 UNIT/ML IV SOLN
500.0000 [IU] | Freq: Once | INTRAVENOUS | Status: AC | PRN
  Administered 2013-07-09: 500 [IU]
  Filled 2013-07-09: qty 5

## 2013-07-09 MED ORDER — PEGFILGRASTIM INJECTION 6 MG/0.6ML
6.0000 mg | Freq: Once | SUBCUTANEOUS | Status: AC
Start: 2013-07-09 — End: 2013-07-09
  Administered 2013-07-09: 6 mg via SUBCUTANEOUS

## 2013-07-09 NOTE — Progress Notes (Signed)
[name] presented for Portacath de-access and flush and Neulasta as ordered.  Proper placement of portacath confirmed by CXR.  Portacath located left chest wall accessed with  H 20 needle.  Good blood return present. Portacath flushed with 20ml NS and 500U/50ml Heparin and needle removed intact.  Procedure tolerated well and without incident.

## 2013-07-21 ENCOUNTER — Other Ambulatory Visit (HOSPITAL_COMMUNITY): Payer: Self-pay | Admitting: Oncology

## 2013-07-21 ENCOUNTER — Encounter (HOSPITAL_BASED_OUTPATIENT_CLINIC_OR_DEPARTMENT_OTHER): Payer: Medicare Other

## 2013-07-21 DIAGNOSIS — C787 Secondary malignant neoplasm of liver and intrahepatic bile duct: Secondary | ICD-10-CM

## 2013-07-21 DIAGNOSIS — Z5111 Encounter for antineoplastic chemotherapy: Secondary | ICD-10-CM

## 2013-07-21 DIAGNOSIS — C259 Malignant neoplasm of pancreas, unspecified: Secondary | ICD-10-CM

## 2013-07-21 DIAGNOSIS — C7800 Secondary malignant neoplasm of unspecified lung: Secondary | ICD-10-CM

## 2013-07-21 LAB — CBC WITH DIFFERENTIAL/PLATELET
Basophils Relative: 0 % (ref 0–1)
Eosinophils Absolute: 0.7 10*3/uL (ref 0.0–0.7)
Eosinophils Relative: 3 % (ref 0–5)
Hemoglobin: 8.8 g/dL — ABNORMAL LOW (ref 12.0–15.0)
Lymphs Abs: 3.1 10*3/uL (ref 0.7–4.0)
MCH: 28.9 pg (ref 26.0–34.0)
MCHC: 32.4 g/dL (ref 30.0–36.0)
MCV: 89.5 fL (ref 78.0–100.0)
Monocytes Absolute: 1.5 10*3/uL — ABNORMAL HIGH (ref 0.1–1.0)
Monocytes Relative: 7 % (ref 3–12)
RBC: 3.04 MIL/uL — ABNORMAL LOW (ref 3.87–5.11)

## 2013-07-21 LAB — COMPREHENSIVE METABOLIC PANEL
Alkaline Phosphatase: 184 U/L — ABNORMAL HIGH (ref 39–117)
BUN: 14 mg/dL (ref 6–23)
Calcium: 8.5 mg/dL (ref 8.4–10.5)
Creatinine, Ser: 1.12 mg/dL — ABNORMAL HIGH (ref 0.50–1.10)
GFR calc Af Amer: 58 mL/min — ABNORMAL LOW (ref >90)
Glucose, Bld: 182 mg/dL — ABNORMAL HIGH (ref 70–99)
Total Protein: 6.2 g/dL (ref 6.0–8.3)

## 2013-07-21 MED ORDER — ATROPINE SULFATE 1 MG/ML IJ SOLN
0.5000 mg | Freq: Once | INTRAMUSCULAR | Status: AC | PRN
  Administered 2013-07-21: 0.5 mg via INTRAVENOUS
  Filled 2013-07-21: qty 0.5

## 2013-07-21 MED ORDER — DEXAMETHASONE SODIUM PHOSPHATE 10 MG/ML IJ SOLN: 20.0000 mg | Freq: Once | INTRAMUSCULAR | Status: DC

## 2013-07-21 MED ORDER — SODIUM CHLORIDE 0.9 % IV SOLN
Freq: Once | INTRAVENOUS | Status: AC
  Administered 2013-07-21: 10:00:00 via INTRAVENOUS

## 2013-07-21 MED ORDER — SODIUM CHLORIDE 0.9 % IV SOLN: 16.0000 mg | Freq: Once | INTRAVENOUS | Status: DC

## 2013-07-21 MED ORDER — IRINOTECAN HCL CHEMO INJECTION 100 MG/5ML
180.0000 mg/m2 | Freq: Once | INTRAVENOUS | Status: AC
  Administered 2013-07-21: 314 mg via INTRAVENOUS
  Filled 2013-07-21: qty 15.7

## 2013-07-21 MED ORDER — DEXTROSE 5 % IV SOLN
Freq: Once | INTRAVENOUS | Status: AC
  Administered 2013-07-21: 11:00:00 via INTRAVENOUS

## 2013-07-21 MED ORDER — LEUCOVORIN CALCIUM INJECTION 350 MG: 400.0000 mg/m2 | Freq: Once | INTRAVENOUS | Status: DC

## 2013-07-21 MED ORDER — DEXTROSE 5 % IV SOLN
85.0000 mg/m2 | Freq: Once | INTRAVENOUS | Status: AC
  Administered 2013-07-21: 150 mg via INTRAVENOUS
  Filled 2013-07-21: qty 30

## 2013-07-21 MED ORDER — LEUCOVORIN CALCIUM INJECTION 100 MG
20.0000 mg/m2 | Freq: Once | INTRAMUSCULAR | Status: AC
  Administered 2013-07-21: 34 mg via INTRAVENOUS
  Filled 2013-07-21: qty 1.7

## 2013-07-21 MED ORDER — HEPARIN SOD (PORK) LOCK FLUSH 100 UNIT/ML IV SOLN
500.0000 [IU] | Freq: Once | INTRAVENOUS | Status: DC | PRN
  Filled 2013-07-21: qty 5

## 2013-07-21 MED ORDER — SODIUM CHLORIDE 0.9 % IJ SOLN
10.0000 mL | INTRAMUSCULAR | Status: DC | PRN
  Filled 2013-07-21: qty 10

## 2013-07-21 MED ORDER — SODIUM CHLORIDE 0.9 % IV SOLN
2400.0000 mg/m2 | INTRAVENOUS | Status: DC
  Administered 2013-07-21: 4200 mg via INTRAVENOUS
  Filled 2013-07-21: qty 84

## 2013-07-21 MED ORDER — FLUOROURACIL CHEMO INJECTION 2.5 GM/50ML
400.0000 mg/m2 | Freq: Once | INTRAVENOUS | Status: AC
  Administered 2013-07-21: 700 mg via INTRAVENOUS
  Filled 2013-07-21: qty 14

## 2013-07-21 MED ORDER — SODIUM CHLORIDE 0.9 % IV SOLN
Freq: Once | INTRAVENOUS | Status: AC
  Administered 2013-07-21: 16 mg via INTRAVENOUS
  Filled 2013-07-21: qty 8

## 2013-07-21 NOTE — Progress Notes (Signed)
Tolerated chemo well. Discussed hemoglobin of 8.8 with patient and she denies being symptomatic. She knows to notify us if this changes or if she has abdominal discomfort from distention. Minimal cramping today during irinotecan, atropine given pre infusion.

## 2013-07-22 ENCOUNTER — Telehealth (HOSPITAL_COMMUNITY): Payer: Self-pay | Admitting: *Deleted

## 2013-07-22 NOTE — Telephone Encounter (Signed)
Patient states that she is doing great today! Patient's blood sugar this am was 201. She is out and about in town today.

## 2013-07-23 ENCOUNTER — Encounter (HOSPITAL_BASED_OUTPATIENT_CLINIC_OR_DEPARTMENT_OTHER): Payer: Medicare Other

## 2013-07-23 VITALS — BP 116/78 | HR 103 | Temp 99.1°F | Resp 18

## 2013-07-23 DIAGNOSIS — C259 Malignant neoplasm of pancreas, unspecified: Secondary | ICD-10-CM

## 2013-07-23 DIAGNOSIS — C787 Secondary malignant neoplasm of liver and intrahepatic bile duct: Secondary | ICD-10-CM

## 2013-07-23 LAB — COMPREHENSIVE METABOLIC PANEL
AST: 143 U/L — ABNORMAL HIGH (ref 0–37)
Albumin: 2.4 g/dL — ABNORMAL LOW (ref 3.5–5.2)
Alkaline Phosphatase: 332 U/L — ABNORMAL HIGH (ref 39–117)
BUN: 26 mg/dL — ABNORMAL HIGH (ref 6–23)
CO2: 25 mEq/L (ref 19–32)
Chloride: 99 mEq/L (ref 96–112)
GFR calc non Af Amer: 47 mL/min — ABNORMAL LOW (ref >90)
Potassium: 4.2 mEq/L (ref 3.5–5.1)
Total Bilirubin: 1 mg/dL (ref 0.3–1.2)

## 2013-07-23 LAB — CBC WITH DIFFERENTIAL/PLATELET
Basophils Absolute: 0 10*3/uL (ref 0.0–0.1)
Basophils Relative: 0 % (ref 0–1)
HCT: 29.2 % — ABNORMAL LOW (ref 36.0–46.0)
Hemoglobin: 9.7 g/dL — ABNORMAL LOW (ref 12.0–15.0)
Lymphocytes Relative: 4 % — ABNORMAL LOW (ref 12–46)
MCHC: 33.2 g/dL (ref 30.0–36.0)
Monocytes Relative: 2 % — ABNORMAL LOW (ref 3–12)
Neutro Abs: 21 10*3/uL — ABNORMAL HIGH (ref 1.7–7.7)
Neutrophils Relative %: 94 % — ABNORMAL HIGH (ref 43–77)
WBC: 22.5 10*3/uL — ABNORMAL HIGH (ref 4.0–10.5)

## 2013-07-23 MED ORDER — SODIUM CHLORIDE 0.9 % IJ SOLN
10.0000 mL | INTRAMUSCULAR | Status: DC | PRN
  Administered 2013-07-23: 10 mL
  Filled 2013-07-23: qty 10

## 2013-07-23 MED ORDER — HEPARIN SOD (PORK) LOCK FLUSH 100 UNIT/ML IV SOLN
500.0000 [IU] | Freq: Once | INTRAVENOUS | Status: AC | PRN
  Administered 2013-07-23: 500 [IU]
  Filled 2013-07-23: qty 5

## 2013-07-23 MED ORDER — HEPARIN SOD (PORK) LOCK FLUSH 100 UNIT/ML IV SOLN
INTRAVENOUS | Status: AC
  Filled 2013-07-23: qty 5

## 2013-07-23 MED ORDER — PEGFILGRASTIM INJECTION 6 MG/0.6ML
6.0000 mg | Freq: Once | SUBCUTANEOUS | Status: AC
  Administered 2013-07-23: 6 mg via SUBCUTANEOUS

## 2013-07-23 MED ORDER — PEGFILGRASTIM INJECTION 6 MG/0.6ML
SUBCUTANEOUS | Status: AC
  Filled 2013-07-23: qty 0.6

## 2013-07-23 NOTE — Addendum Note (Signed)
Addended by: Dennie Maizes on: 07/23/2013 02:38 PM   Modules accepted: Orders

## 2013-07-23 NOTE — Progress Notes (Signed)
D/C continuous infusion pump. Labs drawn from port. Flushed port per protocol. [name] presents today for injection per MD orders. Neulasta 6mg  administered SQ in left Abdomen. Administration without incident. Patient tolerated well.

## 2013-07-25 ENCOUNTER — Other Ambulatory Visit (HOSPITAL_COMMUNITY): Payer: Self-pay

## 2013-07-25 DIAGNOSIS — C259 Malignant neoplasm of pancreas, unspecified: Secondary | ICD-10-CM

## 2013-07-27 ENCOUNTER — Emergency Department (HOSPITAL_COMMUNITY): Payer: Medicare Other

## 2013-07-27 ENCOUNTER — Inpatient Hospital Stay (HOSPITAL_COMMUNITY)
Admission: EM | Admit: 2013-07-27 | Discharge: 2013-08-07 | DRG: 871 | Disposition: A | Discharge status: Home / Self Care | Payer: Medicare Other | Attending: Family Medicine | Admitting: Family Medicine

## 2013-07-27 ENCOUNTER — Inpatient Hospital Stay (HOSPITAL_COMMUNITY): Payer: Medicare Other

## 2013-07-27 ENCOUNTER — Encounter (HOSPITAL_COMMUNITY): Payer: Self-pay | Admitting: *Deleted

## 2013-07-27 DIAGNOSIS — R1110 Vomiting, unspecified: Secondary | ICD-10-CM

## 2013-07-27 DIAGNOSIS — C7889 Secondary malignant neoplasm of other digestive organs: Secondary | ICD-10-CM | POA: Diagnosis present

## 2013-07-27 DIAGNOSIS — E876 Hypokalemia: Secondary | ICD-10-CM | POA: Diagnosis present

## 2013-07-27 DIAGNOSIS — R509 Fever, unspecified: Secondary | ICD-10-CM

## 2013-07-27 DIAGNOSIS — E119 Type 2 diabetes mellitus without complications: Secondary | ICD-10-CM

## 2013-07-27 DIAGNOSIS — M129 Arthropathy, unspecified: Secondary | ICD-10-CM | POA: Diagnosis present

## 2013-07-27 DIAGNOSIS — K8689 Other specified diseases of pancreas: Secondary | ICD-10-CM

## 2013-07-27 DIAGNOSIS — C259 Malignant neoplasm of pancreas, unspecified: Secondary | ICD-10-CM | POA: Diagnosis present

## 2013-07-27 DIAGNOSIS — F411 Generalized anxiety disorder: Secondary | ICD-10-CM | POA: Diagnosis present

## 2013-07-27 DIAGNOSIS — I10 Essential (primary) hypertension: Secondary | ICD-10-CM | POA: Diagnosis present

## 2013-07-27 DIAGNOSIS — D696 Thrombocytopenia, unspecified: Secondary | ICD-10-CM

## 2013-07-27 DIAGNOSIS — R197 Diarrhea, unspecified: Secondary | ICD-10-CM

## 2013-07-27 DIAGNOSIS — D649 Anemia, unspecified: Secondary | ICD-10-CM

## 2013-07-27 DIAGNOSIS — E8779 Other fluid overload: Secondary | ICD-10-CM | POA: Diagnosis present

## 2013-07-27 DIAGNOSIS — K831 Obstruction of bile duct: Secondary | ICD-10-CM | POA: Diagnosis present

## 2013-07-27 DIAGNOSIS — C786 Secondary malignant neoplasm of retroperitoneum and peritoneum: Secondary | ICD-10-CM | POA: Diagnosis present

## 2013-07-27 DIAGNOSIS — Z794 Long term (current) use of insulin: Secondary | ICD-10-CM

## 2013-07-27 DIAGNOSIS — C787 Secondary malignant neoplasm of liver and intrahepatic bile duct: Secondary | ICD-10-CM | POA: Diagnosis present

## 2013-07-27 DIAGNOSIS — K838 Other specified diseases of biliary tract: Secondary | ICD-10-CM

## 2013-07-27 DIAGNOSIS — D61810 Antineoplastic chemotherapy induced pancytopenia: Secondary | ICD-10-CM | POA: Diagnosis present

## 2013-07-27 DIAGNOSIS — E860 Dehydration: Secondary | ICD-10-CM | POA: Diagnosis present

## 2013-07-27 DIAGNOSIS — R188 Other ascites: Secondary | ICD-10-CM | POA: Diagnosis present

## 2013-07-27 DIAGNOSIS — T451X5A Adverse effect of antineoplastic and immunosuppressive drugs, initial encounter: Secondary | ICD-10-CM | POA: Diagnosis present

## 2013-07-27 DIAGNOSIS — Z87891 Personal history of nicotine dependence: Secondary | ICD-10-CM

## 2013-07-27 DIAGNOSIS — A4159 Other Gram-negative sepsis: Principal | ICD-10-CM | POA: Diagnosis present

## 2013-07-27 LAB — URINALYSIS, ROUTINE W REFLEX MICROSCOPIC
Glucose, UA: 500 mg/dL — AB
Ketones, ur: NEGATIVE mg/dL
Leukocytes, UA: NEGATIVE
Nitrite: NEGATIVE
Urobilinogen, UA: 1 mg/dL (ref 0.0–1.0)

## 2013-07-27 LAB — COMPREHENSIVE METABOLIC PANEL
ALT: 26 U/L (ref 0–35)
AST: 21 U/L (ref 0–37)
Alkaline Phosphatase: 391 U/L — ABNORMAL HIGH (ref 39–117)
CO2: 23 mEq/L (ref 19–32)
GFR calc Af Amer: 61 mL/min — ABNORMAL LOW (ref >90)
GFR calc non Af Amer: 53 mL/min — ABNORMAL LOW (ref >90)
Glucose, Bld: 230 mg/dL — ABNORMAL HIGH (ref 70–99)
Potassium: 3.6 mEq/L (ref 3.5–5.1)
Sodium: 133 mEq/L — ABNORMAL LOW (ref 135–145)
Total Protein: 6 g/dL (ref 6.0–8.3)

## 2013-07-27 LAB — CBC WITH DIFFERENTIAL/PLATELET
Band Neutrophils: 0 % (ref 0–10)
Blasts: 0 %
HCT: 25.8 % — ABNORMAL LOW (ref 36.0–46.0)
MCH: 29.4 pg (ref 26.0–34.0)
MCHC: 33.3 g/dL (ref 30.0–36.0)
MCV: 88.1 fL (ref 78.0–100.0)
Metamyelocytes Relative: 0 %
Promyelocytes Absolute: 0 %
RDW: 17.2 % — ABNORMAL HIGH (ref 11.5–15.5)

## 2013-07-27 LAB — URINE MICROSCOPIC-ADD ON

## 2013-07-27 MED ORDER — SODIUM CHLORIDE 0.9 % IV SOLN
1000.0000 mL | INTRAVENOUS | Status: DC
  Administered 2013-07-27: 1000 mL via INTRAVENOUS

## 2013-07-27 MED ORDER — SODIUM CHLORIDE 0.9 % IV SOLN
500.0000 mg | Freq: Three times a day (TID) | INTRAVENOUS | Status: DC
  Administered 2013-07-27 – 2013-08-07 (×32): 500 mg via INTRAVENOUS
  Filled 2013-07-27 (×36): qty 500

## 2013-07-27 MED ORDER — HYDROCODONE-ACETAMINOPHEN 5-325 MG PO TABS: 1.0000 | ORAL_TABLET | ORAL | Status: DC | PRN

## 2013-07-27 MED ORDER — INSULIN ASPART 100 UNIT/ML ~~LOC~~ SOLN
0.0000 [IU] | Freq: Three times a day (TID) | SUBCUTANEOUS | Status: DC
  Administered 2013-07-28 – 2013-07-30 (×4): 2 [IU] via SUBCUTANEOUS
  Administered 2013-07-31: 1 [IU] via SUBCUTANEOUS
  Administered 2013-07-31 (×2): 2 [IU] via SUBCUTANEOUS
  Administered 2013-08-01: 3 [IU] via SUBCUTANEOUS
  Administered 2013-08-01 – 2013-08-04 (×4): 1 [IU] via SUBCUTANEOUS
  Administered 2013-08-06 (×3): 2 [IU] via SUBCUTANEOUS
  Administered 2013-08-07 (×2): 1 [IU] via SUBCUTANEOUS

## 2013-07-27 MED ORDER — INSULIN GLARGINE 100 UNIT/ML ~~LOC~~ SOLN
20.0000 [IU] | Freq: Every day | SUBCUTANEOUS | Status: DC
  Administered 2013-07-28 – 2013-08-04 (×7): 20 [IU] via SUBCUTANEOUS
  Administered 2013-08-06: 10 [IU] via SUBCUTANEOUS
  Filled 2013-07-27 (×12): qty 0.2

## 2013-07-27 MED ORDER — SODIUM CHLORIDE 0.9 % IV SOLN
1000.0000 mL | Freq: Once | INTRAVENOUS | Status: AC
  Administered 2013-07-27: 1000 mL via INTRAVENOUS

## 2013-07-27 MED ORDER — ENOXAPARIN SODIUM 40 MG/0.4ML ~~LOC~~ SOLN
40.0000 mg | SUBCUTANEOUS | Status: DC
  Administered 2013-07-27 – 2013-08-06 (×11): 40 mg via SUBCUTANEOUS
  Filled 2013-07-27 (×11): qty 0.4

## 2013-07-27 MED ORDER — SODIUM CHLORIDE 0.9 % IV SOLN
INTRAVENOUS | Status: AC
  Filled 2013-07-27: qty 500

## 2013-07-27 MED ORDER — SODIUM CHLORIDE 0.9 % IV SOLN
INTRAVENOUS | Status: DC
  Administered 2013-07-27: 23:00:00 via INTRAVENOUS
  Administered 2013-07-28: 100 mL/h via INTRAVENOUS
  Administered 2013-07-29 – 2013-08-05 (×10): via INTRAVENOUS

## 2013-07-27 MED ORDER — ACETAMINOPHEN 500 MG PO TABS
1000.0000 mg | ORAL_TABLET | Freq: Once | ORAL | Status: DC
  Filled 2013-07-27: qty 2

## 2013-07-27 MED ORDER — ALPRAZOLAM 1 MG PO TABS
1.0000 mg | ORAL_TABLET | Freq: Three times a day (TID) | ORAL | Status: DC | PRN
  Administered 2013-07-28 – 2013-08-06 (×10): 1 mg via ORAL
  Filled 2013-07-27 (×10): qty 1

## 2013-07-27 MED ORDER — ACETAMINOPHEN 500 MG PO TABS
ORAL_TABLET | ORAL | Status: AC
  Administered 2013-07-27: 1000 mg
  Filled 2013-07-27: qty 2

## 2013-07-27 NOTE — ED Notes (Signed)
Dr lama in with patient 

## 2013-07-27 NOTE — ED Provider Notes (Signed)
Scribed for [name], MD, the patient was seen in room A337/A337-01. This chart was scribed by Lewanda Rife, ED scribe. Patient's care was started at 1854  CSN: [id number]     Arrival date & time 07/27/13  1744 History   First MD Initiated Contact with Patient 07/27/13 1808     Chief Complaint  Patient presents with  . Weakness   (Consider location/radiation/quality/duration/timing/severity/associated sxs/prior Treatment) The history is provided by the patient, a relative and the EMS personnel.  HPI Comments: [name] is a 67 y.o. female brought in by ambulance, who presents to the Emergency Department with a PMHx of Stage 4 Pancreatic cancer diagnosed June 2014 complaining of constant moderate weakness onset 5 PM today. Reports associated diarrhea (yesterday 6 episodes), fever 100.6, chills, and emesis (2 episodes yesterday). Reports last chemo was 6 days ago. Denies any aggravating or alleviating factors. Denies associated abdominal pain, dysuria, polyuria, sore throat, rhinorrhea, nausea, emesis (resolved today), and diarrhea (resolved today). Patient states yesterday she was trembling and had for about 45 minutes. She states at that time she was having chills. She reports she had nausea and vomited 3 times yesterday. She also states she had diarrhea about 8 times yesterday. She has not had vomiting or diarrhea today. She states she feels mildly weak. She reports she had a Neulasta shot this past week after her chemotherapy.   Reports pertinent hx of diabetes mellitus.   PCP Dr [name]  Past Medical History  Diagnosis Date  . Diabetes mellitus without complication   . Hypertension   . Anxiety   . Arthritis   . Pancreatic cancer    Past Surgical History  Procedure Laterality Date  . Abdominal hysterectomy    . Colonoscopy  2006    Dr. Jena Gauss, polyp, benign  . Ercp  05/10/2013    Procedure: ENDOSCOPIC RETROGRADE CHOLANGIOPANCREATOGRAPHY (ERCP)(DIFFICULT  CANNULATION) ;  Surgeon: Malissa Hippo, MD;  Location: [facility];  Service: Endoscopy;;  . Biliary stent placement  05/10/2013    Procedure: BILIARY STENT PLACEMENT ( X WALLFLEX STENT);  Surgeon: Malissa Hippo, MD;  Location: [facility];  Service: Endoscopy;;  . Sphincterotomy  05/10/2013    Procedure: SPHINCTEROTOMY (PRECUT MADE WITH NEEDLE KNIFE);  Surgeon: Malissa Hippo, MD;  Location: [facility];  Service: Endoscopy;;  . Liver biopsy  05/12/2013  . Portacath placement Left 05/23/2013    Procedure: INSERTION PORT-A-CATH;  Surgeon: Fabio Bering, MD;  Location: [facility];  Service: General;  Laterality: Left;   Family History  Problem Relation Age of Onset  . Colon cancer Neg Hx   . Liver disease Neg Hx   . Bone cancer Brother   . Cancer Brother   . Hypertension Mother    History  Substance Use Topics  . Smoking status: Former Smoker -- 1.00 packs/day for 20 years  . Smokeless tobacco: Former Neurosurgeon    Quit date: 03/27/2013  . Alcohol Use: No   Lives at home Lives with daughter, SIL and GD  OB History   Grav Para Term Preterm Abortions TAB SAB Ect Mult Living   5 5 5       5      Review of Systems  Constitutional: Positive for fever.  Neurological: Positive for weakness.  Psychiatric/Behavioral: Negative for confusion.   A complete 10 system review of systems was obtained and all systems are negative except as noted in the HPI and PMH.    Allergies  Review of patient's  allergies indicates no known allergies.  Home Medications   No current outpatient prescriptions on file.   ED Triage Vitals  Enc Vitals Group     BP 07/27/13 1752 111/55 mmHg     Pulse Rate 07/27/13 1752 140     Resp 07/27/13 1752 20     Temp 07/27/13 1752 100.6 F (38.1 C)     Temp src 07/27/13 1752 Oral     SpO2 07/27/13 1752 98 %     Weight 07/27/13 1752 158 lb (71.668 kg)     Height 07/27/13 1752 5\' 5"  (1.651 m)     Head Cir --      Peak Flow --      Pain Score 07/27/13 1746 0     Pain Loc  --      Pain Edu? --      Excl. in GC? --     Vital signs normal except for tachycardia and low-grade temp  Physical Exam  Nursing note and vitals reviewed. Constitutional: She appears well-developed and well-nourished.  Non-toxic appearance. She does not appear ill. No distress.  HENT:  Head: Normocephalic and atraumatic.  Right Ear: External ear normal.  Left Ear: External ear normal.  Nose: Nose normal. No mucosal edema or rhinorrhea.  Mouth/Throat: Oropharynx is clear and moist. Mucous membranes are dry. No dental abscesses or edematous.  Eyes: Conjunctivae and EOM are normal. Pupils are equal, round, and reactive to light.  Neck: Normal range of motion and full passive range of motion without pain. Neck supple.  Cardiovascular: Regular rhythm and normal heart sounds.  Tachycardia present.  Exam reveals no gallop and no friction rub.   No murmur heard. Pulmonary/Chest: Effort normal and breath sounds normal. No respiratory distress. She has no wheezes. She has no rhonchi. She has no rales. She exhibits no tenderness and no crepitus.  Abdominal: Soft. Normal appearance and bowel sounds are normal. She exhibits no distension. There is no tenderness. There is no rebound and no guarding.  Musculoskeletal: Normal range of motion. She exhibits no edema and no tenderness.  Moves all extremities well.   Neurological: She is alert. She has normal strength. No cranial nerve deficit.  Oriented x 2 not sure of date   Skin: Skin is warm, dry and intact. No rash noted. No erythema. No pallor.  Skin hot to touch, I had her temperature rechecked and her temperature had gone up to 102.8  Psychiatric: She has a normal mood and affect. Her speech is normal and behavior is normal. Her mood appears not anxious.  Pt thought she was in the ER yesterday, but family states she was not     ED Course  Procedures (including critical care time) Medications  imipenem-cilastatin (PRIMAXIN) 500 mg in sodium  chloride 0.9 % 100 mL IVPB (500 mg Intravenous New Bag/Given 07/27/13 2141)  0.9 %  sodium chloride infusion ( Intravenous New Bag/Given 07/27/13 2253)  insulin glargine (LANTUS) injection 20 Units (20 Units Subcutaneous Given 07/28/13 0017)  ALPRAZolam (XANAX) tablet 1 mg (not administered)  enoxaparin (LOVENOX) injection 40 mg (40 mg Subcutaneous Given 07/27/13 2253)  HYDROcodone-acetaminophen (NORCO/VICODIN) 5-325 MG per tablet 1-2 tablet (not administered)  insulin aspart (novoLOG) injection 0-9 Units (not administered)  iohexol (OMNIPAQUE) 300 MG/ML solution 50 mL (not administered)  iohexol (OMNIPAQUE) 300 MG/ML solution 100 mL (not administered)  0.9 %  sodium chloride infusion (1,000 mLs Intravenous New Bag/Given 07/27/13 1916)  acetaminophen (TYLENOL) 500 MG tablet (1,000 mg  Given 07/27/13  1920)   19:41 Radiologist called CXR report about her port-a-cath placement.   9:43 PM labs explained and need for admission explained. Pt started on antibiotics.  21: 64 Dr Sharl Ma, admit to tele, team 2  Labs Review  Results for orders placed during the hospital encounter of 07/27/13  CBC WITH DIFFERENTIAL      Result Value Range   WBC 3.7 (*) 4.0 - 10.5 K/uL   RBC 2.93 (*) 3.87 - 5.11 MIL/uL   Hemoglobin 8.6 (*) 12.0 - 15.0 g/dL   HCT 09.8 (*) 11.9 - 14.7 %   MCV 88.1  78.0 - 100.0 fL   MCH 29.4  26.0 - 34.0 pg   MCHC 33.3  30.0 - 36.0 g/dL   RDW 82.9 (*) 56.2 - 13.0 %   Platelets 58 (*) 150 - 400 K/uL   Neutrophils Relative % 88 (*) 43 - 77 %   Lymphocytes Relative 7 (*) 12 - 46 %   Monocytes Relative 4  3 - 12 %   Eosinophils Relative 1  0 - 5 %   Basophils Relative 0  0 - 1 %   Band Neutrophils 0  0 - 10 %   Metamyelocytes Relative 0     Myelocytes 0     Promyelocytes Absolute 0     Blasts 0     nRBC 0  0 /100 WBC   Neutro Abs 3.3  1.7 - 7.7 K/uL   Lymphs Abs 0.3 (*) 0.7 - 4.0 K/uL   Monocytes Absolute 0.1  0.1 - 1.0 K/uL   Eosinophils Absolute 0.0  0.0 - 0.7 K/uL   Basophils  Absolute 0.0  0.0 - 0.1 K/uL  COMPREHENSIVE METABOLIC PANEL      Result Value Range   Sodium 133 (*) 135 - 145 mEq/L   Potassium 3.6  3.5 - 5.1 mEq/L   Chloride 98  96 - 112 mEq/L   CO2 23  19 - 32 mEq/L   Glucose, Bld 230 (*) 70 - 99 mg/dL   BUN 28 (*) 6 - 23 mg/dL   Creatinine, Ser 8.65  0.50 - 1.10 mg/dL   Calcium 8.5  8.4 - 78.4 mg/dL   Total Protein 6.0  6.0 - 8.3 g/dL   Albumin 2.1 (*) 3.5 - 5.2 g/dL   AST 21  0 - 37 U/L   ALT 26  0 - 35 U/L   Alkaline Phosphatase 391 (*) 39 - 117 U/L   Total Bilirubin 0.6  0.3 - 1.2 mg/dL   GFR calc non Af Amer 53 (*) >90 mL/min   GFR calc Af Amer 61 (*) >90 mL/min  URINALYSIS, ROUTINE W REFLEX MICROSCOPIC      Result Value Range   Color, Urine YELLOW  YELLOW   APPearance CLEAR  CLEAR   Specific Gravity, Urine 1.015  1.005 - 1.030   pH 6.0  5.0 - 8.0   Glucose, UA 500 (*) NEGATIVE mg/dL   Hgb urine dipstick SMALL (*) NEGATIVE   Bilirubin Urine NEGATIVE  NEGATIVE   Ketones, ur NEGATIVE  NEGATIVE mg/dL   Protein, ur TRACE (*) NEGATIVE mg/dL   Urobilinogen, UA 1.0  0.0 - 1.0 mg/dL   Nitrite NEGATIVE  NEGATIVE   Leukocytes, UA NEGATIVE  NEGATIVE  URINE MICROSCOPIC-ADD ON      Result Value Range   Squamous Epithelial / LPF FEW (*) RARE   WBC, UA 0-2  <3 WBC/hpf   RBC / HPF 0-2  <3 RBC/hpf  Laboratory interpretation all normal except low total white blood cell count, and his thrombocytopenia, mild worsening of her stable anemia  July 23, 2013: Hemoglobin 9.7, white count 22.5, normal platelets   Imaging Review  Dg Chest 2 View  07/27/2013   *RADIOLOGY REPORT*  Clinical Data: Weakness and fever.  Recent chemotherapy. Pancreatic cancer.  CHEST - 2 VIEW  Comparison: Pleural chest radiograph 05/23/2013  Findings: Heart size is upper normal.  Mediastinal and hilar contours are stable.  Left subclavian power Port-A-Cath is present. Since the chest radiograph of 05/23/2013, the catheter appears to have been slightly retracted, and now  curves posteriorly, as best seen on the lateral view, with the distal catheter in the expected location of the azygos vein.  The lungs are normally expanded and clear.  No focal airspace opacity, mass, or pulmonary edema.  Negative for pleural effusion or pneumothorax.  No acute osseous abnormality.  IMPRESSION: 1. The distal tip of the left subclavian Port-A-Cath has changed in position since prior chest radiograph and is in the expected location of the azygos vein.  This finding was called to Dr. Lynelle Doctor 7:40 p.m. 07/27/2013. 2.  No acute cardiopulmonary disease identified.   Original Report Authenticated By: Britta Mccreedy, M.D.    MDM  patient presents after chemotherapy with a high fever up to 103 and chills with no obvious source of infection. She was given Neulasta however her white blood cell count now has dropped and she is not neutropenic but getting close. She has mild worsening of her chronic anemia and she now has thrombocytopenia.    1. Fever   2. Vomiting and diarrhea   3. Anemia   4. Thrombocytopenia    Plan admission   Devoria Albe, MD, FACEP   I personally performed the services described in this documentation, which was scribed in my presence. The recorded information has been reviewed and considered.  Devoria Albe, MD, FACEP    Ward Givens, MD 07/28/13 [phone]

## 2013-07-27 NOTE — H&P (Addendum)
PCP:   [name], MD   Chief Complaint:  Diarrhea  HPI: 67 year old female with a history of stage IV pancreatic cancer diagnosed in June of 2014, came to the ED after she had 12 episodes of lose bowel movements today along with fever causing extreme weakness. Patient got her last  chemotherapy on 07/21/13, she also received Neulasta on 07/23/13. She had fever of 102 and complains of shivering at home. Patient denies dysuria urgency or frequency of urination. Denies abdominal pain, denies chest pain or shortness of breath. Patient has history of malignant ascites and had paracentesis performed on 07/07/2013 when they removed 2400 mL of yellow-brown abdominal fluid. She also had vomiting today at home.  Allergies:  No Known Allergies    Past Medical History  Diagnosis Date  . Diabetes mellitus without complication   . Hypertension   . Anxiety   . Arthritis   . Pancreatic cancer     Past Surgical History  Procedure Laterality Date  . Abdominal hysterectomy    . Colonoscopy  2006    Dr. Jena Gauss, polyp, benign  . Ercp  05/10/2013    Procedure: ENDOSCOPIC RETROGRADE CHOLANGIOPANCREATOGRAPHY (ERCP)(DIFFICULT CANNULATION) ;  Surgeon: Malissa Hippo, MD;  Location: [facility];  Service: Endoscopy;;  . Biliary stent placement  05/10/2013    Procedure: BILIARY STENT PLACEMENT ( X WALLFLEX STENT);  Surgeon: Malissa Hippo, MD;  Location: [facility];  Service: Endoscopy;;  . Sphincterotomy  05/10/2013    Procedure: SPHINCTEROTOMY (PRECUT MADE WITH NEEDLE KNIFE);  Surgeon: Malissa Hippo, MD;  Location: [facility];  Service: Endoscopy;;  . Liver biopsy  05/12/2013  . Portacath placement Left 05/23/2013    Procedure: INSERTION PORT-A-CATH;  Surgeon: Fabio Bering, MD;  Location: [facility];  Service: General;  Laterality: Left;    Prior to Admission medications   Medication Sig Start Date End Date Taking? Authorizing Provider  ALPRAZolam Prudy Feeler) 1 MG tablet Take 1 mg by mouth 3 (three) times  daily as needed for sleep or anxiety.   Yes Historical Provider, MD  amLODipine (NORVASC) 5 MG tablet Take 5 mg by mouth daily.   Yes Historical Provider, MD  insulin glargine (LANTUS) 100 UNIT/ML injection Inject 20 Units into the skin at bedtime.    Yes Historical Provider, MD  loperamide (IMODIUM A-D) 2 MG tablet Take 2 tab at onset of diarrhea, then 1 tab q2h until 12 hr have passed without a BM.  May take 2 tab q4h qhs. If diarrhea recurs repeat. 05/22/13  Yes Randall An, MD  losartan (COZAAR) 100 MG tablet Take 100 mg by mouth daily.   Yes Historical Provider, MD  omeprazole (PRILOSEC) 20 MG capsule Take 1 capsule (20 mg total) by mouth daily. 06/13/13  Yes Maurine Minister Kefalas, PA-C  polyethylene glycol powder (GLYCOLAX/MIRALAX) powder Take 17 g by mouth daily.   Yes Historical Provider, MD  potassium chloride SA (K-DUR,KLOR-CON) 20 MEQ tablet Take 1 tablet (20 mEq total) by mouth 2 (two) times daily. 06/25/13  Yes Maurine Minister Kefalas, PA-C  dexamethasone (DECADRON) 4 MG tablet Starting the day after chemo, take 1 tab in the am and 1 tab in the pm for 3 days. Take with food. 05/22/13   Ellouise Newer, PA-C  lidocaine-prilocaine (EMLA) cream Apply topically as needed. Apply a quarter size amount to port site 1 hour prior to chemo. Do not rub in. Cover with plastic. 05/22/13   Ellouise Newer, PA-C  LORazepam (ATIVAN) 0.5 MG  tablet Take 0.5 mg by mouth. May take 1 -2 tabs every 6 hours IF needed for nausea/vomiting. Start with 1 tab and increase to 2 tabs if needed. (Do not take Xanax within 2 hours of taking Ativan).    Historical Provider, MD  ondansetron (ZOFRAN) 8 MG tablet The day after chemo, take 1 tab in am x 2 days. Then may take 1 tab two times a day IF needed for nausea/vomiting. 05/22/13   Ellouise Newer, PA-C    Social History:  reports that she has quit smoking. She quit smokeless tobacco use about 4 months ago. She reports that she does not drink alcohol or use illicit  drugs.  Family History  Problem Relation Age of Onset  . Colon cancer Neg Hx   . Liver disease Neg Hx   . Bone cancer Brother   . Cancer Brother   . Hypertension Mother      All the positives are listed in BOLD  Review of Systems:  HEENT: Headache, blurred vision, runny nose, sore throat Neck: Hypothyroidism, hyperthyroidism,,lymphadenopathy Chest : Shortness of breath, history of COPD, Asthma Heart : Chest pain, history of coronary arterey disease GI:  Nausea, vomiting, diarrhea, constipation, GERD GU: Dysuria, urgency, frequency of urination, hematuria Neuro: Stroke, seizures, syncope Psych: Depression, anxiety, hallucinations   Physical Exam: Blood pressure 109/74, pulse 112, temperature 99.5 F (37.5 C), temperature source Oral, resp. rate 22, height 5\' 5"  (1.651 m), weight 71.668 kg (158 lb), SpO2 99.00%. Constitutional:   Patient is a well-developed and well-nourished female in no acute distress and cooperative with exam. Head: Normocephalic and atraumatic Mouth: Mucus membranes moist Eyes: PERRL, EOMI, conjunctivae normal Neck: Supple, No Thyromegaly Cardiovascular: RRR, S1 normal, S2 normal Pulmonary/Chest: CTAB, no wheezes, rales, or rhonchi Abdominal: Soft. Non-tender, non-distended, bowel sounds are normal, no masses, organomegaly, or guarding present.  Neurological: A&O x3, Strenght is normal and symmetric bilaterally, cranial nerve II-XII are grossly intact, no focal motor deficit, sensory intact to light touch bilaterally.  Extremities : No Cyanosis, Clubbing or Edema   Labs on Admission:  Results for orders placed during the hospital encounter of 07/27/13 (from the past 48 hour(s))  CBC WITH DIFFERENTIAL     Status: Abnormal   Collection Time    07/27/13  7:13 PM      Result Value Range   WBC 3.7 (*) 4.0 - 10.5 K/uL   RBC 2.93 (*) 3.87 - 5.11 MIL/uL   Hemoglobin 8.6 (*) 12.0 - 15.0 g/dL   HCT 47.8 (*) 29.5 - 62.1 %   MCV 88.1  78.0 - 100.0 fL   MCH  29.4  26.0 - 34.0 pg   MCHC 33.3  30.0 - 36.0 g/dL   RDW 30.8 (*) 65.7 - 84.6 %   Platelets 58 (*) 150 - 400 K/uL   Neutrophils Relative % 88 (*) 43 - 77 %   Lymphocytes Relative 7 (*) 12 - 46 %   Monocytes Relative 4  3 - 12 %   Eosinophils Relative 1  0 - 5 %   Basophils Relative 0  0 - 1 %   Band Neutrophils 0  0 - 10 %   Metamyelocytes Relative 0     Myelocytes 0     Promyelocytes Absolute 0     Blasts 0     nRBC 0  0 /100 WBC   Neutro Abs 3.3  1.7 - 7.7 K/uL   Lymphs Abs 0.3 (*) 0.7 - 4.0 K/uL  Monocytes Absolute 0.1  0.1 - 1.0 K/uL   Eosinophils Absolute 0.0  0.0 - 0.7 K/uL   Basophils Absolute 0.0  0.0 - 0.1 K/uL  COMPREHENSIVE METABOLIC PANEL     Status: Abnormal   Collection Time    07/27/13  7:13 PM      Result Value Range   Sodium 133 (*) 135 - 145 mEq/L   Potassium 3.6  3.5 - 5.1 mEq/L   Chloride 98  96 - 112 mEq/L   CO2 23  19 - 32 mEq/L   Glucose, Bld 230 (*) 70 - 99 mg/dL   BUN 28 (*) 6 - 23 mg/dL   Creatinine, Ser 1.61  0.50 - 1.10 mg/dL   Calcium 8.5  8.4 - 09.6 mg/dL   Total Protein 6.0  6.0 - 8.3 g/dL   Albumin 2.1 (*) 3.5 - 5.2 g/dL   AST 21  0 - 37 U/L   ALT 26  0 - 35 U/L   Alkaline Phosphatase 391 (*) 39 - 117 U/L   Total Bilirubin 0.6  0.3 - 1.2 mg/dL   GFR calc non Af Amer 53 (*) >90 mL/min   GFR calc Af Amer 61 (*) >90 mL/min   Comment: (NOTE)     The eGFR has been calculated using the CKD EPI equation.     This calculation has not been validated in all clinical situations.     eGFR's persistently <90 mL/min signify possible Chronic Kidney     Disease.  URINALYSIS, ROUTINE W REFLEX MICROSCOPIC     Status: Abnormal   Collection Time    07/27/13  7:47 PM      Result Value Range   Color, Urine YELLOW  YELLOW   APPearance CLEAR  CLEAR   Specific Gravity, Urine 1.015  1.005 - 1.030   pH 6.0  5.0 - 8.0   Glucose, UA 500 (*) NEGATIVE mg/dL   Hgb urine dipstick SMALL (*) NEGATIVE   Bilirubin Urine NEGATIVE  NEGATIVE   Ketones, ur NEGATIVE   NEGATIVE mg/dL   Protein, ur TRACE (*) NEGATIVE mg/dL   Urobilinogen, UA 1.0  0.0 - 1.0 mg/dL   Nitrite NEGATIVE  NEGATIVE   Leukocytes, UA NEGATIVE  NEGATIVE  URINE MICROSCOPIC-ADD ON     Status: Abnormal   Collection Time    07/27/13  7:47 PM      Result Value Range   Squamous Epithelial / LPF FEW (*) RARE   WBC, UA 0-2  <3 WBC/hpf   RBC / HPF 0-2  <3 RBC/hpf    Radiological Exams on Admission: Dg Chest 2 View  07/27/2013   *RADIOLOGY REPORT*  Clinical Data: Weakness and fever.  Recent chemotherapy. Pancreatic cancer.  CHEST - 2 VIEW  Comparison: Pleural chest radiograph 05/23/2013  Findings: Heart size is upper normal.  Mediastinal and hilar contours are stable.  Left subclavian power Port-A-Cath is present. Since the chest radiograph of 05/23/2013, the catheter appears to have been slightly retracted, and now curves posteriorly, as best seen on the lateral view, with the distal catheter in the expected location of the azygos vein.  The lungs are normally expanded and clear.  No focal airspace opacity, mass, or pulmonary edema.  Negative for pleural effusion or pneumothorax.  No acute osseous abnormality.  IMPRESSION: 1. The distal tip of the left subclavian Port-A-Cath has changed in position since prior chest radiograph and is in the expected location of the azygos vein.  This finding was called to Dr.  Knapp 7:40 p.m. 07/27/2013. 2.  No acute cardiopulmonary disease identified.   Original Report Authenticated By: Britta Mccreedy, M.D.    Assessment/Plan Active Problems:   Obstructive jaundice   Dehydration   HTN (hypertension)   Diabetes mellitus   Malignant neoplasm of pancreas, part unspecified   Fever, unspecified   Diarrhea  Fever ? Cause, patient only has symptom of diarrhea with no other Source of infection identified at this time. Her UA is normal chest x-ray does not show pneumonia. We'll obtain CT abdomen pelvis to rule out GI pathology as the cause of patient's fever. Blood  cultures x2 and urine culture have been obtained. Patient has already received Primaxin in the ED we'll continue Primaxin every 8 hours. Patient is not neutropenic at this time, her WBCs is 3.7 with 88% neutrophils.   Pancytopenia  Secondary to chemotherapy Patient's white count has dropped from 22,000 to 3700. Platelet count is 58,000 We'll continue to monitor  Diabetes mellitus Will continue Lantus and start sliding scale insulin  Metastatic adenocarcinoma of pancreas Patient is getting chemotherapy as per oncology  Diarrhea Could be secondary to chemotherapy, or may be part of the infectious process in the GI. Tract We'll obtain CT abdomen pelvis to rule out colitis. We'll also obtain stool for C. difficile and stool culture  Anemia Likely due to anemia of chronic disease Patient's hemoglobin has remained stable Will continue to monitor the CBC in the hospital  Hypertension Will hold the antihypertensive at this time Patient's systolic blood pressure is around 110  DVT prophylaxis Lovenox   Code status: Full code  Family discussion: Discussed with patient and her granddaughter is at bedside   Time Spent on Admission: 60 min  [facility] Pager: [phone] 07/27/2013, 10:12 PM  If 7PM-7AM, please contact night-coverage  [email or web address]  Password [email or web address]

## 2013-07-27 NOTE — ED Notes (Signed)
Sudden onset of generalized weakness at 1600 today.  Patient had laid down earlier today and felt too weak to get up.  Temp 102 oral by EMS, HR 150.  Hx pancreatic cancer w/last treatment last Monday.

## 2013-07-28 DIAGNOSIS — C259 Malignant neoplasm of pancreas, unspecified: Secondary | ICD-10-CM

## 2013-07-28 DIAGNOSIS — R197 Diarrhea, unspecified: Secondary | ICD-10-CM

## 2013-07-28 LAB — COMPREHENSIVE METABOLIC PANEL
ALT: 27 U/L (ref 0–35)
AST: 24 U/L (ref 0–37)
Alkaline Phosphatase: 255 U/L — ABNORMAL HIGH (ref 39–117)
CO2: 21 mEq/L (ref 19–32)
Chloride: 99 mEq/L (ref 96–112)
GFR calc non Af Amer: 60 mL/min — ABNORMAL LOW (ref >90)
Potassium: 3.7 mEq/L (ref 3.5–5.1)
Sodium: 132 mEq/L — ABNORMAL LOW (ref 135–145)
Total Bilirubin: 0.5 mg/dL (ref 0.3–1.2)

## 2013-07-28 LAB — CBC
Platelets: 64 10*3/uL — ABNORMAL LOW (ref 150–400)
RBC: 2.71 MIL/uL — ABNORMAL LOW (ref 3.87–5.11)
WBC: 11 10*3/uL — ABNORMAL HIGH (ref 4.0–10.5)

## 2013-07-28 LAB — DIFFERENTIAL
Band Neutrophils: 43 % — ABNORMAL HIGH (ref 0–10)
Blasts: 0 %
Metamyelocytes Relative: 7 %
Monocytes Absolute: 0 10*3/uL — ABNORMAL LOW (ref 0.1–1.0)
Monocytes Relative: 0 % — ABNORMAL LOW (ref 3–12)
Promyelocytes Absolute: 0 %

## 2013-07-28 LAB — AMMONIA: Ammonia: 15 umol/L (ref 11–60)

## 2013-07-28 LAB — GLUCOSE, CAPILLARY: Glucose-Capillary: 220 mg/dL — ABNORMAL HIGH (ref 70–99)

## 2013-07-28 MED ORDER — LOPERAMIDE HCL 2 MG PO CAPS: 2.0000 mg | ORAL_CAPSULE | ORAL | Status: DC | PRN

## 2013-07-28 MED ORDER — INSULIN GLARGINE 100 UNIT/ML ~~LOC~~ SOLN
SUBCUTANEOUS | Status: AC
  Filled 2013-07-28: qty 10

## 2013-07-28 MED ORDER — SODIUM CHLORIDE 0.9 % IV SOLN
INTRAVENOUS | Status: AC
  Filled 2013-07-28: qty 500

## 2013-07-28 MED ORDER — IOHEXOL 300 MG/ML  SOLN
50.0000 mL | Freq: Once | INTRAMUSCULAR | Status: AC | PRN
  Administered 2013-07-28: 50 mL via ORAL

## 2013-07-28 MED ORDER — LOPERAMIDE HCL 2 MG PO CAPS
4.0000 mg | ORAL_CAPSULE | Freq: Once | ORAL | Status: AC
  Administered 2013-07-28: 4 mg via ORAL
  Filled 2013-07-28: qty 2

## 2013-07-28 MED ORDER — IOHEXOL 300 MG/ML  SOLN
100.0000 mL | Freq: Once | INTRAMUSCULAR | Status: AC | PRN
  Administered 2013-07-28: 100 mL via INTRAVENOUS

## 2013-07-28 NOTE — Care Management Note (Signed)
    Page 1 of 2   08/07/2013     2:29:26 PM   CARE MANAGEMENT NOTE 08/07/2013  Patient:  [name], [name]   Account Number:  [id number]  Date Initiated:  07/28/2013  Documentation initiated by:  Anibal Henderson  Subjective/Objective Assessment:   Oncology pt admitted with fever, chills, T 102. Pt is on chemo. She is from home, and has multiple family members available as needed to assist.     Action/Plan:   Will return home at D/C. No CM needs identified at present   Anticipated DC Date:  07/30/2013   Anticipated DC Plan:  HOME/SELF CARE      DC Planning Services  CM consult      Choice offered to / List presented to:          [facility] arranged  HH-1 RN  HH-10 DISEASE MANAGEMENT      HH agency  [facility]   Status of service:  Completed, signed off Medicare Important Message given?  YES (If response is "NO", the following Medicare IM given date fields will be blank) Date Medicare IM given:  08/07/2013 Date Additional Medicare IM given:    Discharge Disposition:  HOME W HOME HEALTH SERVICES  Per UR Regulation:    If discussed at Long Length of Stay Meetings, dates discussed:   08/05/2013  08/07/2013    Comments:  08/07/13 Rosemary Holms RN BNS CM Pt going home with Port, no PICC. To have IV AB qd times 10 days. [facility] provided RN services.  08/05/13 Rosemary Holms RN BSN CM Pt not ready for DC. Intensity of service and severity of service support inpt.  07/28/13 1440 Geneva Bpolden RN/CM

## 2013-07-28 NOTE — Progress Notes (Signed)
UR chart review completed.  

## 2013-07-28 NOTE — Progress Notes (Signed)
Notified Dr. Juanetta Gosling of the patients c/o of multiple loose stools and her request for medication.  New orders given and followed.

## 2013-07-28 NOTE — Progress Notes (Signed)
[id number] 

## 2013-07-28 NOTE — Progress Notes (Signed)
NAMEJULIANA, [name]                ACCOUNT NO.:  [id number]  MEDICAL RECORD NO.:  [id number]  LOCATION:  A337                          FACILITY:  [facility]  PHYSICIAN:  Melvyn Novas, MDDATE OF BIRTH:  1946/11/09  DATE OF PROCEDURE: DATE OF DISCHARGE:                                PROGRESS NOTE   The patient admitted yesterday with some recurrent diarrhea.  She has underlying pancreatic cancer with extension into her biliary tract as well as spleen.  She has some fever and chills, thought was given to underlying infection, however, blood cultures thus far are negative. Chest x-ray is essentially clear and the urinalysis reveals no evidence of pathogenic bacteria at present.  She was placed empirically on imipenem.  She likewise had chemotherapy on July 21, 2013, and is scheduled for chemotherapy again in 1 week from today.  She also received Neulasta.  PHYSICAL EXAMINATION:  GENERAL:  Currently, the patient is afebrile. The patient's temperature is 100.1, pulse is 115 and regular sinus rhythm, respiratory rate is 20, blood pressure 115/65, O2 sat is 100%. GENERAL:  Alert and oriented in very good spirits. Lungs are clear.  Prolonged expiratory phase.  No rales, wheeze, or rhonchi appreciable. HEART:  Regular rhythm.  No murmurs, gallops, or rubs. ABDOMEN:  Soft.  Mild epigastric discomfort.  Bowel sounds normoactive. No guarding or rebound.  There is some fluid wave noted.  LABORATORY DATA:  Hemoglobin is 8.0, white count climbed from 3.7-11 over 24-hour period.  CT scan of abdomen and pelvis reveals diffuse hepatic metastasis, possibly smaller than previous study indicating a possible response to chemotherapy.  She had metallic biliary stent placed by Dr. Karilyn Cota previously and appears well-positioned, however, left-sided ducts are distended.  Pancreas reveals large pancreatic tail mass 7 x 5 cm with direct invasion into the spleen and left upper pole of the kidney and  left adrenal gland.  The patient tolerates food well, has no nausea or vomiting, and has no dysphagia or odynophagia.  PLAN:  Right now is to continue Lovenox, continue Primaxin 500 mg IV q.8 hours.  Await blood cultures x2.  Urine culture.  No evidence of lung focus of infection.  Continue insulin sliding scale as well as 20 units of Lantus nightly and hydrocodone.  The patient looking forward to eating breakfast, this surprised me.  I have ordered consultation by Gastroenterology as well as Hematology, which Hematology will hopefully see her tomorrow after holiday weekend.    Melvyn Novas, MD    RMD/MEDQ  D:  07/28/2013  T:  07/28/2013  Job:  [id number]

## 2013-07-28 NOTE — Progress Notes (Signed)
CRITICAL VALUE ALERT  Critical value received:  Gram positive and negative rods  Date of notification:  07/28/13   Time of notification:  1107  Critical value read back: yes  Nurse who received alert:  Zannie Kehr, RN, BSN  MD notified (1st page):  Juanetta Gosling  Time of first page:  11:15  MD notified (2nd page):  Time of second page:  Responding MD:  Juanetta Gosling  Time MD responded:  11:16   No new orders need due to the patient already being on Primaxin per MD.

## 2013-07-28 NOTE — Progress Notes (Signed)
Assigned patient to me by accident. My patients name was Hailey not Quinn. Both admitted at same time.

## 2013-07-28 NOTE — Consult Note (Signed)
Referring Provider: No ref. provider found Primary Care Physician:  [name], MD Primary Gastroenterologist:  Dr. Karilyn Cota  Reason for Consultation:  Diarrhea; advanced pancreatic cancer  HPI:   Unfortunate, pleasant 67 year old lady with stage IV pancreatic cancer comes to the hospital yesterday with unrelenting chills.  Labs revealed a notable pancytopenia but not neutropenia. Chemotherapy as recently as last week. Neulasta last week. CT scan demonstrates   notable shrinkage of the hepatic metastasis. Previously placed metallic stent appears to be patent with air in the upstream biliary tree although there is some persisting upstream biliary dilation, her LFTs remain minimally elevated. She has been started on imipenem and has been cultured. This morning she feels much better and is not having any chills. Since her diagnosis of pancreatic cancer her abdominal pain has been well-controlled.  She has had some watery nonbloody diarrhea.  Past Medical History  Diagnosis Date  . Diabetes mellitus without complication   . Hypertension   . Anxiety   . Arthritis   . Pancreatic cancer     Past Surgical History  Procedure Laterality Date  . Abdominal hysterectomy    . Colonoscopy  2006    Dr. Jena Gauss, polyp, benign  . Ercp  05/10/2013    Procedure: ENDOSCOPIC RETROGRADE CHOLANGIOPANCREATOGRAPHY (ERCP)(DIFFICULT CANNULATION) ;  Surgeon: Malissa Hippo, MD;  Location: [facility];  Service: Endoscopy;;  . Biliary stent placement  05/10/2013    Procedure: BILIARY STENT PLACEMENT ( X WALLFLEX STENT);  Surgeon: Malissa Hippo, MD;  Location: [facility];  Service: Endoscopy;;  . Sphincterotomy  05/10/2013    Procedure: SPHINCTEROTOMY (PRECUT MADE WITH NEEDLE KNIFE);  Surgeon: Malissa Hippo, MD;  Location: [facility];  Service: Endoscopy;;  . Liver biopsy  05/12/2013  . Portacath placement Left 05/23/2013    Procedure: INSERTION PORT-A-CATH;  Surgeon: Fabio Bering, MD;  Location: [facility];   Service: General;  Laterality: Left;    Prior to Admission medications   Medication Sig Start Date End Date Taking? Authorizing Provider  ALPRAZolam Prudy Feeler) 1 MG tablet Take 1 mg by mouth 3 (three) times daily as needed for sleep or anxiety.   Yes Historical Provider, MD  amLODipine (NORVASC) 5 MG tablet Take 5 mg by mouth daily.   Yes Historical Provider, MD  insulin glargine (LANTUS) 100 UNIT/ML injection Inject 20 Units into the skin at bedtime.    Yes Historical Provider, MD  loperamide (IMODIUM A-D) 2 MG tablet Take 2 tab at onset of diarrhea, then 1 tab q2h until 12 hr have passed without a BM.  May take 2 tab q4h qhs. If diarrhea recurs repeat. 05/22/13  Yes Randall An, MD  losartan (COZAAR) 100 MG tablet Take 100 mg by mouth daily.   Yes Historical Provider, MD  omeprazole (PRILOSEC) 20 MG capsule Take 1 capsule (20 mg total) by mouth daily. 06/13/13  Yes Maurine Minister Kefalas, PA-C  polyethylene glycol powder (GLYCOLAX/MIRALAX) powder Take 17 g by mouth daily.   Yes Historical Provider, MD  potassium chloride SA (K-DUR,KLOR-CON) 20 MEQ tablet Take 1 tablet (20 mEq total) by mouth 2 (two) times daily. 06/25/13  Yes Maurine Minister Kefalas, PA-C  dexamethasone (DECADRON) 4 MG tablet Starting the day after chemo, take 1 tab in the am and 1 tab in the pm for 3 days. Take with food. 05/22/13   Ellouise Newer, PA-C  lidocaine-prilocaine (EMLA) cream Apply topically as needed. Apply a quarter size amount to port site 1 hour prior to chemo.  Do not rub in. Cover with plastic. 05/22/13   Ellouise Newer, PA-C  LORazepam (ATIVAN) 0.5 MG tablet Take 0.5 mg by mouth. May take 1 -2 tabs every 6 hours IF needed for nausea/vomiting. Start with 1 tab and increase to 2 tabs if needed. (Do not take Xanax within 2 hours of taking Ativan).    Historical Provider, MD  ondansetron (ZOFRAN) 8 MG tablet The day after chemo, take 1 tab in am x 2 days. Then may take 1 tab two times a day IF needed for nausea/vomiting. 05/22/13    Ellouise Newer, PA-C    Current Facility-Administered Medications  Medication Dose Route Frequency Provider Last Rate Last Dose  . 0.9 %  sodium chloride infusion   Intravenous Continuous Ward Givens, MD 100 mL/hr at 07/27/13 2253    . ALPRAZolam (XANAX) tablet 1 mg  1 mg Oral TID PRN Meredeth Ide, MD      . enoxaparin (LOVENOX) injection 40 mg  40 mg Subcutaneous Q24H Meredeth Ide, MD   40 mg at 07/27/13 2253  . HYDROcodone-acetaminophen (NORCO/VICODIN) 5-325 MG per tablet 1-2 tablet  1-2 tablet Oral Q4H PRN Meredeth Ide, MD      . imipenem-cilastatin (PRIMAXIN) 500 mg in sodium chloride 0.9 % 100 mL IVPB  500 mg Intravenous Q8H Ward Givens, MD 200 mL/hr at 07/28/13 0543 500 mg at 07/28/13 0543  . insulin aspart (novoLOG) injection 0-9 Units  0-9 Units Subcutaneous TID WC Meredeth Ide, MD   2 Units at 07/28/13 [id number]  . insulin glargine (LANTUS) injection 20 Units  20 Units Subcutaneous QHS Meredeth Ide, MD   20 Units at 07/28/13 0017    Allergies as of 07/27/2013  . (No Known Allergies)    Family History  Problem Relation Age of Onset  . Colon cancer Neg Hx   . Liver disease Neg Hx   . Bone cancer Brother   . Cancer Brother   . Hypertension Mother     History   Social History  . Marital Status: Divorced    Spouse Name: N/A    Number of Children: 5  . Years of Education: N/A   Occupational History  . Not on file.   Social History Main Topics  . Smoking status: Former Smoker -- 1.00 packs/day for 20 years  . Smokeless tobacco: Former Neurosurgeon    Quit date: 03/27/2013  . Alcohol Use: No  . Drug Use: No  . Sexual Activity: No   Other Topics Concern  . Not on file   Social History Narrative   Daughter, deceased age 81    Review of Systems: Gen: Dd sleep disorder CV: Denies chest pain, angina, palpitations, syncope, orthopnea, PND, peripheral edema, and claudication. Resp: Denies dyspnea at rest, dyspnea with exercise, cough, sputum, wheezing, coughing up blood, and  pleurisy. GI: Denies vomiting blood, jaundice, and fecal incontinence.   Denies dysphagia or odynophagia. Derm: Denies rash, itching, dry skin, hives, moles, warts, or unhealing ulcers.  Psych: Denies depression, anxiety, memory loss, suicidal ideation, hallucinations, paranoia, and confusion. Heme: Denies bruising, bleeding, and enlarged lymph nodes.   Physical Exam: Vital signs in last 24 hours: Temp:  [99.3 F (37.4 C)-102.8 F (39.3 C)] 100.1 F (37.8 C) (09/01 0535) Pulse Rate:  [107-140] 115 (09/01 0535) Resp:  [11-22] 20 (09/01 0535) BP: (100-118)/(55-77) 115/65 mmHg (09/01 0535) SpO2:  [98 %-100 %] 100 % (09/01 0535) Weight:  [158 lb (71.668 kg)-164 lb 0.4  oz (74.4 kg)] 164 lb 0.4 oz (74.4 kg) (09/01 0409) Last BM Date: 07/27/13 General:   Alert,  Well-developed, well-nourished, pleasant and cooperative in NAD Head:  Normocephalic and atraumatic. Eyes:  Sclera clear, no icterus.   Conjunctiva pink. Ears:  Normal auditory acuity. Nose:  No deformity, discharge,  or lesions. Mouth:  No deformity or lesions, dentition normal. Neck:  Supple; no masses or thyromegaly. Lungs:  Clear throughout to auscultation.   No wheezes, crackles, or rhonchi. No acute distress. Heart:  Regular rate and rhythm; no murmurs, clicks, rubs,  or gallops. Abdomen:  Soft, nontender and nondistended. No masses, hepatosplenomegaly or hernias noted. Normal bowel sounds, without guarding, and without rebound.   Rectal:  Deferred until time of colonoscopy.   Msk:  Symmetrical without gross deformities. Normal posture. Pulses:  Normal pulses noted.   Intake/Output from previous day: 08/31 0701 - 09/01 0700 In: 1245 [P.O.:480; I.V.:665; IV Piggyback:100] Out: -  Intake/Output this shift: Total I/O In: 240 [P.O.:240] Out: -   Lab Results:  Recent Labs  07/27/13 1913 07/28/13 0557  WBC 3.7* 11.0*  HGB 8.6* 8.0*  HCT 25.8* 23.7*  PLT 58* 64*   BMET  Recent Labs  07/27/13 1913  07/28/13 0557  NA 133* 132*  K 3.6 3.7  CL 98 99  CO2 23 21  GLUCOSE 230* 274*  BUN 28* 23  CREATININE 1.08 0.97  CALCIUM 8.5 7.8*   LFT  Recent Labs  07/28/13 0557  PROT 6.0  ALBUMIN 2.1*  AST 24  ALT 27  ALKPHOS 255*  BILITOT 0.5  Dg Chest 2 View  07/27/2013   *RADIOLOGY REPORT*  Clinical Data: Weakness and fever.  Recent chemotherapy. Pancreatic cancer.  CHEST - 2 VIEW  Comparison: Pleural chest radiograph 05/23/2013  Findings: Heart size is upper normal.  Mediastinal and hilar contours are stable.  Left subclavian power Port-A-Cath is present. Since the chest radiograph of 05/23/2013, the catheter appears to have been slightly retracted, and now curves posteriorly, as best seen on the lateral view, with the distal catheter in the expected location of the azygos vein.  The lungs are normally expanded and clear.  No focal airspace opacity, mass, or pulmonary edema.  Negative for pleural effusion or pneumothorax.  No acute osseous abnormality.  IMPRESSION: 1. The distal tip of the left subclavian Port-A-Cath has changed in position since prior chest radiograph and is in the expected location of the azygos vein.  This finding was called to Dr. Lynelle Doctor 7:40 p.m. 07/27/2013. 2.  No acute cardiopulmonary disease identified.   Original Report Authenticated By: Britta Mccreedy, M.D.   Ct Abdomen Pelvis W Contrast  07/28/2013   *RADIOLOGY REPORT*  Clinical Data: Fever and diarrhea.  CT ABDOMEN AND PELVIS WITH CONTRAST  Technique:  Multidetector CT imaging of the abdomen and pelvis was performed following the standard protocol during bolus administration of intravenous contrast.  Contrast: OMNIPAQUE IOHEXOL 300 MG/ML  SOLN, 50mL OMNIPAQUE IOHEXOL 300 MG/ML  SOLN  Comparison: 05/07/2013.  Findings:  BODY WALL: Unremarkable.  LOWER CHEST:  Mediastinum: Mild cardiomegaly.  Lungs/pleura: No consolidation.  There is a spiculated 1 cm nodule in the medial right base which is slightly smaller than before  (prior 12mm), although there is motion.  ABDOMEN/PELVIS:  Liver: Diffuse hepatic metastatic disease.  There is likely a response to treatment: the largest mass, subcapsular anterior right lobe, now measures 4.5 cm compared to 6 cm previously.  None of these contains new gas to suggest  infection.  Biliary: Interval placement of metallic biliary stent.  Stent appears well positioned.  Left-sided ducts are distended, but filled with gas.  Right-sided defects are less dilated than prior. Overall, no evidence for stent obstruction.  Pancreas: Large pancreatic tail mass is again seen, slightly less bulky, but still measuring 7 x 5 cm in axial span.  As before there is direct invasion into the spleen, left upper pole kidney, and left adrenal gland.  Spleen: Direct invasion of the hilum by pancreatic tail mass. There are new wedge shaped areas of hypo enhancement within the peripheral spleen, consistent with infarcts.  Adrenals: Indistinct plane between the left adrenal gland and pancreatic tail mass.  Kidneys and ureters: Ingrowth into the left kidney, as before.  No acute change such as urinary obstruction.  2.5 cm cyst in the left upper pole is again seen.  Bladder: Unremarkable.  Bowel: No obstruction.  No pericecal inflammatory change.  Retroperitoneum: No change in retroperitoneal adenopathy, with left periaortic node at the level of the left renal hilum still measuring 2.7 x 1.6 cm.  Peritoneum: Small-volume ascites, mainly around the liver.  Omental nodules are again seen, more bulky - especially in the supravesicular region.  Reproductive: Unremarkable.  Vascular: No acute abnormality.  OSSEOUS: No acute abnormalities. No suspicious lytic or blastic lesions.  IMPRESSION:  1.  No definite intra-abdominal infection. 2. Widely metastatic pancreatic adenocarcinoma. Right lower lung and hepatic metastatic disease shows modest response to therapy. However, peritoneal metastatic disease shows increasing bulk. 3.  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   Original Report Authenticated By: Tiburcio Pea   Impression: Pleasant 67 year old lady with metastatic pancreatic cancer admitted to the hospital with chills and pancytopenia but not neutropenia. She has been cultured and started on imipenem. She's feeling better this morning. She has had some watery diarrhea, the etiology of which is not well-defined this time. She certainly could have an enteric infection or side effects related to chemotherapy.  Objective evidence of tumor shrinkage on CT. She could be experiencing some symptoms of tumor necrosis.  She does not appear toxic to me at this time.   Recommendations:  Agree with empiric antibiotics and culture.  No further biliary intervention warranted at this time. Followup on stool studies. Agree with having oncology stop by. Will notify Dr. Karilyn Cota of the patient's admission.

## 2013-07-29 ENCOUNTER — Other Ambulatory Visit (HOSPITAL_COMMUNITY): Payer: Self-pay | Admitting: Oncology

## 2013-07-29 DIAGNOSIS — C259 Malignant neoplasm of pancreas, unspecified: Secondary | ICD-10-CM

## 2013-07-29 DIAGNOSIS — K838 Other specified diseases of biliary tract: Secondary | ICD-10-CM

## 2013-07-29 DIAGNOSIS — R197 Diarrhea, unspecified: Secondary | ICD-10-CM

## 2013-07-29 DIAGNOSIS — E876 Hypokalemia: Secondary | ICD-10-CM

## 2013-07-29 DIAGNOSIS — D649 Anemia, unspecified: Secondary | ICD-10-CM

## 2013-07-29 DIAGNOSIS — R1110 Vomiting, unspecified: Secondary | ICD-10-CM

## 2013-07-29 DIAGNOSIS — R509 Fever, unspecified: Secondary | ICD-10-CM

## 2013-07-29 DIAGNOSIS — K869 Disease of pancreas, unspecified: Secondary | ICD-10-CM

## 2013-07-29 DIAGNOSIS — C787 Secondary malignant neoplasm of liver and intrahepatic bile duct: Secondary | ICD-10-CM

## 2013-07-29 DIAGNOSIS — D696 Thrombocytopenia, unspecified: Secondary | ICD-10-CM

## 2013-07-29 LAB — CBC WITH DIFFERENTIAL/PLATELET
Basophils Absolute: 0 10*3/uL (ref 0.0–0.1)
Eosinophils Absolute: 0.1 10*3/uL (ref 0.0–0.7)
HCT: 23.4 % — ABNORMAL LOW (ref 36.0–46.0)
Lymphocytes Relative: 18 % (ref 12–46)
MCHC: 33.3 g/dL (ref 30.0–36.0)
Monocytes Relative: 11 % (ref 3–12)
Neutrophils Relative %: 69 % (ref 43–77)
Platelets: 74 10*3/uL — ABNORMAL LOW (ref 150–400)
RDW: 17.4 % — ABNORMAL HIGH (ref 11.5–15.5)
WBC: 6.6 10*3/uL (ref 4.0–10.5)

## 2013-07-29 LAB — URINE CULTURE

## 2013-07-29 LAB — HEPATIC FUNCTION PANEL
AST: 18 U/L (ref 0–37)
Albumin: 1.7 g/dL — ABNORMAL LOW (ref 3.5–5.2)
Alkaline Phosphatase: 164 U/L — ABNORMAL HIGH (ref 39–117)
Total Bilirubin: 0.5 mg/dL (ref 0.3–1.2)
Total Protein: 5.3 g/dL — ABNORMAL LOW (ref 6.0–8.3)

## 2013-07-29 LAB — BASIC METABOLIC PANEL
CO2: 22 mEq/L (ref 19–32)
GFR calc non Af Amer: 52 mL/min — ABNORMAL LOW (ref >90)
Glucose, Bld: 84 mg/dL (ref 70–99)
Potassium: 2.9 mEq/L — ABNORMAL LOW (ref 3.5–5.1)
Sodium: 134 mEq/L — ABNORMAL LOW (ref 135–145)

## 2013-07-29 LAB — GLUCOSE, CAPILLARY
Glucose-Capillary: 74 mg/dL (ref 70–99)
Glucose-Capillary: 116 mg/dL — ABNORMAL HIGH (ref 70–99)
Glucose-Capillary: 91 mg/dL (ref 70–99)

## 2013-07-29 LAB — IRON AND TIBC
Iron: <10 ug/dL — ABNORMAL LOW (ref 42–135)
UIBC: 152 ug/dL (ref 125–400)

## 2013-07-29 LAB — FERRITIN: Ferritin: 3266 ng/mL — ABNORMAL HIGH (ref 10–291)

## 2013-07-29 LAB — FOLATE: Folate: 14.5 ng/mL

## 2013-07-29 MED ORDER — POTASSIUM CHLORIDE CRYS ER 20 MEQ PO TBCR
20.0000 meq | EXTENDED_RELEASE_TABLET | Freq: Every day | ORAL | Status: AC
  Administered 2013-07-29 – 2013-07-30 (×2): 20 meq via ORAL
  Filled 2013-07-29 (×2): qty 1

## 2013-07-29 MED ORDER — POTASSIUM CHLORIDE 10 MEQ/100ML IV SOLN
INTRAVENOUS | Status: AC
  Filled 2013-07-29: qty 100

## 2013-07-29 MED ORDER — POTASSIUM CHLORIDE 10 MEQ/100ML IV SOLN
10.0000 meq | INTRAVENOUS | Status: AC
  Administered 2013-07-29 (×4): 10 meq via INTRAVENOUS
  Filled 2013-07-29 (×3): qty 100

## 2013-07-29 NOTE — Progress Notes (Signed)
Inpatient Diabetes Program Recommendations  AACE/ADA: New Consensus Statement on Inpatient Glycemic Control (2013)  Target Ranges:  Prepandial:   less than 140 mg/dL      Peak postprandial:   less than 180 mg/dL (1-2 hours)      Critically ill patients:  140 - 180 mg/dL  Results for AVERLY, ERICSON (MRN [id number]) as of 07/29/2013 11:28  Ref. Range 07/28/2013 07:35 07/28/2013 11:52 07/28/2013 17:00 07/28/2013 21:04 07/29/2013 07:30  Glucose-Capillary Latest Range: 70-99 mg/dL 409 (H) 811 (H) 914 (H) 109 (H) 74     Inpatient Diabetes Program Recommendations Diet: Please consider changing diet to Carb Modified diabetic diet (currently ordered Regular diet).  Thanks, Orlando Penner, RN, MSN, CCRN Diabetes Coordinator Inpatient Diabetes Program [phone]

## 2013-07-29 NOTE — Consult Note (Signed)
[facility] Consultation Oncology  Name: [name]      MRN: [id number]    Location: W098/J191-47  Date: 07/29/2013 Time:3:01 PM   REFERRING PHYSICIAN:  John Giovanni, MD  REASON FOR CONSULT:  Metastatic pancreatic cancer   DIAGNOSIS:  Metastatic pancreatic cancer  HISTORY OF PRESENT ILLNESS:   This is a 67 year old African American female who is well-known to the [facility] who is receiving chemotherapy, namely FOLFIRINOX.  She is D9 of cycle 5 of FOLFIRINOX with Neulasta support.  She reports that on Sunday she had severe shaking chills, without documented fever.  She reports that she had the urge to use the restroom during this shaking chill episode and unfortunately did not make it to the restroom in time.  She had a BM and urinated before getting to the restroom.  She reports that since then, she has been having multiple loose stools up to 7 per day.  She was tested for C. Diff by PCR and this was negative.  She reports that her stools are malodorous as well.   As a result of this, EMS was contacted and she reported to the ED on 07/27/2013.  She was documented to have fevers of 103 F with shaking chills.  Her work-up included blood cultures, which are negative to date, UA (negative), and chest x-ray (negative).   Therefore, source of infection is unknown.  Therefore, she was admitted to the hospital for further evaluation and treatment of fevers.    During hospitalization, a CT of abd/pelvis was performed and according to the report, she is displaying a mixed response to therapy with response in some areas while peritoneal metastatic disease increasing in bulk.  I will review these films with Dr. Sharia Reeve (Hem/Onc) and radiologist to further evaluate and determine whether this is an indication of progression, response, or a mixed response.   Since admission, the patient was started on primaxin (imipenem-cilastatin).  She denies any complaints today during our visit.   TMAX24hr is 100.9 F.    I personally reviewed and went over laboratory results with the patient. CBC from yesterday shows a Hgb of 8.0 g/dL, platelet count of 82,956, and WBC count of 11.0 with neutrophilia of 10.5.  Metabolic panel from today reveals a hypokalemia for which she is receiving IV K+.  She does admit to a generalized abdominal discomfort that is worsened with deep palpation, but she denies any significant pain or discomfort otherwise.   Oncologically, she denies any complaints and ROS questioning is negative.    PAST MEDICAL HISTORY:   Past Medical History  Diagnosis Date  . Diabetes mellitus without complication   . Hypertension   . Anxiety   . Arthritis   . Pancreatic cancer     ALLERGIES: No Known Allergies    MEDICATIONS: I have reviewed the patient's current medications.     PAST SURGICAL HISTORY Past Surgical History  Procedure Laterality Date  . Abdominal hysterectomy    . Colonoscopy  2006    Dr. Jena Gauss, polyp, benign  . Ercp  05/10/2013    Procedure: ENDOSCOPIC RETROGRADE CHOLANGIOPANCREATOGRAPHY (ERCP)(DIFFICULT CANNULATION) ;  Surgeon: Malissa Hippo, MD;  Location: [facility];  Service: Endoscopy;;  . Biliary stent placement  05/10/2013    Procedure: BILIARY STENT PLACEMENT ( X WALLFLEX STENT);  Surgeon: Malissa Hippo, MD;  Location: [facility];  Service: Endoscopy;;  . Sphincterotomy  05/10/2013    Procedure: SPHINCTEROTOMY (PRECUT MADE WITH NEEDLE KNIFE);  Surgeon: Malissa Hippo, MD;  Location: [facility];  Service: Endoscopy;;  . Liver biopsy  05/12/2013  . Portacath placement Left 05/23/2013    Procedure: INSERTION PORT-A-CATH;  Surgeon: Fabio Bering, MD;  Location: [facility];  Service: General;  Laterality: Left;    FAMILY HISTORY: Family History  Problem Relation Age of Onset  . Colon cancer Neg Hx   . Liver disease Neg Hx   . Bone cancer Brother   . Cancer Brother   . Hypertension Mother     SOCIAL HISTORY:  reports that she has quit  smoking. She quit smokeless tobacco use about 4 months ago. She reports that she does not drink alcohol or use illicit drugs.  PERFORMANCE STATUS: The patient's performance status is 1 - Symptomatic but completely ambulatory  PHYSICAL EXAM: Most Recent Vital Signs: Blood pressure 122/76, pulse 99, temperature 99.4 F (37.4 C), temperature source Oral, resp. rate 19, height 5\' 6"  (1.676 m), weight 164 lb 0.4 oz (74.4 kg), SpO2 99.00%. General appearance: alert, cooperative, appears stated age and no distress Head: Normocephalic, without obvious abnormality, atraumatic Neck: no adenopathy and supple, symmetrical, trachea midline Back: symmetric, no curvature. ROM normal. No CVA tenderness. Lungs: clear to auscultation bilaterally Heart: regular rate and rhythm, S1, S2 normal, no murmur, click, rub or gallop Abdomen: abnormal findings:  soft with mild tenderness throughout on deep palpation Extremities: no edema, redness or tenderness in the calves or thighs Skin: Skin color, texture, turgor normal. No rashes or lesions Neurologic: Alert and oriented X 3, normal strength and tone. Normal symmetric reflexes. Normal coordination and gait  LABORATORY DATA:  Results for orders placed during the hospital encounter of 07/27/13 (from the past 48 hour(s))  CBC WITH DIFFERENTIAL     Status: Abnormal   Collection Time    07/27/13  7:13 PM      Result Value Range   WBC 3.7 (*) 4.0 - 10.5 K/uL   RBC 2.93 (*) 3.87 - 5.11 MIL/uL   Hemoglobin 8.6 (*) 12.0 - 15.0 g/dL   HCT 16.1 (*) 09.6 - 04.5 %   MCV 88.1  78.0 - 100.0 fL   MCH 29.4  26.0 - 34.0 pg   MCHC 33.3  30.0 - 36.0 g/dL   RDW 40.9 (*) 81.1 - 91.4 %   Platelets 58 (*) 150 - 400 K/uL   Neutrophils Relative % 88 (*) 43 - 77 %   Lymphocytes Relative 7 (*) 12 - 46 %   Monocytes Relative 4  3 - 12 %   Eosinophils Relative 1  0 - 5 %   Basophils Relative 0  0 - 1 %   Band Neutrophils 0  0 - 10 %   Metamyelocytes Relative 0     Myelocytes 0      Promyelocytes Absolute 0     Blasts 0     nRBC 0  0 /100 WBC   Neutro Abs 3.3  1.7 - 7.7 K/uL   Lymphs Abs 0.3 (*) 0.7 - 4.0 K/uL   Monocytes Absolute 0.1  0.1 - 1.0 K/uL   Eosinophils Absolute 0.0  0.0 - 0.7 K/uL   Basophils Absolute 0.0  0.0 - 0.1 K/uL  COMPREHENSIVE METABOLIC PANEL     Status: Abnormal   Collection Time    07/27/13  7:13 PM      Result Value Range   Sodium 133 (*) 135 - 145 mEq/L   Potassium 3.6  3.5 - 5.1 mEq/L  Chloride 98  96 - 112 mEq/L   CO2 23  19 - 32 mEq/L   Glucose, Bld 230 (*) 70 - 99 mg/dL   BUN 28 (*) 6 - 23 mg/dL   Creatinine, Ser 1.61  0.50 - 1.10 mg/dL   Calcium 8.5  8.4 - 09.6 mg/dL   Total Protein 6.0  6.0 - 8.3 g/dL   Albumin 2.1 (*) 3.5 - 5.2 g/dL   AST 21  0 - 37 U/L   ALT 26  0 - 35 U/L   Alkaline Phosphatase 391 (*) 39 - 117 U/L   Total Bilirubin 0.6  0.3 - 1.2 mg/dL   GFR calc non Af Amer 53 (*) >90 mL/min   GFR calc Af Amer 61 (*) >90 mL/min   Comment: (NOTE)     The eGFR has been calculated using the CKD EPI equation.     This calculation has not been validated in all clinical situations.     eGFR's persistently <90 mL/min signify possible Chronic Kidney     Disease.  CULTURE, BLOOD (ROUTINE X 2)     Status: None   Collection Time    07/27/13  7:14 PM      Result Value Range   Specimen Description BLOOD RIGHT ANTECUBITAL     Special Requests BOTTLES DRAWN AEROBIC AND ANAEROBIC 12CC EACH     Culture  Setup Time       Value: 07/28/2013 23:07     Performed at [facility]   Culture       Value: GRAM NEGATIVE RODS     Note: WATKINS T. AT 1107 ON 07/28/2013 BY Luetta Nutting M.     Performed at [facility]   Report Status PENDING    CULTURE, BLOOD (ROUTINE X 2)     Status: None   Collection Time    07/27/13  7:19 PM      Result Value Range   Specimen Description BLOOD LEFT HAND     Special Requests BOTTLES DRAWN AEROBIC AND ANAEROBIC 8CC EACH     Culture  Setup Time       Value: 07/28/2013 23:07     Performed  at [facility]   Culture       Value: GRAM NEGATIVE RODS     Note: Gram Stain Report Called to,Read Back By and Verified With: WATKINS T. AT 1107 ON 07/28/2013 BY Luetta Nutting M.     Performed at [facility]   Report Status PENDING    URINALYSIS, ROUTINE W REFLEX MICROSCOPIC     Status: Abnormal   Collection Time    07/27/13  7:47 PM      Result Value Range   Color, Urine YELLOW  YELLOW   APPearance CLEAR  CLEAR   Specific Gravity, Urine 1.015  1.005 - 1.030   pH 6.0  5.0 - 8.0   Glucose, UA 500 (*) NEGATIVE mg/dL   Hgb urine dipstick SMALL (*) NEGATIVE   Bilirubin Urine NEGATIVE  NEGATIVE   Ketones, ur NEGATIVE  NEGATIVE mg/dL   Protein, ur TRACE (*) NEGATIVE mg/dL   Urobilinogen, UA 1.0  0.0 - 1.0 mg/dL   Nitrite NEGATIVE  NEGATIVE   Leukocytes, UA NEGATIVE  NEGATIVE  URINE MICROSCOPIC-ADD ON     Status: Abnormal   Collection Time    07/27/13  7:47 PM      Result Value Range   Squamous Epithelial / LPF FEW (*) RARE   WBC, UA 0-2  <  3 WBC/hpf   RBC / HPF 0-2  <3 RBC/hpf  CLOSTRIDIUM DIFFICILE BY PCR     Status: None   Collection Time    07/28/13 12:35 AM      Result Value Range   C difficile by pcr NEGATIVE  NEGATIVE  STOOL CULTURE     Status: None   Collection Time    07/28/13 12:35 AM      Result Value Range   Specimen Description STOOL     Special Requests NONE     Culture       Value: Culture reincubated for better growth     Performed at [facility]   Report Status PENDING    CBC     Status: Abnormal   Collection Time    07/28/13  5:57 AM      Result Value Range   WBC 11.0 (*) 4.0 - 10.5 K/uL   RBC 2.71 (*) 3.87 - 5.11 MIL/uL   Hemoglobin 8.0 (*) 12.0 - 15.0 g/dL   HCT 16.1 (*) 09.6 - 04.5 %   MCV 87.5  78.0 - 100.0 fL   MCH 29.5  26.0 - 34.0 pg   MCHC 33.8  30.0 - 36.0 g/dL   RDW 40.9 (*) 81.1 - 91.4 %   Platelets 64 (*) 150 - 400 K/uL  COMPREHENSIVE METABOLIC PANEL     Status: Abnormal   Collection Time    07/28/13  5:57 AM       Result Value Range   Sodium 132 (*) 135 - 145 mEq/L   Potassium 3.7  3.5 - 5.1 mEq/L   Chloride 99  96 - 112 mEq/L   CO2 21  19 - 32 mEq/L   Glucose, Bld 274 (*) 70 - 99 mg/dL   BUN 23  6 - 23 mg/dL   Creatinine, Ser 7.82  0.50 - 1.10 mg/dL   Calcium 7.8 (*) 8.4 - 10.5 mg/dL   Total Protein 6.0  6.0 - 8.3 g/dL   Albumin 2.1 (*) 3.5 - 5.2 g/dL   AST 24  0 - 37 U/L   ALT 27  0 - 35 U/L   Alkaline Phosphatase 255 (*) 39 - 117 U/L   Total Bilirubin 0.5  0.3 - 1.2 mg/dL   GFR calc non Af Amer 60 (*) >90 mL/min   GFR calc Af Amer 70 (*) >90 mL/min   Comment: (NOTE)     The eGFR has been calculated using the CKD EPI equation.     This calculation has not been validated in all clinical situations.     eGFR's persistently <90 mL/min signify possible Chronic Kidney     Disease.  DIFFERENTIAL     Status: Abnormal   Collection Time    07/28/13  5:57 AM      Result Value Range   Neutrophils Relative % 45  43 - 77 %   Lymphocytes Relative 4 (*) 12 - 46 %   Monocytes Relative 0 (*) 3 - 12 %   Eosinophils Relative 1  0 - 5 %   Basophils Relative 0  0 - 1 %   Band Neutrophils 43 (*) 0 - 10 %   Metamyelocytes Relative 7     Myelocytes 0     Promyelocytes Absolute 0     Blasts 0     nRBC 0  0 /100 WBC   Neutro Abs 10.5 (*) 1.7 - 7.7 K/uL   Lymphs Abs 0.4 (*) 0.7 -  4.0 K/uL   Monocytes Absolute 0.0 (*) 0.1 - 1.0 K/uL   Eosinophils Absolute 0.1  0.0 - 0.7 K/uL   Basophils Absolute 0.0  0.0 - 0.1 K/uL   RBC Morphology POLYCHROMASIA PRESENT     WBC Morphology WHITE COUNT CONFIRMED ON SMEAR     Comment: ATYPICAL MONONUCLEAR CELLS     VACUOLATED NEUTROPHILS     DOHLE BODIES   Smear Review PLATELET COUNT CONFIRMED BY SMEAR     Comment: PLATELETS APPEAR DECREASED     LARGE PLATELETS PRESENT  VITAMIN B12     Status: Abnormal   Collection Time    07/28/13  5:57 AM      Result Value Range   Vitamin B-12 >2000 (*) 211 - 911 pg/mL   Comment: Performed at [facility]  FOLATE      Status: None   Collection Time    07/28/13  5:57 AM      Result Value Range   Folate 14.5     Comment: (NOTE)     Reference Ranges            Deficient:       0.4 - 3.3 ng/mL            Indeterminate:   3.4 - 5.4 ng/mL            Normal:              > 5.4 ng/mL     Performed at [facility]  IRON AND TIBC     Status: Abnormal   Collection Time    07/28/13  5:57 AM      Result Value Range   Iron <10 (*) 42 - 135 ug/dL   TIBC Not calculated due to Iron <10.  250 - 470 ug/dL   Saturation Ratios Not calculated due to Iron <10.  20 - 55 %   UIBC 152  125 - 400 ug/dL   Comment: Performed at [facility]  FERRITIN     Status: Abnormal   Collection Time    07/28/13  5:57 AM      Result Value Range   Ferritin 3266 (*) 10 - 291 ng/mL   Comment: (NOTE)     Result confirmed by automatic dilution.     Result repeated and verified.     Performed at [facility]  RETICULOCYTES     Status: Abnormal   Collection Time    07/28/13  5:57 AM      Result Value Range   Retic Ct Pct 0.4  0.4 - 3.1 %   RBC. 2.71 (*) 3.87 - 5.11 MIL/uL   Retic Count, Manual 10.8 (*) 19.0 - 186.0 K/uL  CULTURE, BLOOD (ROUTINE X 2)     Status: None   Collection Time    07/28/13  5:57 AM      Result Value Range   Specimen Description BLOOD RIGHT HAND     Special Requests       Value: BOTTLES DRAWN AEROBIC AND ANAEROBIC 6CC  IMMUNE:COMPROMISED   Culture NO GROWTH 1 DAY     Report Status PENDING    CULTURE, BLOOD (ROUTINE X 2)     Status: None   Collection Time    07/28/13  5:57 AM      Result Value Range   Specimen Description BLOOD RIGHT ANTECUBITAL     Special Requests       Value: BOTTLES DRAWN AEROBIC AND ANAEROBIC 6CC  IMMUNE:COMPROMISED   Culture NO GROWTH 1 DAY     Report Status PENDING    AMMONIA     Status: None   Collection Time    07/28/13  6:02 AM      Result Value Range   Ammonia 15  11 - 60 umol/L  GLUCOSE, CAPILLARY     Status: Abnormal   Collection Time     07/28/13  7:35 AM      Result Value Range   Glucose-Capillary 198 (*) 70 - 99 mg/dL  GLUCOSE, CAPILLARY     Status: Abnormal   Collection Time    07/28/13 11:52 AM      Result Value Range   Glucose-Capillary 220 (*) 70 - 99 mg/dL  GLUCOSE, CAPILLARY     Status: Abnormal   Collection Time    07/28/13  5:00 PM      Result Value Range   Glucose-Capillary 170 (*) 70 - 99 mg/dL   Comment 1 Notify RN    GLUCOSE, CAPILLARY     Status: Abnormal   Collection Time    07/28/13  9:04 PM      Result Value Range   Glucose-Capillary 109 (*) 70 - 99 mg/dL  BASIC METABOLIC PANEL     Status: Abnormal   Collection Time    07/29/13  6:17 AM      Result Value Range   Sodium 134 (*) 135 - 145 mEq/L   Potassium 2.9 (*) 3.5 - 5.1 mEq/L   Comment: DELTA CHECK NOTED   Chloride 105  96 - 112 mEq/L   CO2 22  19 - 32 mEq/L   Glucose, Bld 84  70 - 99 mg/dL   BUN 22  6 - 23 mg/dL   Creatinine, Ser 8.41  0.50 - 1.10 mg/dL   Calcium 7.7 (*) 8.4 - 10.5 mg/dL   GFR calc non Af Amer 52 (*) >90 mL/min   GFR calc Af Amer 60 (*) >90 mL/min   Comment: (NOTE)     The eGFR has been calculated using the CKD EPI equation.     This calculation has not been validated in all clinical situations.     eGFR's persistently <90 mL/min signify possible Chronic Kidney     Disease.  HEPATIC FUNCTION PANEL     Status: Abnormal   Collection Time    07/29/13  6:17 AM      Result Value Range   Total Protein 5.3 (*) 6.0 - 8.3 g/dL   Albumin 1.7 (*) 3.5 - 5.2 g/dL   AST 18  0 - 37 U/L   ALT 18  0 - 35 U/L   Alkaline Phosphatase 164 (*) 39 - 117 U/L   Total Bilirubin 0.5  0.3 - 1.2 mg/dL   Bilirubin, Direct 0.2  0.0 - 0.3 mg/dL   Indirect Bilirubin 0.3  0.3 - 0.9 mg/dL  GLUCOSE, CAPILLARY     Status: None   Collection Time    07/29/13  7:30 AM      Result Value Range   Glucose-Capillary 74  70 - 99 mg/dL  GLUCOSE, CAPILLARY     Status: Abnormal   Collection Time    07/29/13 11:30 AM      Result Value Range    Glucose-Capillary 116 (*) 70 - 99 mg/dL      RADIOGRAPHY: Dg Chest 2 View  07/27/2013   *RADIOLOGY REPORT*  Clinical Data: Weakness and fever.  Recent chemotherapy. Pancreatic cancer.  CHEST - 2 VIEW  Comparison: Pleural chest radiograph 05/23/2013  Findings: Heart size is upper normal.  Mediastinal and hilar contours are stable.  Left subclavian power Port-A-Cath is present. Since the chest radiograph of 05/23/2013, the catheter appears to have been slightly retracted, and now curves posteriorly, as best seen on the lateral view, with the distal catheter in the expected location of the azygos vein.  The lungs are normally expanded and clear.  No focal airspace opacity, mass, or pulmonary edema.  Negative for pleural effusion or pneumothorax.  No acute osseous abnormality.  IMPRESSION: 1. The distal tip of the left subclavian Port-A-Cath has changed in position since prior chest radiograph and is in the expected location of the azygos vein.  This finding was called to Dr. Lynelle Doctor 7:40 p.m. 07/27/2013. 2.  No acute cardiopulmonary disease identified.   Original Report Authenticated By: Britta Mccreedy, M.D.   Ct Abdomen Pelvis W Contrast  07/28/2013   *RADIOLOGY REPORT*  Clinical Data: Fever and diarrhea.  CT ABDOMEN AND PELVIS WITH CONTRAST  Technique:  Multidetector CT imaging of the abdomen and pelvis was performed following the standard protocol during bolus administration of intravenous contrast.  Contrast: OMNIPAQUE IOHEXOL 300 MG/ML  SOLN, 50mL OMNIPAQUE IOHEXOL 300 MG/ML  SOLN  Comparison: 05/07/2013.  Findings:  BODY WALL: Unremarkable.  LOWER CHEST:  Mediastinum: Mild cardiomegaly.  Lungs/pleura: No consolidation.  There is a spiculated 1 cm nodule in the medial right base which is slightly smaller than before (prior 12mm), although there is motion.  ABDOMEN/PELVIS:  Liver: Diffuse hepatic metastatic disease.  There is likely a response to treatment: the largest mass, subcapsular anterior right  lobe, now measures 4.5 cm compared to 6 cm previously.  None of these contains new gas to suggest infection.  Biliary: Interval placement of metallic biliary stent.  Stent appears well positioned.  Left-sided ducts are distended, but filled with gas.  Right-sided defects are less dilated than prior. Overall, no evidence for stent obstruction.  Pancreas: Large pancreatic tail mass is again seen, slightly less bulky, but still measuring 7 x 5 cm in axial span.  As before there is direct invasion into the spleen, left upper pole kidney, and left adrenal gland.  Spleen: Direct invasion of the hilum by pancreatic tail mass. There are new wedge shaped areas of hypo enhancement within the peripheral spleen, consistent with infarcts.  Adrenals: Indistinct plane between the left adrenal gland and pancreatic tail mass.  Kidneys and ureters: Ingrowth into the left kidney, as before.  No acute change such as urinary obstruction.  2.5 cm cyst in the left upper pole is again seen.  Bladder: Unremarkable.  Bowel: No obstruction.  No pericecal inflammatory change.  Retroperitoneum: No change in retroperitoneal adenopathy, with left periaortic node at the level of the left renal hilum still measuring 2.7 x 1.6 cm.  Peritoneum: Small-volume ascites, mainly around the liver.  Omental nodules are again seen, more bulky - especially in the supravesicular region.  Reproductive: Unremarkable.  Vascular: No acute abnormality.  OSSEOUS: No acute abnormalities. No suspicious lytic or blastic lesions.  IMPRESSION:  1.  No definite intra-abdominal infection. 2. Widely metastatic pancreatic adenocarcinoma. Right lower lung and hepatic metastatic disease shows modest response to therapy. However, peritoneal metastatic disease shows increasing bulk. 3.  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   Original Report Authenticated By: Tiburcio Pea      ASSESSMENT:  1. Fever of unknown  origin, TMAX24HR 100.9  F.  On Primixin.  Neg blood cultures.  Neg UA.  Neg Chest x-ray. 2. Metastatic pancreatic cancer, D9 of cycle 5 of FOLFIRINOX with Neulasta support.  Images reviewed with radiologist.  Patient has stable disease.  3. Hypokalemia, receiving K+ IV 4. Anemia, dilutional versus reactive.  Will follow along and recommend PRBC transfusion for symptoms at this time. 5. Leukocytosis, secondary to infection versus Neulasta. 6. Shaking chills 7. Diarrhea, negative for C. Diff by PCR.  Will reduce 5FU by 20% for next cycle.   PLAN:  1. CBC diff today and daily.  2. Continue antibiotic regimen. 3. I personally reviewed and went over laboratory results with the patient. 4. CT scan reviewed with Dr. Sharia Reeve (Hem/Onc) and Radiologist.  She is identified to have stable disease with some improvement.  No clear evidence of progression.  5. Continue with supportive care. 6. Will continue to follow patient while in the hospital and will participate in patient care as deemed necessary.   All questions were answered. The patient knows to call the clinic with any problems, questions or concerns. We can certainly see the patient much sooner if necessary.  Patient and plan discussed with Dr. Erline Hau and he is in agreement with the aforementioned.    Benn Tarver

## 2013-07-29 NOTE — Progress Notes (Signed)
Subjective; Patient feels better. She has abdominal soreness but no sharp pain. She had one loose stool in the last 8 hours. It is small volume and still loose. She denies nausea vomiting melena or rectal bleeding.  Objective; BP 108/67  Pulse 89  Temp(Src) 99.7 F (37.6 C) (Oral)  Resp 20  Ht 5\' 6"  (1.676 m)  Wt 164 lb 0.4 oz (74.4 kg)  BMI 26.49 kg/m2  SpO2 98% Abdomen is full. Bowel sounds are normal. On palpation soft abdomen with mild generalized tenderness which is more pronounced in the epigastrium. No organomegaly or masses. No early edema noted.  Lab data; Serum sodium 134, potassium 2.9, chloride 105, CO2 22, glucose 84, BUN 22 and creatinine 1.09 Bilirubin oh 0.5, AP 164, AST 18, ALT 18 and albumin 1.7. Blood cultures remain negative. Stool cultures pending. Stool C. difficile by PCR negative. Abdominopelvic CT from this admission reviewed.  Assessment; #1 Febrile illness associated with diarrhea in a patient who is receiving chemotherapy for stage IV pancreatic adenocarcinoma. Stool C. difficile by PCR is negative but culture pending. Similarly blood cultures are negative. It is possible all of her acute symptoms are secondary to chemotherapy although she did not experience the side effects after first 4 cycles. #2. Patient had biliary wallstent placed on 05/10/2013 for CBD obstruction secondary to pancreatic carcinoma. Bili and transaminases are normal. She has pneumobilia implying patent stent. Recommendations; Will send stool specimen for GI pathogen panel.

## 2013-07-29 NOTE — Progress Notes (Signed)
[id number] 

## 2013-07-30 ENCOUNTER — Other Ambulatory Visit (HOSPITAL_COMMUNITY): Payer: Medicare Other

## 2013-07-30 DIAGNOSIS — E318 Other polyglandular dysfunction: Secondary | ICD-10-CM

## 2013-07-30 DIAGNOSIS — D696 Thrombocytopenia, unspecified: Secondary | ICD-10-CM

## 2013-07-30 DIAGNOSIS — J150 Pneumonia due to Klebsiella pneumoniae: Secondary | ICD-10-CM

## 2013-07-30 LAB — CULTURE, BLOOD (ROUTINE X 2)

## 2013-07-30 LAB — GLUCOSE, CAPILLARY
Glucose-Capillary: 93 mg/dL (ref 70–99)
Glucose-Capillary: 93 mg/dL (ref 70–99)

## 2013-07-30 LAB — CBC
HCT: 22.1 % — ABNORMAL LOW (ref 36.0–46.0)
Hemoglobin: 7.4 g/dL — ABNORMAL LOW (ref 12.0–15.0)
MCHC: 33.5 g/dL (ref 30.0–36.0)
RDW: 17.2 % — ABNORMAL HIGH (ref 11.5–15.5)
WBC: 5 10*3/uL (ref 4.0–10.5)

## 2013-07-30 LAB — HEPATIC FUNCTION PANEL
ALT: 13 U/L (ref 0–35)
Bilirubin, Direct: 0.2 mg/dL (ref 0.0–0.3)
Indirect Bilirubin: 0.2 mg/dL — ABNORMAL LOW (ref 0.3–0.9)
Total Protein: 5.2 g/dL — ABNORMAL LOW (ref 6.0–8.3)

## 2013-07-30 LAB — DIFFERENTIAL
Basophils Absolute: 0 10*3/uL (ref 0.0–0.1)
Basophils Relative: 0 % (ref 0–1)
Lymphocytes Relative: 23 % (ref 12–46)
Monocytes Absolute: 0.6 10*3/uL (ref 0.1–1.0)
Monocytes Relative: 13 % — ABNORMAL HIGH (ref 3–12)
Neutro Abs: 3.2 10*3/uL (ref 1.7–7.7)
Neutrophils Relative %: 63 % (ref 43–77)

## 2013-07-30 LAB — PREPARE RBC (CROSSMATCH)

## 2013-07-30 LAB — GI PATHOGEN PANEL BY PCR, STOOL
Campylobacter by PCR: NEGATIVE
Cryptosporidium by PCR: NEGATIVE
E coli (ETEC) LT/ST: NEGATIVE
G lamblia by PCR: NEGATIVE
Norovirus GI/GII: NEGATIVE

## 2013-07-30 LAB — BASIC METABOLIC PANEL
BUN: 19 mg/dL (ref 6–23)
Calcium: 7.7 mg/dL — ABNORMAL LOW (ref 8.4–10.5)
Creatinine, Ser: 1.01 mg/dL (ref 0.50–1.10)
GFR calc Af Amer: 66 mL/min — ABNORMAL LOW (ref >90)

## 2013-07-30 MED ORDER — SODIUM CHLORIDE 0.9 % IJ SOLN: 3.0000 mL | INTRAMUSCULAR | Status: DC | PRN

## 2013-07-30 MED ORDER — SODIUM CHLORIDE 0.9 % IV SOLN
250.0000 mL | Freq: Once | INTRAVENOUS | Status: AC
  Administered 2013-07-30: 250 mL via INTRAVENOUS

## 2013-07-30 MED ORDER — HEPARIN SOD (PORK) LOCK FLUSH 100 UNIT/ML IV SOLN: 500.0000 [IU] | Freq: Every day | INTRAVENOUS | Status: DC | PRN

## 2013-07-30 MED ORDER — SODIUM CHLORIDE 0.9 % IJ SOLN: 10.0000 mL | INTRAMUSCULAR | Status: DC | PRN

## 2013-07-30 NOTE — Progress Notes (Signed)
NAMEKOREY, ARROYO                ACCOUNT NO.:  [id number]  MEDICAL RECORD NO.:  [id number]  LOCATION:                            [facility]  PHYSICIAN:  Melvyn Novas, MDDATE OF BIRTH:  Dec 05, 1945  DATE OF PROCEDURE: DATE OF DISCHARGE:                                PROGRESS NOTE   The patient has stage IV pancreatic carcinoma, she came with fever and chills, found to have Gram-negative rods growing out of the blood cultures revealed yesterday, currently on imipenem IV, defervescing white count was down from 11 to 6.6, hemoglobin 7.8, and stable. Potassium was 2.9 yesterday.  We will await today's results.  Patient eating much better on lactose reduced diet, less gas cramps and so forth has good appetite.  LABORATORY DATA:  Liver function tests appeared normal.  Ammonia appears normal at 15.  PHYSICAL EXAMINATION:  LUNGS:  Clear.  No rales, wheeze, or rhonchi. HEART:  Regular rhythm.  No murmurs, gallops, or rubs. ABDOMEN:  Soft, nontender.  Bowel sounds normoactive.  The plan right now is to continue lactose-free diet, imipenem for another 48 hours, observe clinical response, monitor electrolytes, anemia, and supportive consult from Hematology and GI appreciated.     Melvyn Novas, MD     RMD/MEDQ  D:  07/30/2013  T:  07/30/2013  Job:  [id number]

## 2013-07-30 NOTE — Progress Notes (Signed)
[id number] 

## 2013-07-30 NOTE — Consult Note (Signed)
I saw patient with Tina Griffiths, I also interviewed and examined the patient. I do agree with the above [place] and Plan.

## 2013-07-30 NOTE — Progress Notes (Signed)
NAMEADABELLE, [name]                ACCOUNT NO.:  [id number]  MEDICAL RECORD NO.:  [id number]  LOCATION:                                 FACILITY:  PHYSICIAN:  Melvyn Novas, MDDATE OF BIRTH:  1946/09/13  DATE OF PROCEDURE:  07/29/2013 DATE OF DISCHARGE:                                PROGRESS NOTE   The patient has stage IV pancreatic carcinoma, biliary stent placed previously by Dr. Karilyn Cota.  She has multiple hepatic masses, now small than previous with extension into the spleen by direct invasion.  The patient admitted with fever and chills, thought to have occult infection, placed on imipenem empirically.  Blood cultures thus far aerobic and anaerobic bacteria are not revealing.  Stool for C. difficile is negative by PCR and urine collections likewise unrevealing. The patient currently afebrile.  The patient had abdominal cramping as well as diarrhea and flatulence after diet.  She has lactose intolerance.  We will reduce her lactose and diet, see if this improves. Potassium is 2.9, being rectified intravenously and orally.  PHYSICAL EXAMINATION:  VITAL SIGNS:  Blood pressure 108/67, temperature 99.7, pulse is 89 and regular, respiratory rate is 20. LUNGS:  Clear to A and P.  No rales, wheeze, or rhonchi. GENERAL:  The patient alert and oriented.  Good spirits.  Good appetite. HEART:  Regular rhythm.  No murmurs, gallops, heaves, thrills, or rubs. ABDOMEN:  Soft, nontender.  Bowel sounds normoactive.  No guarding, rebound, mass, or megaly.  PLAN:  Right now is to change diet to lactose-restricted.  Supplement potassium 4 mEq runs 10 mEq x4.  Monitor BMET and liver profile and CBC in a.m.  Appreciate Gastroenterology input and awaiting Hematology consultation.     Melvyn Novas, MD     RMD/MEDQ  D:  07/29/2013  T:  07/29/2013  Job:  [id number]

## 2013-07-30 NOTE — Progress Notes (Signed)
I saw patient with Elijah Birk and we discussed the assessment and plan together . I agree with the above.  We recommend a minimum of 14 days of antibiotic given the patient has a port with potential for bacterial seeding.

## 2013-07-30 NOTE — Progress Notes (Signed)
Inpatient Diabetes Program Recommendations  AACE/ADA: New Consensus Statement on Inpatient Glycemic Control (2013)  Target Ranges:  Prepandial:   less than 140 mg/dL      Peak postprandial:   less than 180 mg/dL (1-2 hours)      Critically ill patients:  140 - 180 mg/dL   Results for SUN, KIHN (MRN [id number]) as of 07/30/2013 07:56  Ref. Range 07/29/2013 07:30 07/29/2013 11:30 07/29/2013 16:12 07/29/2013 21:42 07/30/2013 07:43  Glucose-Capillary Latest Range: 70-99 mg/dL 74 409 (H) 91 72 93   Results for [name], [name] (MRN [id number]) as of 07/30/2013 07:56  Ref. Range 07/30/2013 05:54  Glucose Latest Range: 70-99 mg/dL 65 (L)   Inpatient Diabetes Program Recommendations Insulin - Basal: Please consider decreasing Lantus to 19 units QHS.   Note: Blood glucose has ranged from 65-116 mg/dl over the past 24 hours.  Please consider decreasing Lantus to 19 units QHS to prevent hypoglycemia while inpatient.  Will continue to follow.  Thanks, Orlando Penner, RN, MSN, CCRN Diabetes Coordinator Inpatient Diabetes Program [phone]

## 2013-07-30 NOTE — Progress Notes (Signed)
Subjective: She is seen in bed resting.  Easily aroused.   She denies any shaking chills.  She denies any fevers.  Her abdomen remains mildly tender, but improved.   I personally reviewed and went over laboratory results with the patient.  Her blood cultures from 07/27/2013 grew Klebseilla pneumoniae.    Objective: Vital signs in last 24 hours: Temp:  [98.5 F (36.9 C)-99.4 F (37.4 C)] 99.4 F (37.4 C) (09/03 1459) Pulse Rate:  [90-99] 99 (09/03 1459) Resp:  [20] 20 (09/03 1459) BP: (111-130)/(60-76) 127/70 mmHg (09/03 1459) SpO2:  [99 %-100 %] 100 % (09/03 1459) Weight:  [169 lb 12.1 oz (77 kg)] 169 lb 12.1 oz (77 kg) (09/03 0545)  Intake/Output from previous day: 09/02 0800 - 09/03 0759 In: 2180 [P.O.:480; I.V.:1200; IV Piggyback:500] Out: -  Intake/Output this shift: Total I/O In: 360 [P.O.:360] Out: -   General appearance: alert, cooperative and no distress Cardiac: RRR without murmur Lungs: CTA without wheezes, rales, of rhonchi Extremities: No edema Skin: Warm and dry Neuro: A and O x 3.  Lab Results:   Recent Labs  07/29/13 0617 07/30/13 0554  WBC 6.6 5.0  HGB 7.8* 7.4*  HCT 23.4* 22.1*  PLT 74* 81*   BMET  Recent Labs  07/29/13 0617 07/30/13 0554  NA 134* 136  K 2.9* 3.3*  CL 105 106  CO2 22 20  GLUCOSE 84 65*  BUN 22 19  CREATININE 1.09 1.01  CALCIUM 7.7* 7.7*    Studies/Results: No results found.  Medications: I have reviewed the patient's current medications.  Assessment/Plan: 1. Kleibsiella pneumoniae bacteremia, sensitive to Primaxin which she is on.  At time of discharge, recommend PO antibiotics per sensitivity report for a minimum of 14 days.   2. Metastatic pancreatic cancer, D10 of cycle 5 of FOLFIRINOX with Neulasta support. Images reviewed with radiologist. Patient has stable disease.  3. Anemia, worse.  Down to 7.4 g/dL.  Will order 1 unit PRBC.  Reactive to bacteremia with slow recovery secondary to chemotherapy. 4.  Thrombocytopenia, stable and improving.  Reactive to bacteremia. 5. Hypokalemia, defer repletion to attending. 6. Will defer chemotherapy x 7 days. 7. Will continue to follow as an inpatient.  Patient and plan discussed with Dr. Erline Hau and he is in agreement with the aforementioned.     LOS: 3 days    Rawleigh Rode 07/30/2013

## 2013-07-30 NOTE — Progress Notes (Addendum)
Patient ID: [name], female   DOB: [birth year], 67 y.o.   MRN: [id number] Says she feels better today. C/o bloating. Bloating relieved with having a BM. No BM today. Two small losse BMs yesterday. C-diff negative. Stool for occult blood is pending as well as GI pathogen. Hemoglobin today is 7.4, K. 3.3, NA 136. No fever today. Filed Vitals:   07/29/13 0544 07/29/13 1401 07/29/13 2140 07/30/13 0545  BP: 108/67 122/76 111/60 130/76  Pulse: 89 99 99 90  Temp: 99.7 F (37.6 C) 99.4 F (37.4 C) 98.5 F (36.9 C) 98.5 F (36.9 C)  TempSrc: Oral Oral Oral Oral  Resp: 20 19 20 20   Height:      Weight:    169 lb 12.1 oz (77 kg)  SpO2: 98% 99% 99% 100%   CBC    Component Value Date/Time   WBC 5.0 07/30/2013 0554   RBC 2.55* 07/30/2013 0554   RBC 2.71* 07/28/2013 0557   HGB 7.4* 07/30/2013 0554   HCT 22.1* 07/30/2013 0554   PLT 81* 07/30/2013 0554   MCV 86.7 07/30/2013 0554   MCH 29.0 07/30/2013 0554   MCHC 33.5 07/30/2013 0554   RDW 17.2* 07/30/2013 0554   LYMPHSABS 1.1 07/30/2013 0554   MONOABS 0.6 07/30/2013 0554   EOSABS 0.1 07/30/2013 0554   BASOSABS 0.0 07/30/2013 0554     Abdomen is soft and full. BS are positive. Tenderness epigastric region, rt upper quadrant and left upper quadrant. Assessment: #1 Febrile illness associated with diarrhea in a patient who is receiving chemotherapy for stage IV pancreatic adenocarcinoma. Stool C. difficile negative.    Plan: GI pathogen pending. Occult blood pending.  GI attending note; Patient feels better. Her abdominal soreness is now minimal. She had 2 bowel movements today and for stool was formed. Her appetite is good. Blood cultures positive for Klebsiella pneumoniae. She is responding to Imipenem. There is no evidence suggest that she has cholangitis as the source of bacteremia. Bilirubin and transaminases are normal and alkaline phosphatase knee are normal. Acute on chronic anemia and she is receiving PRBCs.

## 2013-07-31 ENCOUNTER — Other Ambulatory Visit (HOSPITAL_COMMUNITY): Payer: Self-pay | Admitting: Oncology

## 2013-07-31 DIAGNOSIS — R109 Unspecified abdominal pain: Secondary | ICD-10-CM

## 2013-07-31 LAB — GLUCOSE, CAPILLARY
Glucose-Capillary: 121 mg/dL — ABNORMAL HIGH (ref 70–99)
Glucose-Capillary: 150 mg/dL — ABNORMAL HIGH (ref 70–99)

## 2013-07-31 LAB — CBC WITH DIFFERENTIAL/PLATELET
Basophils Absolute: 0.1 10*3/uL (ref 0.0–0.1)
Basophils Relative: 1 % (ref 0–1)
Eosinophils Absolute: 0.1 10*3/uL (ref 0.0–0.7)
HCT: 31.4 % — ABNORMAL LOW (ref 36.0–46.0)
Lymphocytes Relative: 21 % (ref 12–46)
MCH: 29.8 pg (ref 26.0–34.0)
MCHC: 34.4 g/dL (ref 30.0–36.0)
Monocytes Absolute: 1.3 10*3/uL — ABNORMAL HIGH (ref 0.1–1.0)
Neutro Abs: 4.3 10*3/uL (ref 1.7–7.7)
RDW: 16.5 % — ABNORMAL HIGH (ref 11.5–15.5)

## 2013-07-31 LAB — BASIC METABOLIC PANEL
CO2: 21 mEq/L (ref 19–32)
GFR calc non Af Amer: 55 mL/min — ABNORMAL LOW (ref >90)
Glucose, Bld: 107 mg/dL — ABNORMAL HIGH (ref 70–99)
Potassium: 3 mEq/L — ABNORMAL LOW (ref 3.5–5.1)
Sodium: 138 mEq/L (ref 135–145)

## 2013-07-31 LAB — TYPE AND SCREEN: Unit division: 0

## 2013-07-31 MED ORDER — POTASSIUM CHLORIDE 10 MEQ/100ML IV SOLN
10.0000 meq | INTRAVENOUS | Status: AC
  Administered 2013-07-31 (×3): 10 meq via INTRAVENOUS
  Filled 2013-07-31 (×3): qty 100

## 2013-07-31 MED ORDER — POTASSIUM CHLORIDE CRYS ER 20 MEQ PO TBCR
20.0000 meq | EXTENDED_RELEASE_TABLET | Freq: Two times a day (BID) | ORAL | Status: DC
  Administered 2013-07-31 – 2013-08-02 (×5): 20 meq via ORAL
  Filled 2013-07-31 (×5): qty 1

## 2013-07-31 NOTE — Progress Notes (Addendum)
I discussed assessment and plan with Dellis Anes, PA-C and I agree with the above.

## 2013-07-31 NOTE — Progress Notes (Signed)
Pt c/o bloating in abdomen all day. Pt has had a total of 4 BM's today.  States no flatus.  Pt encouraged to ambulate in hallway.  Pt ambulated approximately 500 feet, independently, with no relief of bloating.  Pt states she spoke with Hailey in [facility] and [place], [place] would be down to see patient soon.  Will continue to monitor.

## 2013-07-31 NOTE — Progress Notes (Signed)
Subjective: Patient's only complaint is abdominal pain and increased bloating.  She reports that she has had about 4 BMs today.  She denies any fevers or shaking chills.  TMAX24Hr of 99.4 F.  She received her 1 unit of PRBCs today.  She reports that the abdominal bloating began around that time.  She denies any back pain, increased SOB, or rashes.  Objective: Vital signs in last 24 hours: Temp:  [98.4 F (36.9 C)-99.3 F (37.4 C)] 98.6 F (37 C) (09/04 1459) Pulse Rate:  [83-91] 90 (09/04 1459) Resp:  [18-20] 18 (09/04 1459) BP: (121-135)/(65-83) 127/78 mmHg (09/04 1459) SpO2:  [96 %-100 %] 100 % (09/04 1459) Weight:  [181 lb 14.1 oz (82.5 kg)] 181 lb 14.1 oz (82.5 kg) (09/04 0520)  Intake/Output from previous day: 09/03 0800 - 09/04 0759 In: 5478.3 [P.O.:480; I.V.:3888.3; Blood:710; IV Piggyback:400] Out: -  Intake/Output this shift: Total I/O In: 240 [P.O.:240] Out: -   General appearance: alert, cooperative and mild distress GI: abnormal findings:  ascites and distended  Lab Results:   Recent Labs  07/29/13 0617 07/30/13 0554  WBC 6.6 5.0  HGB 7.8* 7.4*  HCT 23.4* 22.1*  PLT 74* 81*   BMET  Recent Labs  07/30/13 0554 07/31/13 0540  NA 136 138  K 3.3* 3.0*  CL 106 110  CO2 20 21  GLUCOSE 65* 107*  BUN 19 15  CREATININE 1.01 1.05  CALCIUM 7.7* 7.9*    Studies/Results: No results found.  Medications: I have reviewed the patient's current medications.  Assessment/Plan: 1. Kleibsiella pneumoniae bacteremia, sensitive to Primaxin which she is on. At time of discharge, recommend PO antibiotics per sensitivity report for a minimum of 14 days.  2. Metastatic pancreatic cancer, D11 of cycle 5 of FOLFIRINOX with Neulasta support. Images reviewed with radiologist. Patient has stable disease.  Next cycle of chemotherapy deferred x 7 days. 3. Anemia, S/P 1 unit PRBC.  Reactive to bacteremia with slow recovery secondary to chemotherapy.  4. Thrombocytopenia,  stable and improving. Reactive to bacteremia.  5. Hypokalemia, defer repletion to attending.  6. Abdominal distension.  Ordered CBC diff today.  Ordered US of abdomen with therapeutic paracentesis for tomorrow.  Will send for gram stain, cell count, and culture to verify it is not infected fluid. 7. Will continue to follow as an inpatient. Outpatient follow-up and chemotherapy documented in discharge navigator and scheduled appropriately at this time.    Patient and plan discussed with Dr. Erline Hau and he is in agreement with the aforementioned.   LOS: 4 days    Anneta Rounds 07/31/2013

## 2013-07-31 NOTE — Progress Notes (Signed)
NAMEEMMOGENE, SIMSON                ACCOUNT NO.:  [id number]  MEDICAL RECORD NO.:  [id number]  LOCATION:  A337                          FACILITY:  [facility]  PHYSICIAN:  Melvyn Novas, MDDATE OF BIRTH:  1946-10-23  DATE OF PROCEDURE: DATE OF DISCHARGE:                                PROGRESS NOTE   The patient has stage IV pancreatic carcinoma, has fever with blood cultures positive x2 for gram-negative rods.  Currently on imipenem IV since admission with defervescence.  Subsequent blood cultures are negative x2.  She has significant anemia, hemoglobin 7.3, hypokalemia 3.0 this morning.  Good appetite.  Blood pressure is 133/83, temperature 98.4, pulse 83 and regular, respiratory rate is 20.  Hemoglobin was 7.4. Likewise, she is an insulin-dependent diabetic with good glycemic control and decent appetite.  Lungs are clear.  No rales, wheezes, or rhonchi. HEART:  Regular rhythm.  No murmurs, gallops, or rubs.  Abdomen is soft, nontender.  Bowel sounds normoactive.  PLAN:  Right now, she is to continue imipenem 500 IV every 8 hours, give KCl 20 mEq p.o. b.i.d. today.  Monitor CBC in a.m. as well as potassium.     Melvyn Novas, MD     RMD/MEDQ  D:  07/31/2013  T:  07/31/2013  Job:  [id number]

## 2013-08-01 ENCOUNTER — Ambulatory Visit (HOSPITAL_COMMUNITY): Admission: RE | Admit: 2013-08-01 | Payer: Medicare Other | Source: Ambulatory Visit

## 2013-08-01 ENCOUNTER — Encounter (HOSPITAL_COMMUNITY): Payer: Self-pay

## 2013-08-01 ENCOUNTER — Inpatient Hospital Stay (HOSPITAL_COMMUNITY): Payer: Medicare Other

## 2013-08-01 ENCOUNTER — Ambulatory Visit (HOSPITAL_COMMUNITY): Payer: Medicare Other

## 2013-08-01 DIAGNOSIS — R188 Other ascites: Secondary | ICD-10-CM

## 2013-08-01 LAB — CBC WITH DIFFERENTIAL/PLATELET
Basophils Absolute: 0.1 K/uL (ref 0.0–0.1)
Basophils Relative: 1 % (ref 0–1)
Eosinophils Absolute: 0.2 K/uL (ref 0.0–0.7)
Eosinophils Relative: 2 % (ref 0–5)
HCT: 28.3 % — ABNORMAL LOW (ref 36.0–46.0)
Hemoglobin: 9.5 g/dL — ABNORMAL LOW (ref 12.0–15.0)
Lymphocytes Relative: 15 % (ref 12–46)
Lymphs Abs: 1.2 K/uL (ref 0.7–4.0)
MCH: 29.1 pg (ref 26.0–34.0)
MCHC: 33.6 g/dL (ref 30.0–36.0)
MCV: 86.5 fL (ref 78.0–100.0)
Monocytes Absolute: 1.3 K/uL — ABNORMAL HIGH (ref 0.1–1.0)
Monocytes Relative: 16 % — ABNORMAL HIGH (ref 3–12)
Neutro Abs: 5.5 K/uL (ref 1.7–7.7)
Neutrophils Relative %: 67 % (ref 43–77)
Platelets: 140 K/uL — ABNORMAL LOW (ref 150–400)
RBC: 3.27 MIL/uL — ABNORMAL LOW (ref 3.87–5.11)
RDW: 16.6 % — ABNORMAL HIGH (ref 11.5–15.5)
WBC Morphology: INCREASED
WBC: 8.2 K/uL (ref 4.0–10.5)

## 2013-08-01 LAB — BASIC METABOLIC PANEL
BUN: 11 mg/dL (ref 6–23)
Calcium: 8.1 mg/dL — ABNORMAL LOW (ref 8.4–10.5)
Creatinine, Ser: 1 mg/dL (ref 0.50–1.10)
GFR calc Af Amer: 67 mL/min — ABNORMAL LOW (ref >90)
GFR calc non Af Amer: 58 mL/min — ABNORMAL LOW (ref >90)
Glucose, Bld: 145 mg/dL — ABNORMAL HIGH (ref 70–99)
Potassium: 3.5 mEq/L (ref 3.5–5.1)

## 2013-08-01 LAB — BODY FLUID CELL COUNT WITH DIFFERENTIAL: Lymphs, Fluid: 2 %

## 2013-08-01 LAB — STOOL CULTURE

## 2013-08-01 LAB — GLUCOSE, CAPILLARY
Glucose-Capillary: 134 mg/dL — ABNORMAL HIGH (ref 70–99)
Glucose-Capillary: 134 mg/dL — ABNORMAL HIGH (ref 70–99)

## 2013-08-01 NOTE — Progress Notes (Signed)
Paracentesis complete no signs of distress. brown abdominal fluid removed

## 2013-08-01 NOTE — Progress Notes (Signed)
Subjective; Patient complains of poor appetite and abdominal distention. She is scheduled for abdominal paracenteses. She denies nausea vomiting fever or chills. She is passing formed stools. She denies melena or rectal bleeding. Objective; BP 129/75  Pulse 84  Temp(Src) 98.8 F (37.1 C) (Oral)  Resp 20  Ht 5\' 6"  (1.676 m)  Wt 182 lb 8.7 oz (82.8 kg)  BMI 29.48 kg/m2  SpO2 99% Patient is alert and in no acute distress. Abdomen is distended but not tense. Lab data; GI pathogen panel is negative. WBC 8.2, H&H 9.5 and 28.3 and platelet count 140K Serum sodium 138, potassium 3.5, chloride 110, CO2 20, glucose 145, BUN 11 and creatinine 1.0 Klebsiella pneumoniae in blood cultures resistant to ampicillin but sensitive to other agents including Cipro. Assessment; Diarrhea has resolved. All stool studies are negative. Ascites secondary to stage IV pancreatic adenocarcinoma. Patient scheduled to undergo abdominal paracenteses as recommended by oncologist.  Klebsiella pneumoniae sepsis. Biliary source ruled out as prominent since biliary stent is wide-open and bilirubin and transaminases are normal.  Acute on chronic anemia. No evidence of GI blood loss. Patient has received 2 units of PRBCs. Recommendations; She will need 2 more weeks of by mouth antibiotics on discharge. Will sign off; call if you have any questions.

## 2013-08-01 NOTE — Progress Notes (Addendum)
Notified Dr. Janna Arch about the patients blood culture coming back  With GRAM NEGATIVE RODS, which is what the previous culture was.  I voiced to him we had discussed in progression about whether the source of infection was coming from her portacath.  I voiced to him that I knew that the patient had been getting chemo for at least 5 weeks for stage 4 cancer but I was unsure of how long the port had been placed.   He stated to consult general surgery for a possible sepsis related  To her portacath.     1315 Notified Dr. Lovell Sheehan of the consult background information given.  He asked me to have Dr. Janna Arch to call him at [phone].  I paged Dr. Janna Arch and put his number in the pager for call back. Plus I paged him to confirm that the page was received.   1340 Spoke with Dr. Janna Arch and gave Dr. Lovell Sheehan number to him so that he could call him.  Dr. Lovell Sheehan had questions about rather or not Dr. Janna Arch wanted to portacath removed or not.  1349 Spoke again to Dr. Lovell Sheehan and he stated that after talking with Dr. Janna Arch that the patient seeme to be improving on the current treatment and that we are going to hold off on removing the portacath for now.  He states that he will be here tomorrow to see the patient, and that for now we can continue to use the portacath.

## 2013-08-01 NOTE — Progress Notes (Signed)
[id number] 

## 2013-08-01 NOTE — Progress Notes (Signed)
I saw and examined patient. We discussed assessment and plan together and I agree with the above.

## 2013-08-01 NOTE — Progress Notes (Signed)
[name], [name] NO.:  [id number]  MEDICAL RECORD NO.:  [id number]  LOCATION:  A337                          FACILITY:  [facility]  PHYSICIAN:  Melvyn Novas, MDDATE OF BIRTH:  10-31-1946  DATE OF PROCEDURE: DATE OF DISCHARGE:                                PROGRESS NOTE   67 year old black female with stage IV pancreatic carcinoma, diffuse metastatic disease, including spleen.  Blood cultures positive for Klebsiella pneumonia, febrile illness in the face of diarrhea, increasing ascites presumably due to underlying illness as well as IV fluids.  Currently on imipenem and responding clinically and laboratory wise to bacteremia.  Patient has abdominal bloating, somewhat improved with lactose reduced diet.  Lungs show diminished breath sounds in the bases.  No rales, wheeze, or rhonchi appreciable.  Heart, regular rhythm.  No S3, S4 appreciable.  No heaves, thrills, or rubs.  Abdomen distended.  Positive fluid wave.  No guarding, no rebound, no mass or megaly.  Blood pressure 129/75, temperature 98.8, pulse 84 and regular, respiratory rate is 20, hemoglobin drifted down from 10.8 to 9.5, could be multifactorial, possibly delusional, potassium 3.5 this morning after repletion orally and intravenously.  The patient appears hemodynamically stable.  Likewise good glycemic control on Lantus 20 units at bedtime daily.  Plan right now is to continue KCl 20 p.o. b.i.d.  Monitor electrolytes as well as hemoglobin and hematocrit. Patient is scheduled for abdominal paracentesis.  I believe, we will continue current therapy.     Melvyn Novas, MD     RMD/MEDQ  D:  08/01/2013  T:  08/01/2013  Job:  [id number]

## 2013-08-01 NOTE — Plan of Care (Signed)
Discussed plan of care with Maisie Fus and the Oncologists about the patients care.  They say that we should not take out the portacath but to leave it in.  They state that the septic infection is most likely from another source sine the infection usually found at the port is staph.  Oncology states the patient should not go home until the blood cultures are negative and that the cultures will be taken daily.  Maisie Fus states that he would notify Dr. Lovell Sheehan about the situation.

## 2013-08-01 NOTE — Progress Notes (Addendum)
Subjective: Patient reports that she feels much better following paracentesis which removed 2700 cc of yellow fluid.  It was sent for gram staining and culture to rule out infection.  Afebrile and patient denies shaking chills.     Objective: Vital signs in last 24 hours: Temp:  [98.2 F (36.8 C)-98.8 F (37.1 C)] 98.7 F (37.1 C) (09/05 1345) Pulse Rate:  [84-90] 84 (09/05 1345) Resp:  [16-20] 16 (09/05 1345) BP: (112-145)/(57-82) 145/82 mmHg (09/05 1345) SpO2:  [98 %-99 %] 98 % (09/05 1345) Weight:  [182 lb 8.7 oz (82.8 kg)] 182 lb 8.7 oz (82.8 kg) (09/05 0300)  Intake/Output from previous day: 09/04 0800 - 09/05 0759 In: 360 [P.O.:360] Out: -  Intake/Output this shift:    General appearance: alert, cooperative, appears stated age and no distress Resp: clear to auscultation bilaterally Cardio: regular rate and rhythm, S1, S2 normal, no murmur, click, rub or gallop GI: soft, non-tender; bowel sounds normal; no masses,  no organomegaly Port: Intact without any sign of infection.  No erythema.  Lab Results:   Recent Labs  07/31/13 1727 08/01/13 0444  WBC 7.4 8.2  HGB 10.8* 9.5*  HCT 31.4* 28.3*  PLT 140* 140*   BMET  Recent Labs  07/31/13 0540 08/01/13 0444  NA 138 138  K 3.0* 3.5  CL 110 110  CO2 21 20  GLUCOSE 107* 145*  BUN 15 11  CREATININE 1.05 1.00  CALCIUM 7.9* 8.1*    Studies/Results: US Abdomen Limited  08/01/2013   *RADIOLOGY REPORT*  Clinical Data: Evaluate for ascites  LIMITED ABDOMEN ULTRASOUND FOR ASCITES  Technique: Scanning over all four quadrants of the abdomen was performed in  Comparison:  None.  Findings: Moderate ascites is present in all four quadrants of the abdomen.  IMPRESSION: Moderate ascites.   Original Report Authenticated By: Jolaine Click, M.D.   US Paracentesis  08/01/2013   CLINICAL DATA:  Metastatic pancreatic cancer. Ascites.  EXAM: ULTRASOUND GUIDED PARACENTESIS  COMPARISON:  None.  FINDINGS: A total of approximately 2.7 L  of clear yellow fluid was removed. A fluid sample was sent for laboratory analysis.  IMPRESSION: Successful ultrasound guided paracentesis yielding 2.7 L of ascites.   Electronically Signed   By: Charlett Nose M.D.   On: 08/01/2013 15:24    Medications: I have reviewed the patient's current medications.  Assessment/Plan: 1. Kleibsiella pneumoniae bacteremia, sensitive to Primaxin which she is on. At time of discharge, recommend PO antibiotics per sensitivity report for a minimum of 14 days. Do not recommend port removal since patient has been afebrile since 07/28/2013 and is clinically improving.  Additionally, the port is highly unlikely to be the source of this this gram negative infection, but we need to be observant for infection seeding of the catheter.  Source of infection is more likely urinary or GI as this has been identified as Klebsiella bacteremia.   If infection does not clear or recurs, then may consider port removal.  Repeat blood cultures today.  Blood cultures repeated daily until negative.  Orders placed.  Recommend patient remain in the hospital until cultures are negative since this is a gram negative bacteremia.  Discussed Hem/Onc recommendations with Dr. Lovell Sheehan on 08/01/2013. 2. Metastatic pancreatic cancer, D12 of cycle 5 of FOLFIRINOX with Neulasta support. Images reviewed with radiologist. Patient has stable disease per CT scan.  3. Anemia, S/P 1 unit PRBC on 07/30/2013. Reactive to bacteremia with slow recovery secondary to chemotherapy.  4. Thrombocytopenia, stable and improving. Reactive  to bacteremia.  5. Hypokalemia, resolved.   6. Recurrent ascites.  Will consider switching therapy to Gemcitabine/Abraxane as this recurrent ascites may be indicative of progression. S/P US guided paracentesis on 07/31/2013 with removal of 2700 cc of yellow fluid.  7. Will continue to follow as an inpatient. Outpatient follow-up and chemotherapy documented in discharge navigator and scheduled  appropriately at this time, however, this may need to be changed depending on hospital course.   Patient and plan discussed with Dr. Erline Hau and he is in agreement with the aforementioned.      LOS: 5 days    KEFALAS,THOMAS 08/01/2013

## 2013-08-01 NOTE — Progress Notes (Signed)
Patient request that we not do hour rounding on her tonight so she can sleep.

## 2013-08-02 LAB — BASIC METABOLIC PANEL
BUN: 8 mg/dL (ref 6–23)
Creatinine, Ser: 1 mg/dL (ref 0.50–1.10)
GFR calc non Af Amer: 58 mL/min — ABNORMAL LOW (ref >90)
Glucose, Bld: 74 mg/dL (ref 70–99)
Potassium: 3.2 mEq/L — ABNORMAL LOW (ref 3.5–5.1)

## 2013-08-02 LAB — CBC WITH DIFFERENTIAL/PLATELET
Basophils Absolute: 0.1 10*3/uL (ref 0.0–0.1)
Basophils Relative: 1 % (ref 0–1)
Eosinophils Absolute: 0.1 10*3/uL (ref 0.0–0.7)
HCT: 35.7 % — ABNORMAL LOW (ref 36.0–46.0)
Hemoglobin: 12.7 g/dL (ref 12.0–15.0)
MCH: 30.9 pg (ref 26.0–34.0)
MCHC: 35.6 g/dL (ref 30.0–36.0)
Monocytes Absolute: 1.7 10*3/uL — ABNORMAL HIGH (ref 0.1–1.0)
Monocytes Relative: 10 % (ref 3–12)
Neutrophils Relative %: 74 % (ref 43–77)
RDW: 16.9 % — ABNORMAL HIGH (ref 11.5–15.5)

## 2013-08-02 LAB — GLUCOSE, CAPILLARY
Glucose-Capillary: 75 mg/dL (ref 70–99)
Glucose-Capillary: 91 mg/dL (ref 70–99)
Glucose-Capillary: 64 mg/dL — ABNORMAL LOW (ref 70–99)
Glucose-Capillary: 84 mg/dL (ref 70–99)

## 2013-08-02 MED ORDER — POTASSIUM CHLORIDE CRYS ER 20 MEQ PO TBCR
20.0000 meq | EXTENDED_RELEASE_TABLET | Freq: Three times a day (TID) | ORAL | Status: DC
  Administered 2013-08-02 – 2013-08-07 (×12): 20 meq via ORAL
  Filled 2013-08-02 (×13): qty 1

## 2013-08-02 NOTE — Progress Notes (Signed)
[id number] 

## 2013-08-03 LAB — CBC WITH DIFFERENTIAL/PLATELET
Basophils Relative: 1 % (ref 0–1)
Eosinophils Absolute: 0.2 10*3/uL (ref 0.0–0.7)
HCT: 30.8 % — ABNORMAL LOW (ref 36.0–46.0)
Hemoglobin: 10.3 g/dL — ABNORMAL LOW (ref 12.0–15.0)
Lymphocytes Relative: 11 % — ABNORMAL LOW (ref 12–46)
Lymphs Abs: 2.5 10*3/uL (ref 0.7–4.0)
MCHC: 33.4 g/dL (ref 30.0–36.0)
MCV: 87.3 fL (ref 78.0–100.0)
Monocytes Relative: 8 % (ref 3–12)
Neutro Abs: 18.1 10*3/uL — ABNORMAL HIGH (ref 1.7–7.7)

## 2013-08-03 LAB — BASIC METABOLIC PANEL
BUN: 7 mg/dL (ref 6–23)
CO2: 21 mEq/L (ref 19–32)
Chloride: 113 mEq/L — ABNORMAL HIGH (ref 96–112)
GFR calc Af Amer: 77 mL/min — ABNORMAL LOW (ref >90)
Potassium: 3.7 mEq/L (ref 3.5–5.1)

## 2013-08-03 LAB — GLUCOSE, CAPILLARY: Glucose-Capillary: 122 mg/dL — ABNORMAL HIGH (ref 70–99)

## 2013-08-03 NOTE — Progress Notes (Signed)
[id number] 

## 2013-08-03 NOTE — Plan of Care (Signed)
Problem: Phase III Progression Outcomes Goal: Discharge plan remains appropriate-arrangements made Explained to pt what docs are looking for to be able to DC in couple of days

## 2013-08-03 NOTE — Progress Notes (Signed)
[name], [name]                ACCOUNT NO.:  [id number]  MEDICAL RECORD NO.:  [id number]  LOCATION:  A337                          FACILITY:  [facility]  PHYSICIAN:  Melvyn Novas, MDDATE OF BIRTH:  1946/05/18  DATE OF PROCEDURE: DATE OF DISCHARGE:                                PROGRESS NOTE   The patient has stage IV pancreatic carcinoma, diffuse metastatic disease.  She had fever, etiology was undetermined, responsive to imipenem, grew Klebsiella pneumonia in blood.  The patient is afebrile for the last 3 days, feeling better.  Blood pressure 121/79, pulse 95 and regular, respiratory rate is 20.  Consideration given to indwelling catheter as being a possible source of infection.  Discussed this with Surgery.  We are observing her.  Clinically, she is responding.  White count went up from 8 to 17.1 between yesterday and today.  Potassium dipped to 3.2.  Lungs are clear.  No rales, wheeze, or rhonchi.  Heart, regular rhythm.  No murmurs, gallops, heaves, thrills, or rubs.  Her diarrhea has resolved.  She has significant ascites, acute on chronic anemia.  PLAN:  Right now is to continue imipenem, observe leukocytosis and __________ clinical response to infection and consider switching to oral antibiotics if clinical response is positive.     Melvyn Novas, MD     RMD/MEDQ  D:  08/02/2013  T:  08/02/2013  Job:  [id number]

## 2013-08-04 ENCOUNTER — Inpatient Hospital Stay (HOSPITAL_COMMUNITY): Payer: Medicare Other

## 2013-08-04 ENCOUNTER — Ambulatory Visit (HOSPITAL_COMMUNITY): Payer: Medicare Other | Admitting: Oncology

## 2013-08-04 ENCOUNTER — Other Ambulatory Visit (HOSPITAL_COMMUNITY): Payer: Self-pay | Admitting: Oncology

## 2013-08-04 LAB — BASIC METABOLIC PANEL
BUN: 7 mg/dL (ref 6–23)
CO2: 22 mEq/L (ref 19–32)
Calcium: 8 mg/dL — ABNORMAL LOW (ref 8.4–10.5)
Creatinine, Ser: 0.94 mg/dL (ref 0.50–1.10)
Glucose, Bld: 137 mg/dL — ABNORMAL HIGH (ref 70–99)

## 2013-08-04 LAB — GLUCOSE, CAPILLARY
Glucose-Capillary: 131 mg/dL — ABNORMAL HIGH (ref 70–99)
Glucose-Capillary: 121 mg/dL — ABNORMAL HIGH (ref 70–99)

## 2013-08-04 LAB — CBC WITH DIFFERENTIAL/PLATELET
Basophils Absolute: 0.1 10*3/uL (ref 0.0–0.1)
Eosinophils Absolute: 0.2 10*3/uL (ref 0.0–0.7)
Eosinophils Relative: 1 % (ref 0–5)
Lymphs Abs: 2.8 10*3/uL (ref 0.7–4.0)
MCH: 29.4 pg (ref 26.0–34.0)
MCV: 88.3 fL (ref 78.0–100.0)
Platelets: 321 10*3/uL (ref 150–400)
RDW: 17.1 % — ABNORMAL HIGH (ref 11.5–15.5)

## 2013-08-04 NOTE — Progress Notes (Signed)
NAMESAFIRE, GORDIN                ACCOUNT NO.:  [id number]  MEDICAL RECORD NO.:  [id number]  LOCATION:  A337                          FACILITY:  [facility]  PHYSICIAN:  Melvyn Novas, MDDATE OF BIRTH:  12-Jul-1946  DATE OF PROCEDURE: DATE OF DISCHARGE:                                PROGRESS NOTE   SUBJECTIVE:  The patient has stage IV pancreatic carcinoma, fever of unknown origin, currently afebrile after 5 days of imipenem who grew out Klebsiella pneumonia and bacteremia that was responsive to imipenem. She has been afebrile.  Clinically, no fever, rigors, chills; however, white count has climbed to 22,000, hemoglobin 10.3.  Creatinine 0.94.  OBJECTIVE:  VITAL SIGNS:  Blood pressure 157/92.  Temperature 98.2, pulse 91 and regular, respiratory rate is 18. LUNGS:  Clear.  No rales, wheeze, or rhonchi. HEART:  Regular rhythm.  No murmurs, gallops, or rubs. ABDOMEN:  She is status post paracentesis 72 hours ago for abdominal ascites.  Thus far, cultures are negative of ascitic fluid.  PLAN:  Right now is to continue imipenem and follow clinical course. The date upon removal catheter has occurred amongst myself, surgery, as well as Oncology.     Melvyn Novas, MD     RMD/MEDQ  D:  08/04/2013  T:  08/04/2013  Job:  [id number]

## 2013-08-04 NOTE — Progress Notes (Signed)
Subjective: Patient seen sitting in recliner eating dinner.   She denies any fevers or shaking chills.   Appetite is better.  She had 2 formed BMs today.  She reports that she is feeling well.   She denies any abdominal pain and abdominal distension.  I personally reviewed and went over laboratory results with the patient.  Objective: Vital signs in last 24 hours: Temp:  [98.2 F (36.8 C)-98.3 F (36.8 C)] 98.2 F (36.8 C) (09/08 1428) Pulse Rate:  [85-96] 85 (09/08 1428) Resp:  [18] 18 (09/08 1428) BP: (146-158)/(85-94) 146/85 mmHg (09/08 1428) SpO2:  [98 %-99 %] 98 % (09/08 1428) Weight:  [168 lb 14 oz (76.6 kg)] 168 lb 14 oz (76.6 kg) (09/08 0435)  Intake/Output from previous day: 09/07 0800 - 09/08 0759 In: 680 [P.O.:680] Out: -  Intake/Output this shift: Total I/O In: 360 [P.O.:360] Out: -   General appearance: alert, cooperative, appears stated age and no distress Resp: clear to auscultation bilaterally Cardio: regular rate and rhythm, S1, S2 normal, no murmur, click, rub or gallop GI: soft, non-tender; bowel sounds normal; no masses,  no organomegaly Extremities: extremities normal, atraumatic, no cyanosis or edema  Lab Results:   Recent Labs  08/03/13 0654 08/04/13 0447  WBC 22.8* 22.1*  HGB 10.3* 10.3*  HCT 30.8* 30.9*  PLT 254 321   BMET  Recent Labs  08/03/13 0654 08/04/13 0447  NA 140 141  K 3.7 3.5  CL 113* 112  CO2 21 22  GLUCOSE 114* 137*  BUN 7 7  CREATININE 0.89 0.94  CALCIUM 7.8* 8.0*    Studies/Results: No results found.  Medications: I have reviewed the patient's current medications.  Assessment/Plan: 1. Kleibsiella pneumoniae bacteremia, sensitive to Primaxin which she is on. At time of discharge, recommend PO antibiotics per sensitivity report for a minimum of 14 days. Do not recommend port removal since patient has been afebrile since 07/28/2013 and is clinically improving. Additionally, the port is highly unlikely to be  the source of this this gram negative infection, but we need to be observant for infection seeding of the catheter. Source of infection is more likely urinary or GI as this has been identified as Klebsiella bacteremia. If infection does not clear or recurs, then may consider port removal. Repeat blood cultures today. Blood cultures repeated on 9/5, 9/6, and 9/7 and thus far all negative.   Recommend outpatient Augmentin x 10 days. 2. Metastatic pancreatic cancer, D15 of cycle 5 of FOLFIRINOX with Neulasta support. Images reviewed with radiologist. Patient has stable disease per CT scan.  3. Anemia, S/P 1 unit PRBC on 07/30/2013. Reactive to bacteremia with slow recovery secondary to chemotherapy.  4. Thrombocytopenia, resolved  5. Hypokalemia, resolved.  6. Recurrent ascites. Chemotherapy switched to Gemcitabine/Abraxane as this recurrent ascites may be indicative of progression. S/P US guided paracentesis on 07/31/2013 with removal of 2700 cc of yellow fluid.  7. Recommend D/C from a Hem/Onc standpoint if medically stable.  This was discussed with the attending, Dr. Janna Arch via telephone this evening.  Recommend outpatient Augmentin PO x 10 days. Will see the patient in follow-up at the [facility] at the end of this week.  Will schedule chemo-teaching for the changed chemotherapy regimen.  Will plan on starting new chemotherapy regimen next week.   Patient and plan discussed with Dr. Erline Hau and he is in agreement with the aforementioned.    LOS: 8 days    [name] 08/04/2013

## 2013-08-04 NOTE — Progress Notes (Signed)
Nutrition Brief Note  RD drawn to pt chart due to LOS of 8 days.   Chart reviewed. Pt with hx of stage IV pancreatic CA and recently finished chemo. Pt was admitted for generalized weakness and fever. Pt is on IV antibiotics, being treated for Klebsiella pneumoniae bactremia   Wt Readings from Last 15 Encounters:  08/04/13 168 lb 14 oz (76.6 kg)  07/04/13 154 lb 8 oz (70.081 kg)  06/23/13 157 lb 12.8 oz (71.578 kg)  06/09/13 157 lb 12.8 oz (71.578 kg)  06/05/13 151 lb (68.493 kg)  05/29/13 155 lb (70.308 kg)  05/26/13 153 lb 3.2 oz (69.491 kg)  05/23/13 151 lb (68.493 kg)  05/23/13 151 lb (68.493 kg)  05/21/13 149 lb 8 oz (67.813 kg)  05/10/13 159 lb (72.122 kg)  05/10/13 159 lb (72.122 kg)  05/10/13 159 lb (72.122 kg)  05/07/13 162 lb (73.483 kg)  05/03/13 162 lb (73.483 kg)   Wt hx reveals steady weight increase. Pt with ascites and recently received paracentesis, which may be a contributing factor to weight changes.   Body mass index is 27.27 kg/(m^2). Patient meets criteria for overweight based on current BMI.   Current diet order is lactose restricted, patient is consuming approximately 50-100% of meals at this time. Labs and medications reviewed.   No nutrition interventions warranted at this time. If nutrition issues arise, please consult RD.   Scotti Motter A. Mayford Knife, RD, LDN Pager: [phone]

## 2013-08-04 NOTE — Progress Notes (Signed)
NAMEBRETTANY, SYDNEY                ACCOUNT NO.:  [id number]  MEDICAL RECORD NO.:  [id number]  LOCATION:                                 FACILITY:  PHYSICIAN:  Melvyn Novas, MDDATE OF BIRTH:  1946-04-15  DATE OF PROCEDURE:  08/03/2013 DATE OF DISCHARGE:                                PROGRESS NOTE   The patient has stage IV metastatic pancreatic carcinoma with invasion to spleen and biliary tree.  She has Klebsiella pneumonia bacteremia with 4 subsequent negative blood cultures clinically responding to imipenem IV.  Consideration was given to Port-A-Cath being either contaminated or possible source of infection from bacteremia.  I discussed this with surgery.  We are being vigilant for this and observing day to day.  Oncology feels that the Port-A-Cath is unlikely to be the source of infection.  She is currently afebrile and has been 4 days.  White count is climbing, however, from 8-17 to 22.8000 with left shift.  Hemoglobin 10.3 and stable.  Renal function and creatinine 0.8. Electrolytes fine.  The patient had ultrasound-guided paracentesis. Cultures from ascitic fluid, thus far are negative.  Cultures of stools are negative for C. diff and other stool pathogens with a lack of objective evidence of bacteremia or other source of infection.  We will continue imipenem and observe white count and renal function.  Her lungs are clear.  No rales, wheeze, or rhonchi.  Heart regular rhythm, no murmurs, gallops, or rubs.  Abdomen is soft, nontender.  Bowel sounds are normoactive.     Melvyn Novas, MD     RMD/MEDQ  D:  08/03/2013  T:  08/03/2013  Job:  [id number]

## 2013-08-05 ENCOUNTER — Inpatient Hospital Stay (HOSPITAL_COMMUNITY): Payer: Medicare Other

## 2013-08-05 ENCOUNTER — Other Ambulatory Visit (HOSPITAL_COMMUNITY): Payer: Self-pay | Admitting: *Deleted

## 2013-08-05 DIAGNOSIS — C259 Malignant neoplasm of pancreas, unspecified: Secondary | ICD-10-CM

## 2013-08-05 LAB — CBC WITH DIFFERENTIAL/PLATELET
Basophils Absolute: 0 10*3/uL (ref 0.0–0.1)
Basophils Relative: 0 % (ref 0–1)
Eosinophils Absolute: 0.2 10*3/uL (ref 0.0–0.7)
Eosinophils Relative: 1 % (ref 0–5)
Lymphocytes Relative: 13 % (ref 12–46)
MCH: 29.3 pg (ref 26.0–34.0)
MCHC: 33.5 g/dL (ref 30.0–36.0)
MCV: 87.7 fL (ref 78.0–100.0)
Platelets: 312 10*3/uL (ref 150–400)
RDW: 16.9 % — ABNORMAL HIGH (ref 11.5–15.5)
WBC: 17.7 10*3/uL — ABNORMAL HIGH (ref 4.0–10.5)

## 2013-08-05 LAB — BODY FLUID CULTURE: Culture: NO GROWTH

## 2013-08-05 MED ORDER — DEXAMETHASONE 4 MG PO TABS: 8.0000 mg | ORAL_TABLET | Freq: Two times a day (BID) | ORAL | Status: DC

## 2013-08-05 MED ORDER — VANCOMYCIN HCL 10 G IV SOLR
1500.0000 mg | Freq: Once | INTRAVENOUS | Status: AC
  Administered 2013-08-05: 1500 mg via INTRAVENOUS
  Filled 2013-08-05: qty 1500

## 2013-08-05 MED ORDER — VANCOMYCIN HCL IN DEXTROSE 1-5 GM/200ML-% IV SOLN
1000.0000 mg | Freq: Two times a day (BID) | INTRAVENOUS | Status: DC
  Administered 2013-08-05 – 2013-08-07 (×4): 1000 mg via INTRAVENOUS
  Filled 2013-08-05 (×4): qty 200

## 2013-08-05 MED ORDER — ONDANSETRON HCL 8 MG PO TABS: 8.0000 mg | ORAL_TABLET | Freq: Two times a day (BID) | ORAL | Status: DC

## 2013-08-05 NOTE — Progress Notes (Signed)
Pt for paracentesis with Dr.Boles. Lidocaine 1% 8ml given by MD. Pt tolerating procedure well. No signs of distress.

## 2013-08-05 NOTE — Progress Notes (Signed)
Patient seen with [name]. S/p parecenssis today with some relief.  Blood culture on 9/7 is positive for gram-positive cocci, I think that this is most likely a contaminant.  We do not feel strongly that patient needs to have the port removed. Considering Gram-positive cocci which is different from previous gram-negative organism that was isolated at the beginning, this fits the picture of contaminant more than an infection besides patient is afebrile and other serial cultures have been negative. We ordered a repeat serial blood culture and will follow closely.  I discussed these recommendations with Dr. Janna Arch.

## 2013-08-05 NOTE — Progress Notes (Signed)
Subjective: Patient underwent US guided paracentesis today with 2250 mL of yellow fluid.  She reports that she feels much better.   Blood cultures reviewed.  Gram + cocci in clusters noted on cultures drawn on 08/03/2013.  Question whether this is a contaminant as it does not match up with previous infection.  The recent + culture is from port site.   Do not recommend port removal at this time. We may need to have the port removed depending on the next few days and subsequent blood culture results.   Objective: Vital signs in last 24 hours: Temp:  [98.2 F (36.8 C)-98.5 F (36.9 C)] 98.4 F (36.9 C) (09/09 1506) Pulse Rate:  [71-90] 72 (09/09 1506) Resp:  [16-20] 16 (09/09 1506) BP: (133-149)/(66-80) 149/80 mmHg (09/09 1407) SpO2:  [100 %] 100 % (09/09 1506) Weight:  [176 lb 5.9 oz (80 kg)] 176 lb 5.9 oz (80 kg) (09/09 0346)  Intake/Output from previous day: 09/08 0800 - 09/09 0759 In: 840 [P.O.:840] Out: -  Intake/Output this shift: Total I/O In: 480 [P.O.:480] Out: -   General appearance: alert, cooperative and no distress Resp: clear to auscultation bilaterally Cardio: regular rate and rhythm, S1, S2 normal, no murmur, click, rub or gallop GI: soft, non-tender; bowel sounds normal; no masses,  no organomegaly Extremities: extremities normal, atraumatic, no cyanosis or edema Port: Intact without erythema or discharge.  Lab Results:   Recent Labs  08/04/13 0447 08/05/13 0513  WBC 22.1* 17.7*  HGB 10.3* 9.3*  HCT 30.9* 27.8*  PLT 321 312   BMET  Recent Labs  08/03/13 0654 08/04/13 0447  NA 140 141  K 3.7 3.5  CL 113* 112  CO2 21 22  GLUCOSE 114* 137*  BUN 7 7  CREATININE 0.89 0.94  CALCIUM 7.8* 8.0*    Studies/Results: US Abdomen Limited  08/05/2013   *RADIOLOGY REPORT*  Clinical Data: Ascites.  Pancreatic cancer.  LIMITED ABDOMEN ULTRASOUND FOR ASCITES  Technique: Survey of the abdomen was performed  Comparison:  None.  Findings: Mild ascites in the left  upper and lower quadrants.  Mild ascites about the liver.  Moderate ascites in the right lower quadrant.  IMPRESSION: Moderate ascites.   Original Report Authenticated By: Jolaine Click, M.D.    Medications: I have reviewed the patient's current medications.  Assessment/Plan: 1. Kleibsiella pneumoniae bacteremia, sensitive to Primaxin which she is on. Positive blood cultures from 07/27/2013 of right antecubital, 07/27/2013 of left hand, 07/28/2013 of right hand.  Do not recommend port removal since patient has been afebrile since 07/28/2013 and is clinically improving. Additionally, the port is highly unlikely to be the source of this this gram negative infection, but we need to be observant for infection seeding of the catheter. Source of infection is more likely urinary or GI as this has been identified as Klebsiella bacteremia. If infection does not clear or recurs, then may consider port removal.  Recommend outpatient Augmentin x 10 days.  2. Gram + Cocci in clusters on blood culture from port-a-cath on 08/03/2013.  Likely contamination due to description of bacteria.  Will repeat cultures tonight and tomorrow. On vancomycin IV. 3. Metastatic pancreatic cancer, D16 of cycle 5 of FOLFIRINOX with Neulasta support. Images reviewed with radiologist. Patient has stable disease per CT scan.  3. Anemia, S/P 1 unit PRBC on 07/30/2013. Reactive to bacteremia with slow recovery secondary to chemotherapy.  4. Thrombocytopenia, resolved  5. Hypokalemia, resolved.  6. Recurrent ascites. Chemotherapy switched to Gemcitabine/Abraxane as this recurrent  ascites may be indicative of progression. S/P US guided paracentesis on 07/31/2013 with removal of 2700 cc of yellow fluid and 08/05/2013 with removal of 2250 cc of yellow fluid.  7. Discussed case with Dr. Janna Arch via telephone. Maintain hospitalization until negative blood cultures.   Patient and plan discussed with Dr. Erline Hau and he is in agreement with the  aforementioned.      LOS: 9 days    KEFALAS,THOMAS 08/05/2013

## 2013-08-05 NOTE — Progress Notes (Signed)
ANTIBIOTIC CONSULT NOTE - INITIAL  Pharmacy Consult for Vancomycin Indication: Bacteremia, GPC  No Known Allergies  Patient Measurements: Height: 5\' 6"  (167.6 cm) Weight: 176 lb 5.9 oz (80 kg) IBW/kg (Calculated) : 59.3  Vital Signs: Temp: 98.3 F (36.8 C) (09/09 0526) Temp src: Oral (09/09 0526) BP: 136/66 mmHg (09/09 0526) Pulse Rate: 71 (09/09 0526) Intake/Output from previous day: 09/08 0701 - 09/09 0700 In: 840 [P.O.:840] Out: -  Intake/Output from this shift:    Labs:  Recent Labs  08/03/13 0654 08/04/13 0447 08/05/13 0513  WBC 22.8* 22.1* 17.7*  HGB 10.3* 10.3* 9.3*  PLT 254 321 312  CREATININE 0.89 0.94  --    Estimated Creatinine Clearance: 63.7 ml/min (by C-G formula based on Cr of 0.94). No results found for this basename: VANCOTROUGH, Leodis Binet, VANCORANDOM, GENTTROUGH, GENTPEAK, GENTRANDOM, TOBRATROUGH, TOBRAPEAK, TOBRARND, AMIKACINPEAK, AMIKACINTROU, AMIKACIN,  in the last 72 hours   Microbiology: Recent Results (from the past 720 hour(s))  CULTURE, BLOOD (ROUTINE X 2)     Status: None   Collection Time    07/27/13  7:14 PM      Result Value Range Status   Specimen Description BLOOD RIGHT ANTECUBITAL   Final   Special Requests BOTTLES DRAWN AEROBIC AND ANAEROBIC 12CC EACH   Final   Culture  Setup Time     Final   Value: 07/28/2013 23:07     Performed at [facility]   Culture     Final   Value: KLEBSIELLA PNEUMONIAE     Note: SUSCEPTIBILITIES PERFORMED ON PREVIOUS CULTURE WITHIN THE LAST 5 DAYS.     Note: WATKINS T. AT 1107 ON 07/28/2013 BY BAUGHAM M.     Performed at [facility]   Report Status 07/30/2013 FINAL   Final  CULTURE, BLOOD (ROUTINE X 2)     Status: None   Collection Time    07/27/13  7:19 PM      Result Value Range Status   Specimen Description BLOOD LEFT HAND   Final   Special Requests BOTTLES DRAWN AEROBIC AND ANAEROBIC [facility]   Final   Culture  Setup Time     Final   Value: 07/28/2013 23:07     Performed  at [facility]   Culture     Final   Value: KLEBSIELLA PNEUMONIAE     Note: Gram Stain Report Called to,Read Back By and Verified With: WATKINS T. AT 1107 ON 07/28/2013 BY Luetta Nutting M.     Performed at [facility]   Report Status 07/30/2013 FINAL   Final   Organism ID, Bacteria KLEBSIELLA PNEUMONIAE   Final  URINE CULTURE     Status: None   Collection Time    07/27/13  7:47 PM      Result Value Range Status   Specimen Description URINE, CLEAN CATCH   Final   Special Requests IMMUNE:COMPROMISED   Final   Culture  Setup Time     Final   Value: 07/28/2013 23:31     Performed at [facility] Count     Final   Value: 45,000 COLONIES/ML     Performed at [facility]   Culture     Final   Value: Multiple bacterial morphotypes present, none predominant. Suggest appropriate recollection if clinically indicated.     Performed at [facility]   Report Status 07/29/2013 FINAL   Final  CLOSTRIDIUM DIFFICILE BY PCR     Status: None  Collection Time    07/28/13 12:35 AM      Result Value Range Status   C difficile by pcr NEGATIVE  NEGATIVE Final  STOOL CULTURE     Status: None   Collection Time    07/28/13 12:35 AM      Result Value Range Status   Specimen Description STOOL   Final   Special Requests NONE   Final   Culture     Final   Value: NO SALMONELLA, SHIGELLA, CAMPYLOBACTER, YERSINIA, OR E.COLI 0157:H7 ISOLATED     Performed at [facility]   Report Status 08/01/2013 FINAL   Final  CULTURE, BLOOD (ROUTINE X 2)     Status: None   Collection Time    07/28/13  5:57 AM      Result Value Range Status   Specimen Description BLOOD RIGHT HAND   Final   Special Requests     Final   Value: BOTTLES DRAWN AEROBIC AND ANAEROBIC 6CC  IMMUNE:COMPROMISED   Culture  Setup Time     Final   Value: 08/01/2013 09:00     Performed at [facility]   Culture     Final   Value: KLEBSIELLA PNEUMONIAE        BLOOD CULTURE RECEIVED NO  GROWTH TO DATE CULTURE WILL BE HELD FOR 5 DAYS BEFORE ISSUING A FINAL NEGATIVE REPORT     Note: Gram Stain Report Called to,Read Back By and Verified With: Zannie Kehr RN ON [phone] AT 0930 BY RESSEGGER R     Performed at [facility]   Report Status 08/04/2013 FINAL   Final  CULTURE, BLOOD (ROUTINE X 2)     Status: None   Collection Time    07/28/13  5:57 AM      Result Value Range Status   Specimen Description BLOOD RIGHT ANTECUBITAL   Final   Special Requests     Final   Value: BOTTLES DRAWN AEROBIC AND ANAEROBIC 6CC IMMUNE:COMPROMISED   Culture NO GROWTH 6 DAYS   Final   Report Status 08/03/2013 FINAL   Final  BODY FLUID CULTURE     Status: None   Collection Time    08/01/13  1:31 PM      Result Value Range Status   Specimen Description PERITONEAL   Final   Special Requests NONE   Final   Gram Stain     Final   Value: NO WBC SEEN     NO ORGANISMS SEEN     Performed at [facility]   Culture     Final   Value: NO GROWTH 2 DAYS     Performed at [facility]   Report Status PENDING   Incomplete  CULTURE, BODY FLUID-BOTTLE     Status: None   Collection Time    08/01/13  1:31 PM      Result Value Range Status   Specimen Description PERITONEAL   Final   Special Requests NONE   Final   Culture NO GROWTH 3 DAYS   Final   Report Status PENDING   Incomplete  GRAM STAIN     Status: None   Collection Time    08/01/13  1:38 PM      Result Value Range Status   Specimen Description PERITONEAL CAVITY   Final   Special Requests NONE   Final   Gram Stain     Final   Value: WBC PRESENT,BOTH PMN AND MONONUCLEAR  NO ORGANISMS SEEN     Performed at [facility]     Performed at [facility]   Report Status 08/02/2013 FINAL   Final  ANAEROBIC CULTURE     Status: None   Collection Time    08/01/13  1:38 PM      Result Value Range Status   Specimen Description PERITONEAL CAVITY   Final   Special Requests NONE   Final   Gram Stain     Final    Value: NO WBC SEEN     NO ORGANISMS SEEN     Performed at [facility]   Culture     Final   Value: NO ANAEROBES ISOLATED; CULTURE IN PROGRESS FOR 5 DAYS     Performed at [facility]   Report Status PENDING   Incomplete  CULTURE, BLOOD (ROUTINE X 2)     Status: None   Collection Time    08/01/13  6:00 PM      Result Value Range Status   Specimen Description Blood BLOOD RIGHT ARM   Final   Special Requests BOTTLES DRAWN AEROBIC AND ANAEROBIC 6CC   Final   Culture NO GROWTH 3 DAYS   Final   Report Status PENDING   Incomplete  CULTURE, BLOOD (ROUTINE X 2)     Status: None   Collection Time    08/01/13  6:10 PM      Result Value Range Status   Specimen Description Blood PORTA CATH   Final   Special Requests     Final   Value: BOTTLES DRAWN AEROBIC AND ANAEROBIC 6CC DRAWN BY RN   Culture NO GROWTH 3 DAYS   Final   Report Status PENDING   Incomplete  CULTURE, BLOOD (ROUTINE X 2)     Status: None   Collection Time    08/02/13  2:23 PM      Result Value Range Status   Specimen Description Blood   Final   Special Requests Immunocompromised   Final   Culture NO GROWTH 2 DAYS   Final   Report Status PENDING   Incomplete  CULTURE, BLOOD (ROUTINE X 2)     Status: None   Collection Time    08/02/13  2:23 PM      Result Value Range Status   Specimen Description Blood   Final   Special Requests Immunocompromised   Final   Culture NO GROWTH 2 DAYS   Final   Report Status PENDING   Incomplete  CULTURE, BLOOD (ROUTINE X 2)     Status: None   Collection Time    08/03/13  2:43 PM      Result Value Range Status   Specimen Description BLOOD PORT   Final   Special Requests     Final   Value: BOTTLES DRAWN AEROBIC AND ANAEROBIC AEB 15CC ANA 12CC   Culture     Final   Value: GRAM POSITIVE COCCI IN CLUSTERS     Gram Stain Report Called to,Read Back By and Verified With: Knute Neu RN ON [phone] AT 0450 BY RESSEGGER R   Report Status PENDING   Incomplete  CULTURE, BLOOD  (ROUTINE X 2)     Status: None   Collection Time    08/03/13  2:43 PM      Result Value Range Status   Specimen Description BLOOD LEFT ANTECUBITAL   Final   Special Requests     Final   Value: BOTTLES DRAWN AEROBIC  AND ANAEROBIC AEB 11CC ANA 9CC   Culture NO GROWTH 1 DAY   Final   Report Status PENDING   Incomplete   Medical History: Past Medical History  Diagnosis Date  . Diabetes mellitus without complication   . Hypertension   . Anxiety   . Arthritis   . Pancreatic cancer    Medications:  Scheduled:  . enoxaparin (LOVENOX) injection  40 mg Subcutaneous Q24H  . imipenem-cilastatin  500 mg Intravenous Q8H  . insulin aspart  0-9 Units Subcutaneous TID WC  . insulin glargine  20 Units Subcutaneous QHS  . potassium chloride  20 mEq Oral TID  . vancomycin  1,500 mg Intravenous Once  . vancomycin  1,000 mg Intravenous Q12H   Assessment: 67yo female with h/o pancreatic cancer.  Pt has positive blood culture with GPC in clusters.  Estimated Creatinine Clearance: 63.7 ml/min (by C-G formula based on Cr of 0.94).   Pt is also on Primaxin.  Goal of Therapy:  Vancomycin trough level 15-20 mcg/ml  Plan:  Vancomycin 1500mg  IV now x 1 then Vancomycin 1000mg  IV q12h Check trough at steady state Monitor labs, renal fxn, and cultures  Valrie Hart A 08/05/2013,8:33 AM

## 2013-08-05 NOTE — Progress Notes (Signed)
CRITICAL VALUE ALERT  Critical value received:  Gram positive cocci in clusters (blood cultures)  Date of notification:  08/04/2013  Time of notification:  0450  Critical value read back: yes  Nurse who received alert:  Knute Neu RN  MD notified (1st page):   Dondiego  Time of first page:  0545  MD notified (2nd page): Dondiego  Time of second page: 0600  Responding MD:  Janna Arch  Time MD responded:  [phone]

## 2013-08-05 NOTE — Patient Instructions (Addendum)
[facility]   CHEMOTHERAPY INSTRUCTIONS  Abraxane - bone marrow suppression (lowers white blood cells (fight infection), lowers red blood cells (make up your blood), lowers platelets (help blood to clot). Sensory neuropathy - this means you can't tell what object you are touching or if you are touching anything at all, muscle aches, nausea, vomiting, diarrhea, mucositis (inflammation of mouth, throat, mouth sores), hair loss.  Gemcitabine - bone marrow suppression (lowers white blood cells (fight infection), lowers red blood cells (make up your blood), lowers platelets (help blood to clot). Nausea/vomiting,fever, flu-like symptoms, rash.    EDUCATIONAL MATERIALS GIVEN AND REVIEWED: Chemotherapy and You Specific Instructions on Abraxane/Gemcitabine   SELF CARE ACTIVITIES WHILE ON CHEMOTHERAPY: Increase your fluid intake 48 hours prior to treatment and drink at least 2 quarts per day after treatment., No alcohol intake., No aspirin or other medications unless approved by your oncologist., Eat foods that are light and easy to digest., Eat foods at cold or room temperature., No fried, fatty, or spicy foods immediately before or after treatment., Have teeth cleaned professionally before starting treatment. Keep dentures and partial plates clean., Use soft toothbrush and do not use mouthwashes that contain alcohol. Biotene is a good mouthwash that is available at most pharmacies or may be ordered by calling [phone]., Use warm salt water gargles (1 teaspoon salt per 1 quart warm water) before and after meals and at bedtime. Or you may rinse with 2 tablespoons of three -percent hydrogen peroxide mixed in eight ounces of water., Always use sunscreen with SPF (Sun Protection Factor) of 30 or higher., Use your nausea medication as directed to prevent nausea., Use your stool softener or laxative as directed to prevent constipation. and Use your anti-diarrheal medication  as directed to stop diarrhea.  Please wash your hands for at least 30 seconds using warm soapy water. Handwashing is the #1 way to prevent the spread of germs. Stay away from sick people or people who are getting over a cold. If you develop respiratory systems such as green/yellow mucus production or productive cough or persistent cough let us know and we will see if you need an antibiotic. It is a good idea to keep a pair of gloves on when going into grocery stores/[employer] to decrease your risk of coming into contact with germs on the carts, etc. Carry alcohol hand gel with you at all times and use it frequently if out in public. All foods need to be cooked thoroughly. No raw foods. No medium or undercooked meats, eggs. If your food is cooked medium well, it does not need to be hot pink or saturated with bloody liquid at all. Vegetables and fruits need to be washed/rinsed under the faucet with a dish detergent before being consumed. You can eat raw fruits and vegetables unless we tell you otherwise but it would be best if you cooked them or bought frozen. Do not eat off of salad bars or hot bars unless you really trust the cleanliness of the restaurant. If you need dental work, please [place] know before you go for your appointment so that we can coordinate the best possible time for you in regards to your chemo regimen. You need to also let your dentist know that you are actively taking chemo. We may need to do labs prior to your dental appointment. We also want your bowels moving at least every other day. If this is not happening, we need to know so that we can  get you on a bowel regimen to help you go.      MEDICATIONS: You have been given prescriptions for the following medications:  Dexamethasone 4mg  tablet. Starting the day after chemo, take 1 tablet in the am and 1 tablet in the pm x 2 days.  Zofran/Ondansetron 8mg  tablet. Starting the day after chemo, take 1 tablet in the am for 2 days. Then may take  1 tablet two times a day if needed for nausea/vomiting.   Colace - this is a stool softener. Take 100mg  capsule 2-6 times a day as needed. If you have to take more than 6 capsules of Colace a day call the Cancer Center.  Senna - this is a mild laxative used to treat mild constipation. May take 2 tabs by mouth daily or up to twice a day as needed for mild constipation.  Milk of Magnesia - this is a laxative used to treat moderate to severe constipation. May take 2-4 tablespoons every 8 hours as needed. May increase to 8 tablespoons x 1 dose and if no bowel movement call the Cancer Center.  Imodium - this is for diarrhea. Take 2 tabs after 1st loose stool and then 1 tab after each loose stool until you go a total of 12 hours without a loose stool. Call Cancer Center if loose stools continue.   SYMPTOMS TO REPORT AS SOON AS POSSIBLE AFTER TREATMENT:  FEVER GREATER THAN 100.5 F  CHILLS WITH OR WITHOUT FEVER  NAUSEA AND VOMITING THAT IS NOT CONTROLLED WITH YOUR NAUSEA MEDICATION  UNUSUAL SHORTNESS OF BREATH  UNUSUAL BRUISING OR BLEEDING  TENDERNESS IN MOUTH AND THROAT WITH OR WITHOUT PRESENCE OF ULCERS  URINARY PROBLEMS  BOWEL PROBLEMS  UNUSUAL RASH    Wear comfortable clothing and clothing appropriate for easy access to any Portacath or PICC line. Let us know if there is anything that we can do to make your therapy better!      I have been informed and understand all of the instructions given to me and have received a copy. I have been instructed to call the clinic [phone] or my family physician as soon as possible for continued medical care, if indicated. I do not have any more questions at this time but understand that I may call the Cancer Center or the Patient Navigator at [phone] during office hours should I have questions or need assistance in obtaining follow-up care.      _________________________________________      _______________      __________ Signature of Patient or Authorized Representative        Date                            Time      _________________________________________ Nurse's Signature      Nanoparticle Albumin-Bound Paclitaxel injection What is this medicine? NANOPARTICLE ALBUMIN-BOUND PACLITAXEL (Na no PAHR ti kuhl al BYOO muhn-bound PAK li TAX el) is a chemotherapy drug. It targets fast dividing cells, like cancer cells, and causes these cells to die. This medicine is used to treat advanced breast cancer and advanced lung cancer. This medicine may be used for other purposes; ask your health care provider or pharmacist if you have questions. What should I tell my health care provider before I take this medicine? They need to know if you have any of these conditions: -kidney disease -liver disease -low blood counts, like low platelets, red blood cells,  or white blood cells -recent or ongoing radiation therapy -an unusual or allergic reaction to paclitaxel, albumin, other chemotherapy, other medicines, foods, dyes, or preservatives -pregnant or trying to get pregnant -breast-feeding How should I use this medicine? This drug is given as an infusion into a vein. It is administered in a hospital or clinic by a specially trained health care professional. Talk to your pediatrician regarding the use of this medicine in children. Special care may be needed. Overdosage: If you think you have taken too much of this medicine contact a poison control center or emergency room at once. NOTE: This medicine is only for you. Do not share this medicine with others. What if I miss a dose? It is important not to miss your dose. Call your doctor or health care professional if you are unable to keep an appointment. What may interact with this medicine? This medicine may also interact with the following medications: -cyclosporine -dexamethasone -diazepam -ketoconazole -medicines to increase blood counts like  filgrastim, pegfilgrastim, sargramostim -other chemotherapy drugs like cisplatin, doxorubicin, epirubicin, etoposide, teniposide, vincristine -quinidine -testosterone -vaccines -verapamil Talk to your doctor or health care professional before taking any of these medicines: -acetaminophen -aspirin -ibuprofen -ketoprofen -naproxen This list may not describe all possible interactions. Give your health care provider a list of all the medicines, herbs, non-prescription drugs, or dietary supplements you use. Also tell them if you smoke, drink alcohol, or use illegal drugs. Some items may interact with your medicine. What should I watch for while using this medicine? Your condition will be monitored carefully while you are receiving this medicine. You will need important blood work done while you are taking this medicine. This drug may make you feel generally unwell. This is not uncommon, as chemotherapy can affect healthy cells as well as cancer cells. Report any side effects. Continue your course of treatment even though you feel ill unless your doctor tells you to stop. In some cases, you may be given additional medicines to help with side effects. Follow all directions for their use. Call your doctor or health care professional for advice if you get a fever, chills or sore throat, or other symptoms of a cold or flu. Do not treat yourself. This drug decreases your body's ability to fight infections. Try to avoid being around people who are sick. This medicine may increase your risk to bruise or bleed. Call your doctor or health care professional if you notice any unusual bleeding. Be careful brushing and flossing your teeth or using a toothpick because you may get an infection or bleed more easily. If you have any dental work done, tell your dentist you are receiving this medicine. Avoid taking products that contain aspirin, acetaminophen, ibuprofen, naproxen, or ketoprofen unless instructed by your  doctor. These medicines may hide a fever. Do not become pregnant while taking this medicine. Women should inform their doctor if they wish to become pregnant or think they might be pregnant. There is a potential for serious side effects to an unborn child. Talk to your health care professional or pharmacist for more information. Do not breast-feed an infant while taking this medicine. Men are advised not to father a child while receiving this medicine. What side effects may I notice from receiving this medicine? Side effects that you should report to your doctor or health care professional as soon as possible: -allergic reactions like skin rash, itching or hives, swelling of the face, lips, or tongue -low blood counts - This drug may decrease  the number of white blood cells, red blood cells and platelets. You may be at increased risk for infections and bleeding. -signs of infection - fever or chills, cough, sore throat, pain or difficulty passing urine -signs of decreased platelets or bleeding - bruising, pinpoint red spots on the skin, black, tarry stools, nosebleeds -signs of decreased red blood cells - unusually weak or tired, fainting spells, lightheadedness -breathing problems -changes in vision -chest pain -high or low blood pressure -mouth sores -nausea and vomiting -pain, swelling, redness or irritation at the injection site -pain, tingling, numbness in the hands or feet -slow or irregular heartbeat -swelling of the ankle, feet, hands Side effects that usually do not require medical attention (report to your doctor or health care professional if they continue or are bothersome): -aches, pains -changes in the color of fingernails -diarrhea -hair loss -loss of appetite This list may not describe all possible side effects. Call your doctor for medical advice about side effects. You may report side effects to FDA at 1-800-FDA-1088. Where should I keep my medicine? This drug is given  in a hospital or clinic and will not be stored at home. NOTE: This sheet is a summary. It may not cover all possible information. If you have questions about this medicine, talk to your doctor, pharmacist, or health care provider.  2013, [employer]/Gold Standard. (09/18/2011 4:13:49 PM) Gemcitabine injection What is this medicine? GEMCITABINE (jem SIT a been) is a chemotherapy drug. This medicine is used to treat many types of cancer like breast cancer, lung cancer, pancreatic cancer, and ovarian cancer. This medicine may be used for other purposes; ask your health care provider or pharmacist if you have questions. What should I tell my health care provider before I take this medicine? They need to know if you have any of these conditions: -blood disorders -infection -kidney disease -liver disease -recent or ongoing radiation therapy -an unusual or allergic reaction to gemcitabine, other chemotherapy, other medicines, foods, dyes, or preservatives -pregnant or trying to get pregnant -breast-feeding How should I use this medicine? This drug is given as an infusion into a vein. It is administered in a hospital or clinic by a specially trained health care professional. Talk to your pediatrician regarding the use of this medicine in children. Special care may be needed. Overdosage: If you think you have taken too much of this medicine contact a poison control center or emergency room at once. NOTE: This medicine is only for you. Do not share this medicine with others. What if I miss a dose? It is important not to miss your dose. Call your doctor or health care professional if you are unable to keep an appointment. What may interact with this medicine? -medicines to increase blood counts like filgrastim, pegfilgrastim, sargramostim -some other chemotherapy drugs like cisplatin -vaccines Talk to your doctor or health care professional before taking any of these  medicines: -acetaminophen -aspirin -ibuprofen -ketoprofen -naproxen This list may not describe all possible interactions. Give your health care provider a list of all the medicines, herbs, non-prescription drugs, or dietary supplements you use. Also tell them if you smoke, drink alcohol, or use illegal drugs. Some items may interact with your medicine. What should I watch for while using this medicine? Visit your doctor for checks on your progress. This drug may make you feel generally unwell. This is not uncommon, as chemotherapy can affect healthy cells as well as cancer cells. Report any side effects. Continue your course of treatment even though you  feel ill unless your doctor tells you to stop. In some cases, you may be given additional medicines to help with side effects. Follow all directions for their use. Call your doctor or health care professional for advice if you get a fever, chills or sore throat, or other symptoms of a cold or flu. Do not treat yourself. This drug decreases your body's ability to fight infections. Try to avoid being around people who are sick. This medicine may increase your risk to bruise or bleed. Call your doctor or health care professional if you notice any unusual bleeding. Be careful brushing and flossing your teeth or using a toothpick because you may get an infection or bleed more easily. If you have any dental work done, tell your dentist you are receiving this medicine. Avoid taking products that contain aspirin, acetaminophen, ibuprofen, naproxen, or ketoprofen unless instructed by your doctor. These medicines may hide a fever. Women should inform their doctor if they wish to become pregnant or think they might be pregnant. There is a potential for serious side effects to an unborn child. Talk to your health care professional or pharmacist for more information. Do not breast-feed an infant while taking this medicine. What side effects may I notice from  receiving this medicine? Side effects that you should report to your doctor or health care professional as soon as possible: -allergic reactions like skin rash, itching or hives, swelling of the face, lips, or tongue -low blood counts - this medicine may decrease the number of white blood cells, red blood cells and platelets. You may be at increased risk for infections and bleeding. -signs of infection - fever or chills, cough, sore throat, pain or difficulty passing urine -signs of decreased platelets or bleeding - bruising, pinpoint red spots on the skin, black, tarry stools, blood in the urine -signs of decreased red blood cells - unusually weak or tired, fainting spells, lightheadedness -breathing problems -chest pain -mouth sores -nausea and vomiting -pain, swelling, redness at site where injected -pain, tingling, numbness in the hands or feet -stomach pain -swelling of ankles, feet, hands -unusual bleeding Side effects that usually do not require medical attention (report to your doctor or health care professional if they continue or are bothersome): -constipation -diarrhea -hair loss -loss of appetite -stomach upset This list may not describe all possible side effects. Call your doctor for medical advice about side effects. You may report side effects to FDA at 1-800-FDA-1088. Where should I keep my medicine? This drug is given in a hospital or clinic and will not be stored at home. NOTE: This sheet is a summary. It may not cover all possible information. If you have questions about this medicine, talk to your doctor, pharmacist, or health care provider.  2013, [employer]vier/Gold Standard. (03/24/2008 6:45:54 PM)

## 2013-08-05 NOTE — Progress Notes (Signed)
Patient seen with Thom kefalas. S/p parecenssis today with some relief.  Blood culture on 9/7 is positive for gram-positive cocci, I think that this is most likely a contaminant.  We do not feel strongly that patient needs to have the port removed. Considering Gram-positive cocci which is different from previous gram-negative organism that was isolated at the beginning, this fits the picture of contaminant more than an infection besides patient is afebrile and other serial cultures have been negative. We ordered a repeat serial blood culture and will follow closely.  I discussed these recommendations with Dr. Dondiego.        

## 2013-08-05 NOTE — Progress Notes (Signed)
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  This was treated with imipenem and clinically responsive.  We were considering discharge on Augmentin as an oral agent; however, this morning routine blood cultures from September 7 grew out one out of two gram-positive cocci in clusters.  She had the addition of vancomycin this morning to presumably cover this and cultures over ascitic fluid thus far are negative from paracentesis 3 days ago.  OBJECTIVE:  GENERAL:  The patient is alert and oriented. VITAL SIGNS:  Temperature 98.3, respiratory rate 18, blood pressure 136/66. LUNGS:  Clear. HEART:  Unremarkable.  Regular rhythm.  No murmurs, gallops, or rubs. ABDOMEN:  Soft.  Mild fluid wave.  No guarding or rebound, no mass, no megaly.  LABORATORY DATA:  WBC is diminished from 22,000 to 17,000, hemoglobin stable at 9.3.  Creatinine 0.94.  PLAN:  Right now is continue imipenem as well as vancomycin.  We will discuss the case hopefully with Infectious Disease at [facility] to see if there is a more appropriate antibiotic regimen or possible transfer if they deem clinically necessary.     Melvyn Novas, MD     RMD/MEDQ  D:  08/05/2013  T:  08/05/2013  Job:  [id number]

## 2013-08-05 NOTE — Procedures (Signed)
PreOperative Dx: Ascites Postoperative Dx: Ascites Procedure:   US guided paracentesis Radiologist:  Tyron Russell Anesthesia:  8 ml of 1% lidocaine Specimen:  2250 ml of clear yellow ascitic fluid EBL:   None Complications: None

## 2013-08-05 NOTE — Progress Notes (Signed)
Chemo teaching done and consent signed for gemcitabine/abraxane

## 2013-08-05 NOTE — Progress Notes (Signed)
[id number] 

## 2013-08-05 NOTE — Progress Notes (Signed)
Hypoglycemic Event  CBG: 57  Treatment: 15 GM carbohydrate snack  Symptoms: None  Follow-up CBG: Time:1645 CBG Result:79  Possible Reasons for Event: Unknown  Comments/MD notified: Pt given orange juice - Blood sugar at 79 upon recheck.  No insulin has been given today and patient has been eating - unknown cause.    Laney Pastor  Remember to initiate Hypoglycemia Order Set & complete

## 2013-08-05 NOTE — Progress Notes (Signed)
Paracentesis complete. No signs of distress yellow abdominal fluid removed.

## 2013-08-06 ENCOUNTER — Other Ambulatory Visit (HOSPITAL_COMMUNITY): Payer: Self-pay | Admitting: Oncology

## 2013-08-06 ENCOUNTER — Encounter (HOSPITAL_COMMUNITY): Payer: Medicare Other

## 2013-08-06 LAB — CULTURE, BLOOD (ROUTINE X 2)
Culture: NO GROWTH
Culture: NO GROWTH

## 2013-08-06 LAB — GLUCOSE, CAPILLARY: Glucose-Capillary: 126 mg/dL — ABNORMAL HIGH (ref 70–99)

## 2013-08-06 LAB — CBC WITH DIFFERENTIAL/PLATELET
Basophils Absolute: 0 10*3/uL (ref 0.0–0.1)
Basophils Relative: 0 % (ref 0–1)
Eosinophils Relative: 2 % (ref 0–5)
HCT: 30.9 % — ABNORMAL LOW (ref 36.0–46.0)
MCH: 29.3 pg (ref 26.0–34.0)
MCHC: 33.3 g/dL (ref 30.0–36.0)
MCV: 87.8 fL (ref 78.0–100.0)
Monocytes Absolute: 1.5 10*3/uL — ABNORMAL HIGH (ref 0.1–1.0)
RDW: 17.1 % — ABNORMAL HIGH (ref 11.5–15.5)

## 2013-08-06 LAB — ANAEROBIC CULTURE: Gram Stain: NONE SEEN

## 2013-08-06 NOTE — Progress Notes (Signed)
[id number] 

## 2013-08-06 NOTE — Progress Notes (Signed)
I had the anemia discussion today with Dr Janna Arch and I agreed to discuss ID Dr Staci Righter.  Multiple attempts to speak to him at his office we're unsuccessful as the phone was unanswered. We still believe that there is no indication at this time for removal of the Mediport, given negative serial blood cultures, patient being afebrile and the gram + cocci on 9/ 7 was likely a contaminant.

## 2013-08-06 NOTE — Progress Notes (Signed)
[name], [name]                ACCOUNT NO.:  [id number]  MEDICAL RECORD NO.:  [id number]  LOCATION:  A337                          FACILITY:  [facility]  PHYSICIAN:  Melvyn Novas, MDDATE OF BIRTH:  1946-01-16  DATE OF PROCEDURE:  08/06/2013 DATE OF DISCHARGE:                                PROGRESS NOTE   SUBJECTIVE:  The patient has stage IV pancreatic carcinoma with Klebsiella pneumoniae, bacteremia responsive to imipenem, subsequently 4 cultures grew out negative, 2 additional cultures were done 1 of which grew out gram-positive cocci in clusters.  This was covered with vancomycin in addition to the imipenem for the last 48 hours.  The patient has never been febrile.  She has central catheter in for chemotherapy.  She is doing well, tolerating food well.  Alert and oriented.  Multiple discussions about removal of catheter with Surgery and Oncology, as well as input from Infectious Disease, Dr. Lucianne Muss who felt that the last cultures probably contaminant Heme-Onc also felt this.  However, Dr. Lucianne Muss felt we should take out the catheter, put in a new catheter and give her 10 days of Rocephin 2 g per day to cover the Klebsiella for an additional period of time at home.  This will be arranged.  OBJECTIVE:  VITAL SIGNS:  Blood pressure 139/70, temperature 98.4, pulse is 82 and regular, respiratory rate is 20. LUNGS:  Clear.  No rales, wheeze, rhonchi. HEART:  Regular rhythm.  No murmurs, gallops, or rubs. ABDOMEN:  Soft, nontender.  Bowel sounds normoactive.  She likewise had ascitic fluid, paracentesis which grew out no pathogenic bacteria 5 days prior to today.  She is hemodynamically stable.  PLAN:  She will be discharged on Rocephin 2 g per day, and with cessation of vancomycin as well as imipenem.     Melvyn Novas, MD     RMD/MEDQ  D:  08/06/2013  T:  08/06/2013  Job:  [id number]

## 2013-08-06 NOTE — Progress Notes (Signed)
I have put a hold on PICC line placement until there is a definitive decision on whether the current Port-A-Cath needs to be removed. Oncology is contacting ID to determine this.

## 2013-08-06 NOTE — Progress Notes (Signed)
Subjective: Patient seen sitting on side of bed, preparing to take a walk around the unit.  Denies any complaints.   Objective: Vital signs in last 24 hours: Temp:  [98.1 F (36.7 C)-98.6 F (37 C)] 98.1 F (36.7 C) (09/10 1355) Pulse Rate:  [71-82] 71 (09/10 1355) Resp:  [18-20] 18 (09/10 1355) BP: (135-139)/(70-77) 139/70 mmHg (09/10 1355) SpO2:  [99 %-100 %] 100 % (09/10 1355) Weight:  [175 lb 14.8 oz (79.8 kg)] 175 lb 14.8 oz (79.8 kg) (09/10 0356)  Intake/Output from previous day: 09/09 0800 - 09/10 0759 In: 1200 [P.O.:1200] Out: -  Intake/Output this shift:    General appearance: alert and cooperative GI: soft, non-tender; bowel sounds normal; no masses,  no organomegaly  Lab Results:   Recent Labs  08/05/13 0513 08/06/13 0438  WBC 17.7* 15.5*  HGB 9.3* 10.3*  HCT 27.8* 30.9*  PLT 312 365   BMET  Recent Labs  08/04/13 0447  NA 141  K 3.5  CL 112  CO2 22  GLUCOSE 137*  BUN 7  CREATININE 0.94  CALCIUM 8.0*    Studies/Results: US Abdomen Limited  08/05/2013   *RADIOLOGY REPORT*  Clinical Data: Ascites.  Pancreatic cancer.  LIMITED ABDOMEN ULTRASOUND FOR ASCITES  Technique: Survey of the abdomen was performed  Comparison:  None.  Findings: Mild ascites in the left upper and lower quadrants.  Mild ascites about the liver.  Moderate ascites in the right lower quadrant.  IMPRESSION: Moderate ascites.   Original Report Authenticated By: Jolaine Click, M.D.   US Paracentesis  08/05/2013   *RADIOLOGY REPORT*  ULTRASOUND GUIDED PARACENTESIS:  Clinical Data:  Metastatic pancreatic cancer, ascites  Technique: After explanation of procedure, benefits, and risks, written informed consent was obtained. Time-out protocol was followed. Collection of ascites in right lower quadrant localized by ultrasound. Skin prepped and draped in usual sterile fashion. Skin and soft tissues anesthestized with 8 ml of 1% lidocaine. 5-French Yueh catheter placed into peritoneal cavity. 2250  ml of clear yellow fluid aspirated by vacuum bottle suction. Procedure tolerated well by patient without immediate complication.  IMPRESSION: Ultrasound-guided paracentesis of 2250 ml of ascitic fluid.   Original Report Authenticated By: Ulyses Southward, M.D.    Medications: I have reviewed the patient's current medications.  Assessment: 1. Kleibsiella pneumoniae bacteremia, sensitive to Primaxin which she is on. Positive blood cultures from 07/27/2013 of right antecubital, 07/27/2013 of left hand, 07/28/2013 of right hand. Do not recommend port removal since patient has been afebrile since 07/28/2013 and is clinically improving.  Additionally, the port is highly unlikely to be the source of this this gram negative infection, but we need to be observant for infection seeding of the catheter. Source of infection is more likely urinary or GI as this has been identified as Klebsiella bacteremia. If infection does not clear or recurs, then may consider port removal. Recommend outpatient treatment per infectious disease.  2. Gram + Cocci in clusters on blood culture from port-a-cath on 08/03/2013. Likely contamination due to description of bacteria. Negative cultures since. 3. Metastatic pancreatic cancer, D17 of cycle 5 of FOLFIRINOX with Neulasta support. Images reviewed with radiologist. Patient has stable disease per CT scan.  3. Anemia, S/P 1 unit PRBC on 07/30/2013. Reactive to bacteremia with slow recovery secondary to chemotherapy.  4. Thrombocytopenia, resolved  5. Hypokalemia, resolved.  6. Recurrent ascites. Chemotherapy switched to Gemcitabine/Abraxane as this recurrent ascites may be indicative of progression. S/P US guided paracentesis on 07/31/2013 with removal of 2700 cc  of yellow fluid and 08/05/2013 with removal of 2250 cc of yellow fluid.  7. Discussed case with Dr. Janna Arch via telephone.  Tried to contact Dr. Luciana Axe but unable to touch base with him.  Will try again tomorrow.    PLAN: 1. Strongly  discourage port-a-cath removal as she does not meet criteria for port removal according to Infectious Diseases Society of [place]:  A: Severe sepsis  B: Hemodynamic instability  C: Endocarditis or evidence of metastatic infection  D: Erythema or exudate due to suppurative thrombophlebitis  E: Persistent bacteremia after 72 hours of antimicrobial therapy to which organism is susceptible.   Clin Infect Dis. 2009 July 1; 49(1): 1-45. 2. No need for PICC at this time.  3. When medically stable, patient is free from Oncology standpoint for discharge with appropriate outpatient antibiotics.   4. Will schedule patient for an outpatient [facility] Appointment.   Patient and plan discussed with Dr. Erline Hau and he is in agreement with the aforementioned.      LOS: 10 days    Nely Dedmon 08/06/2013

## 2013-08-07 ENCOUNTER — Ambulatory Visit (HOSPITAL_COMMUNITY): Payer: Medicare Other | Admitting: Oncology

## 2013-08-07 DIAGNOSIS — R609 Edema, unspecified: Secondary | ICD-10-CM

## 2013-08-07 LAB — CULTURE, BLOOD (ROUTINE X 2): Culture: NO GROWTH

## 2013-08-07 LAB — CBC WITH DIFFERENTIAL/PLATELET
Basophils Relative: 1 % (ref 0–1)
Eosinophils Absolute: 0.3 10*3/uL (ref 0.0–0.7)
Lymphs Abs: 2.2 10*3/uL (ref 0.7–4.0)
MCH: 29.3 pg (ref 26.0–34.0)
Neutro Abs: 8.6 10*3/uL — ABNORMAL HIGH (ref 1.7–7.7)
Neutrophils Relative %: 69 % (ref 43–77)
Platelets: 362 10*3/uL (ref 150–400)
RBC: 3.31 MIL/uL — ABNORMAL LOW (ref 3.87–5.11)

## 2013-08-07 LAB — GLUCOSE, CAPILLARY

## 2013-08-07 MED ORDER — LEVOFLOXACIN IN D5W 750 MG/150ML IV SOLN: 750.0000 mg | INTRAVENOUS | Status: AC

## 2013-08-07 MED ORDER — HEPARIN SOD (PORK) LOCK FLUSH 100 UNIT/ML IV SOLN: 500.0000 [IU] | Freq: Every day | INTRAVENOUS | Status: DC | PRN

## 2013-08-07 MED ORDER — FUROSEMIDE 40 MG PO TABS
40.0000 mg | ORAL_TABLET | Freq: Once | ORAL | Status: AC
  Administered 2013-08-07: 40 mg via ORAL
  Filled 2013-08-07: qty 1

## 2013-08-07 NOTE — Progress Notes (Signed)
This encounter was created in error - please disregard.

## 2013-08-07 NOTE — Discharge Summary (Signed)
[id number] 

## 2013-08-07 NOTE — Progress Notes (Signed)
I agree with the above. I spoke with Dr. Luciana Axe today and expressed to him that I do not feel strongly that removing the port is necessary ,given that blood cultures except for a coagulase-negative Staph on 08/03/2013 have been negative after the initial one grew Klebsiella pneumonia. He agrees with leaving the port in place. He recommended 10 days of oral Levaquin at 750 mg daily. Positive blood culture on 07/27/2013 was not drawn from the port . I did communicate this to Dr. Janna Arch.

## 2013-08-07 NOTE — Progress Notes (Signed)
Subjective: She is doing well. She reports LE edema that is new.  I have reviewed her fluid rate which I will decrease.  She denies any shortness of breath, dyspnea, and cough.  She reports that she walked a lot yesterday which is great.  We have spoken with Dr. Luciana Axe, ID, and he agrees that port-a-cath should remain, PICC line can be cancelled.  I have discussed this with Dr. Lovell Sheehan who agrees.  Dr. Sharia Reeve contacted Dr. Janna Arch to discuss this.  Objective: Vital signs in last 24 hours: Temp:  [98 F (36.7 C)-98.6 F (37 C)] 98.6 F (37 C) (09/11 0506) Pulse Rate:  [71-78] 78 (09/11 0506) Resp:  [18] 18 (09/11 0506) BP: (137-142)/(70-74) 137/74 mmHg (09/11 0506) SpO2:  [99 %-100 %] 99 % (09/11 0506) Weight:  [177 lb 11.1 oz (80.6 kg)] 177 lb 11.1 oz (80.6 kg) (09/11 0506)  Intake/Output from previous day: 09/10 0800 - 09/11 0759 In: 640 [P.O.:640] Out: -  Intake/Output this shift:    General appearance: alert, cooperative, appears stated age and no distress Resp: clear to auscultation bilaterally Cardio: regular rate and rhythm, S1, S2 normal, no murmur, click, rub or gallop GI: soft, non-tender; bowel sounds normal; no masses,  no organomegaly Extremities: edema B/L LE edema 1+ pitting Port: Intact without erythema, heat or pain.  Lab Results:   Recent Labs  08/06/13 0438 08/07/13 0457  WBC 15.5* 12.5*  HGB 10.3* 9.7*  HCT 30.9* 29.2*  PLT 365 362   BMET No results found for this basename: NA, K, CL, CO2, GLUCOSE, BUN, CREATININE, CALCIUM,  in the last 72 hours  Studies/Results: US Abdomen Limited  08/05/2013   *RADIOLOGY REPORT*  Clinical Data: Ascites.  Pancreatic cancer.  LIMITED ABDOMEN ULTRASOUND FOR ASCITES  Technique: Survey of the abdomen was performed  Comparison:  None.  Findings: Mild ascites in the left upper and lower quadrants.  Mild ascites about the liver.  Moderate ascites in the right lower quadrant.  IMPRESSION: Moderate ascites.   Original  Report Authenticated By: Jolaine Click, M.D.   US Paracentesis  08/05/2013   *RADIOLOGY REPORT*  ULTRASOUND GUIDED PARACENTESIS:  Clinical Data:  Metastatic pancreatic cancer, ascites  Technique: After explanation of procedure, benefits, and risks, written informed consent was obtained. Time-out protocol was followed. Collection of ascites in right lower quadrant localized by ultrasound. Skin prepped and draped in usual sterile fashion. Skin and soft tissues anesthestized with 8 ml of 1% lidocaine. 5-French Yueh catheter placed into peritoneal cavity. 2250 ml of clear yellow fluid aspirated by vacuum bottle suction. Procedure tolerated well by patient without immediate complication.  IMPRESSION: Ultrasound-guided paracentesis of 2250 ml of ascitic fluid.   Original Report Authenticated By: Ulyses Southward, M.D.    Medications: I have reviewed the patient's current medications.  Assessment: 1. Kleibsiella pneumoniae bacteremia, sensitive to Primaxin which she is on. Positive blood cultures from 07/27/2013 of right antecubital, 07/27/2013 of left hand, 07/28/2013 of right hand. Do not recommend port removal since patient has been afebrile since 07/28/2013 and is clinically improving. Additionally, the port is highly unlikely to be the source of this this gram negative infection, but we need to be observant for infection seeding of the catheter. Source of infection is more likely urinary or GI as this has been identified as Klebsiella bacteremia. If infection does not clear or recurs, then may consider port removal. Recommend outpatient treatment per infectious disease.  2. Gram + Cocci in clusters on blood culture from port-a-cath on 08/03/2013.  Likely contamination due to description of bacteria. Negative cultures since. Will D/C of Vancomycin 3. Metastatic pancreatic cancer, D18 of cycle 5 of FOLFIRINOX with Neulasta support. Images reviewed with radiologist. Patient has stable disease per CT scan.  3. Anemia, S/P 1  unit PRBC on 07/30/2013. Reactive to bacteremia with slow recovery secondary to chemotherapy.  4. Thrombocytopenia, resolved  5. Hypokalemia, resolved.  6. Recurrent ascites. Chemotherapy switched to Gemcitabine/Abraxane as this recurrent ascites may be indicative of progression. S/P US guided paracentesis on 07/31/2013 with removal of 2700 cc of yellow fluid and 08/05/2013 with removal of 2250 cc of yellow fluid.  7. Discussed case with Dr. Janna Arch, Dr. Luciana Axe, and Dr. Lovell Sheehan. All are in agreement.  8. LE edema, likely secondary to fluid overload.      PLAN: 1. Will D/C of Vancomycin  2. Will D/C fluids 3. Lasix 40 mg PO today 4.  Strongly discourage port-a-cath removal as she does not meet criteria for port removal according to Infectious Diseases Society of [place]:   A: Severe sepsis   B: Hemodynamic instability   C: Endocarditis or evidence of metastatic infection   D: Erythema or exudate due to suppurative thrombophlebitis   E: Persistent bacteremia after 72 hours of antimicrobial therapy to which organism is susceptible.  Clin Infect Dis. 2009 July 1; 49(1): 1-45. 5. Cancel PICC 6. Follow-up appointment at [facility] on 08/12/2013. 7. When medically stable, patient is free from Oncology standpoint for discharge with appropriate outpatient antibiotics.    Patient and plan discussed with Dr. Erline Hau and he is in agreement with the aforementioned.      LOS: 11 days    Lamira Borin 08/07/2013

## 2013-08-07 NOTE — Progress Notes (Signed)
Pt discharged home today with [facility] per Dr. Janna Arch. Pt's PORT-A-Cath remains accessed at discharge per MD order for Home IV ABX use.  Pt made aware of this. Verbalized understanding. Pt provided with home medication list, discharge instructions and prescriptions. Verbalized understanding. Pt ambulated off floor in stable condition accompanied by NT.

## 2013-08-08 NOTE — Discharge Summary (Signed)
[name], [name]                ACCOUNT NO.:  [id number]  MEDICAL RECORD NO.:  [id number]  LOCATION:  A337                          FACILITY:  [facility]  PHYSICIAN:  Melvyn Novas, MDDATE OF BIRTH:  [birth year]  DATE OF ADMISSION:  07/27/2013 DATE OF DISCHARGE:  09/11/2014LH                              DISCHARGE SUMMARY   The patient is a 67 year old black female with stage IV pancreatic carcinoma with metastatic disease to the biliary tree as well as the spleen.  The patient was admitted, she had fever, chills, was found to have septicemia, which grew out gram-negative Klebsiella pneumonia. This was treated successfully with imipenem IV and she defervesced.  We continued to do blood cultures throughout her hospital stay and most were negative except 48 hours before admission a gram-positive coagulase negative cocci grew out in clusters.  There was much discussion between Infectious Disease, Hematology, Surgery, and myself.  It was agreed by consensus especially by ID and Hematology that this was a contaminant, the patient had no other clinical stigmata of bacteremia with positive cocci.  There was also discussion as to removal of catheter, which was bandied back and forth, ultimate decision between ID and Hematology/Oncology was that the catheter did not meet criteria for removal and would be utilized for chemotherapy as well as for outpatient intravenous antibiotic therapy, which would include Levaquin 750 IV for an additional 10 days.  She likewise had insulin-dependent diabetes with good glycemic control, has hypertension with good hemodynamic control. She is relatively asymptomatic since her second day in hospital.  She was felt to be discharged on the following medicines: 1. Xanax 1 mg p.o. t.i.d. p.r.n. 2. Norvasc 5 mg p.o. daily. 3. Decadron 8 mg p.o. a.m. and 4 mg p.o. p.m. 4. Gemzar IV per Hematology/Oncology. 5. Lantus insulin 20 units subcu nightly. 6. Ativan  0.5 mg p.o. t.i.d. p.r.n. 7. Cozaar 100 mg p.o. daily. 8. Prilosec 20 mg p.o. daily. 9. Abraxane IV chemo per Hematology in clinic. 10.MiraLax powder 17 g p.o. daily p.r.n. 11.Potassium chloride 20 mEq p.o. daily in addition to Levaquin 750 mg     IV for 10 additional days starting tomorrow.  Home health was set up for this purposes.  The patient should have face-to-face contact with home health according to [facility] protocols and IV Levaquin administration.     Melvyn Novas, MD     RMD/MEDQ  D:  08/07/2013  T:  08/07/2013  Job:  [id number]

## 2013-08-10 LAB — CULTURE, BLOOD (ROUTINE X 2)

## 2013-08-11 ENCOUNTER — Inpatient Hospital Stay (HOSPITAL_COMMUNITY): Payer: Medicare Other

## 2013-08-12 ENCOUNTER — Ambulatory Visit (HOSPITAL_COMMUNITY): Payer: Medicare Other

## 2013-08-12 ENCOUNTER — Ambulatory Visit (HOSPITAL_COMMUNITY): Payer: Medicare Other | Admitting: Oncology

## 2013-08-13 ENCOUNTER — Encounter (HOSPITAL_COMMUNITY): Payer: Medicare Other

## 2013-08-18 ENCOUNTER — Encounter (HOSPITAL_COMMUNITY): Payer: Medicare Other | Attending: Oncology

## 2013-08-18 ENCOUNTER — Ambulatory Visit (HOSPITAL_COMMUNITY)
Admission: RE | Admit: 2013-08-18 | Discharge: 2013-08-18 | Disposition: A | Discharge status: Home / Self Care | Payer: Medicare Other | Source: Ambulatory Visit | Attending: Hematology and Oncology | Admitting: Hematology and Oncology

## 2013-08-18 ENCOUNTER — Encounter (HOSPITAL_BASED_OUTPATIENT_CLINIC_OR_DEPARTMENT_OTHER): Payer: Medicare Other

## 2013-08-18 ENCOUNTER — Inpatient Hospital Stay (HOSPITAL_COMMUNITY): Payer: Medicare Other

## 2013-08-18 ENCOUNTER — Encounter (HOSPITAL_COMMUNITY): Payer: Self-pay

## 2013-08-18 ENCOUNTER — Other Ambulatory Visit (HOSPITAL_COMMUNITY): Payer: Self-pay | Admitting: Hematology and Oncology

## 2013-08-18 VITALS — BP 136/82 | HR 87 | Temp 98.1°F | Resp 16 | Wt 154.4 lb

## 2013-08-18 DIAGNOSIS — Y849 Medical procedure, unspecified as the cause of abnormal reaction of the patient, or of later complication, without mention of misadventure at the time of the procedure: Secondary | ICD-10-CM | POA: Insufficient documentation

## 2013-08-18 DIAGNOSIS — C259 Malignant neoplasm of pancreas, unspecified: Secondary | ICD-10-CM

## 2013-08-18 DIAGNOSIS — R609 Edema, unspecified: Secondary | ICD-10-CM | POA: Insufficient documentation

## 2013-08-18 DIAGNOSIS — Z5111 Encounter for antineoplastic chemotherapy: Secondary | ICD-10-CM

## 2013-08-18 DIAGNOSIS — C787 Secondary malignant neoplasm of liver and intrahepatic bile duct: Secondary | ICD-10-CM

## 2013-08-18 DIAGNOSIS — Z452 Encounter for adjustment and management of vascular access device: Secondary | ICD-10-CM

## 2013-08-18 DIAGNOSIS — T82898A Other specified complication of vascular prosthetic devices, implants and grafts, initial encounter: Secondary | ICD-10-CM | POA: Insufficient documentation

## 2013-08-18 LAB — CBC WITH DIFFERENTIAL/PLATELET
Eosinophils Relative: 2 % (ref 0–5)
HCT: 29.4 % — ABNORMAL LOW (ref 36.0–46.0)
Hemoglobin: 9.4 g/dL — ABNORMAL LOW (ref 12.0–15.0)
Lymphocytes Relative: 21 % (ref 12–46)
Lymphs Abs: 2.1 10*3/uL (ref 0.7–4.0)
MCV: 90.2 fL (ref 78.0–100.0)
Monocytes Absolute: 1.5 10*3/uL — ABNORMAL HIGH (ref 0.1–1.0)
RBC: 3.26 MIL/uL — ABNORMAL LOW (ref 3.87–5.11)
WBC: 10.2 10*3/uL (ref 4.0–10.5)

## 2013-08-18 LAB — COMPREHENSIVE METABOLIC PANEL
AST: 22 U/L (ref 0–37)
CO2: 26 mEq/L (ref 19–32)
Chloride: 100 mEq/L (ref 96–112)
Creatinine, Ser: 1 mg/dL (ref 0.50–1.10)
GFR calc non Af Amer: 58 mL/min — ABNORMAL LOW (ref >90)
Total Bilirubin: 0.4 mg/dL (ref 0.3–1.2)

## 2013-08-18 MED ORDER — HEPARIN SOD (PORK) LOCK FLUSH 100 UNIT/ML IV SOLN: 500.0000 [IU] | Freq: Once | INTRAVENOUS | Status: DC | PRN

## 2013-08-18 MED ORDER — ALTEPLASE 2 MG IJ SOLR
2.0000 mg | Freq: Once | INTRAMUSCULAR | Status: DC
  Administered 2013-08-18: 2 mg
  Filled 2013-08-18: qty 2

## 2013-08-18 MED ORDER — SODIUM CHLORIDE 0.9 % IV SOLN
Freq: Once | INTRAVENOUS | Status: AC
  Administered 2013-08-18: 11:00:00 via INTRAVENOUS

## 2013-08-18 MED ORDER — SODIUM CHLORIDE 0.9 % IV SOLN: 8.0000 mg | Freq: Once | INTRAVENOUS | Status: DC

## 2013-08-18 MED ORDER — ONDANSETRON HCL 40 MG/20ML IJ SOLN
Freq: Once | INTRAMUSCULAR | Status: AC
  Administered 2013-08-18: 8 mg via INTRAVENOUS
  Filled 2013-08-18: qty 4

## 2013-08-18 MED ORDER — SODIUM CHLORIDE 0.9 % IV SOLN
1000.0000 mg/m2 | Freq: Once | INTRAVENOUS | Status: AC
  Administered 2013-08-18: 1900 mg via INTRAVENOUS
  Filled 2013-08-18: qty 49.97

## 2013-08-18 MED ORDER — SODIUM CHLORIDE 0.9 % IJ SOLN: 10.0000 mL | INTRAMUSCULAR | Status: DC | PRN

## 2013-08-18 MED ORDER — DEXAMETHASONE SODIUM PHOSPHATE 10 MG/ML IJ SOLN: 10.0000 mg | Freq: Once | INTRAMUSCULAR | Status: DC

## 2013-08-18 MED ORDER — PACLITAXEL PROTEIN-BOUND CHEMO INJECTION 100 MG
125.0000 mg/m2 | Freq: Once | INTRAVENOUS | Status: AC
  Administered 2013-08-18: 225 mg via INTRAVENOUS
  Filled 2013-08-18: qty 45

## 2013-08-18 MED ORDER — STERILE WATER FOR INJECTION IJ SOLN
INTRAMUSCULAR | Status: AC
  Filled 2013-08-18: qty 10

## 2013-08-18 NOTE — Patient Instructions (Addendum)
[facility] Discharge Instructions for Patients Receiving Chemotherapy  Today you received the following chemotherapy agents abraxane and gemcitabine  To help prevent nausea and vomiting after your treatment, we encourage you to take your nausea medication as prescribed. Return tomorrow for removal of alteplase. If you develop nausea and vomiting that is not controlled by your nausea medication, call the clinic. If it is after clinic hours your family physician or the after hours number for the clinic or go to the Emergency Department.   BELOW ARE SYMPTOMS THAT SHOULD BE REPORTED IMMEDIATELY:  *FEVER GREATER THAN 101.0 F  *CHILLS WITH OR WITHOUT FEVER  NAUSEA AND VOMITING THAT IS NOT CONTROLLED WITH YOUR NAUSEA MEDICATION  *UNUSUAL SHORTNESS OF BREATH  *UNUSUAL BRUISING OR BLEEDING  TENDERNESS IN MOUTH AND THROAT WITH OR WITHOUT PRESENCE OF ULCERS  *URINARY PROBLEMS  *BOWEL PROBLEMS  UNUSUAL RASH Items with * indicate a potential emergency and should be followed up as soon as possible.  One of the nurses will contact you 24 hours after your treatment. Please let the nurse know about any problems that you may have experienced. Feel free to call the clinic you have any questions or concerns. The clinic phone number is [phone].   I have been informed and understand all the instructions given to me. I know to contact the clinic, my physician, or go to the Emergency Department if any problems should occur. I do not have any questions at this time, but understand that I may call the clinic during office hours or the Patient Navigator at [phone] should I have any questions or need assistance in obtaining follow up care.    __________________________________________  _____________  __________ Signature of Patient or Authorized Representative            Date                   Time    __________________________________________ Nurse's Signature

## 2013-08-18 NOTE — Addendum Note (Signed)
Addended by: Alla German A on: 08/18/2013 01:58 PM   Modules accepted: Orders

## 2013-08-18 NOTE — Progress Notes (Signed)
Alteplase per protocol due to no blood return from port. Patient to return tomorrow for removal.

## 2013-08-18 NOTE — Progress Notes (Signed)
[facility] OFFICE PROGRESS NOTE  [name], MD [address] [place] [address]  DIAGNOSIS: Malignant neoplasm of pancreas, part unspecified - Plan: CBC with Differential, Basic metabolic panel, Magnesium  Pancreatic cancer metastasized to liver  Chief Complaint  Patient presents with  . Pancreatic Cancer    CURRENT THERAPY: Beginning cycle 1 of Abraxane/Gemzar.  INTERVAL HISTORY: [name] 67 y.o. female returns for initiation of salvage chemotherapy for stage IV pancreatic cancer with recurrent ascites, status post 5 cycles of FOLFIRINOX, status post paracentesis on 08/07/2013 with repeat CT scan done on 07/28/2013 showing only minimal response. She has not experienced any significant worsening of her abdominal swelling but does admit to ankle swelling. She denies any fever, night sweats, PND, orthopnea, palpitations, headache, skin rash, epistaxis, melena, or hematochezia. She also denies any nausea, vomiting, diarrhea, constipation, or peripheral paresthesias.   MEDICAL HISTORY: Past Medical History  Diagnosis Date  . Diabetes mellitus without complication   . Hypertension   . Anxiety   . Arthritis   . Pancreatic cancer     INTERIM HISTORY: has Pancreatic mass; Obstructive jaundice; Hypokalemia; Dehydration; Tobacco abuse; HTN (hypertension); Diabetes mellitus; Malignant neoplasm of pancreas, part unspecified; Diabetes type 2, uncontrolled; Fever, unspecified; and Diarrhea on her problem list.    ALLERGIES:  has No Known Allergies.  MEDICATIONS: has a current medication list which includes the following prescription(s): alprazolam, amlodipine, dexamethasone, gemcitabine hcl, heparin lock flush, hydrocodone-acetaminophen, insulin glargine, lorazepam, losartan, paclitaxel protein-bound part, polyethylene glycol powder, potassium chloride sa, levofloxacin, and omeprazole.  SURGICAL HISTORY:  Past Surgical History  Procedure  Laterality Date  . Abdominal hysterectomy    . Colonoscopy  2006    Dr. Jena Gauss, polyp, benign  . Ercp  05/10/2013    Procedure: ENDOSCOPIC RETROGRADE CHOLANGIOPANCREATOGRAPHY (ERCP)(DIFFICULT CANNULATION) ;  Surgeon: Malissa Hippo, MD;  Location: [facility];  Service: Endoscopy;;  . Biliary stent placement  05/10/2013    Procedure: BILIARY STENT PLACEMENT ( X WALLFLEX STENT);  Surgeon: Malissa Hippo, MD;  Location: [facility];  Service: Endoscopy;;  . Sphincterotomy  05/10/2013    Procedure: SPHINCTEROTOMY (PRECUT MADE WITH NEEDLE KNIFE);  Surgeon: Malissa Hippo, MD;  Location: [facility];  Service: Endoscopy;;  . Liver biopsy  05/12/2013  . Portacath placement Left 05/23/2013    Procedure: INSERTION PORT-A-CATH;  Surgeon: Fabio Bering, MD;  Location: [facility];  Service: General;  Laterality: Left;    FAMILY HISTORY: family history includes Bone cancer in her brother; Cancer in her brother; Hypertension in her mother. There is no history of Colon cancer or Liver disease.  SOCIAL HISTORY:  reports that she has quit smoking. She quit smokeless tobacco use about 4 months ago. She reports that she does not drink alcohol or use illicit drugs.  REVIEW OF SYSTEMS:  Other than that discussed above is noncontributory.  PHYSICAL EXAMINATION: ECOG PERFORMANCE STATUS: 1 - Symptomatic but completely ambulatory  Blood pressure 136/82, pulse 87, temperature 98.1 F (36.7 C), temperature source Oral, resp. rate 16, weight 154 lb 6.4 oz (70.035 kg).  GENERAL:alert, no distress and comfortable. Alopecia. SKIN: skin color, texture, turgor are normal, no rashes or significant lesions EYES: normal, Conjunctiva are pink and non-injected, sclera clear OROPHARYNX:no exudate, no erythema and lips, buccal mucosa, and tongue normal  NECK: supple, thyroid normal size, non-tender, without nodularity CHEST: Normal AP diameter with no breast masses. LYMPH:  no palpable lymphadenopathy in the cervical, axillary or  inguinal  LUNGS: clear to auscultation and percussion with normal breathing effort HEART: regular rate & rhythm and no murmurs and no lower extremity edema ABDOMEN: Distended with a positive fluid wave and shifting dullness. No rebound tenderness. No organs ballotable. Musculoskeletal:no cyanosis of digits and no clubbing  NEURO: alert & oriented x 3 with fluent speech, no focal motor/sensory deficits. No evidence of asterixis.   LABORATORY DATA: Admission on 07/27/2013, Discharged on 08/07/2013  No results displayed because visit has over 200 results.    Infusion on 08/05/2013  Component Date Value Range Status  . Glucose-Capillary 08/05/2013 57* 70 - 99 mg/dL Final  . Glucose-Capillary 08/05/2013 79  70 - 99 mg/dL Final  . Glucose-Capillary 08/05/2013 105* 70 - 99 mg/dL Final  . Comment 1 78/29/5621 Notify RN   Final  . Comment 2 08/05/2013 Documented in Chart   Final  . Glucose-Capillary 08/06/2013 166* 70 - 99 mg/dL Final  . Comment 1 30/86/5784 Notify RN   Final  . Comment 2 08/06/2013 Documented in Chart   Final  . Glucose-Capillary 08/06/2013 180* 70 - 99 mg/dL Final  . Comment 1 69/62/9528 Notify RN   Final  . Comment 2 08/06/2013 Documented in Chart   Final  . Glucose-Capillary 08/06/2013 155* 70 - 99 mg/dL Final  . Comment 1 41/32/4401 Notify RN   Final  . Comment 2 08/06/2013 Documented in Chart   Final  . Glucose-Capillary 08/06/2013 126* 70 - 99 mg/dL Final  . Comment 1 02/72/5366 Notify RN   Final  . Comment 2 08/06/2013 Documented in Chart   Final  . Glucose-Capillary 08/07/2013 125* 70 - 99 mg/dL Final  . Comment 1 44/01/4741 Notify RN   Final  . Comment 2 08/07/2013 Documented in Chart   Final  . Glucose-Capillary 08/07/2013 137* 70 - 99 mg/dL Final  . Comment 1 59/56/3875 Notify RN   Final  . Comment 2 08/07/2013 Documented in Chart   Final  Infusion on 07/23/2013  Component Date Value Range Status  . WBC 07/23/2013 22.5* 4.0 - 10.5 K/uL Final  . RBC  07/23/2013 3.32* 3.87 - 5.11 MIL/uL Final  . Hemoglobin 07/23/2013 9.7* 12.0 - 15.0 g/dL Final  . HCT 64/33/2951 29.2* 36.0 - 46.0 % Final  . MCV 07/23/2013 88.0  78.0 - 100.0 fL Final  . MCH 07/23/2013 29.2  26.0 - 34.0 pg Final  . MCHC 07/23/2013 33.2  30.0 - 36.0 g/dL Final  . RDW 88/41/6606 17.7* 11.5 - 15.5 % Final  . Platelets 07/23/2013 202  150 - 400 K/uL Final  . Neutrophils Relative % 07/23/2013 94* 43 - 77 % Final  . Neutro Abs 07/23/2013 21.0* 1.7 - 7.7 K/uL Final  . Lymphocytes Relative 07/23/2013 4* 12 - 46 % Final  . Lymphs Abs 07/23/2013 0.9  0.7 - 4.0 K/uL Final  . Monocytes Relative 07/23/2013 2* 3 - 12 % Final  . Monocytes Absolute 07/23/2013 0.5  0.1 - 1.0 K/uL Final  . Eosinophils Relative 07/23/2013 0  0 - 5 % Final  . Eosinophils Absolute 07/23/2013 0.0  0.0 - 0.7 K/uL Final  . Basophils Relative 07/23/2013 0  0 - 1 % Final  . Basophils Absolute 07/23/2013 0.0  0.0 - 0.1 K/uL Final  . Sodium 07/23/2013 135  135 - 145 mEq/L Final  . Potassium 07/23/2013 4.2  3.5 - 5.1 mEq/L Final  . Chloride 07/23/2013 99  96 - 112 mEq/L Final  . CO2 07/23/2013 25  19 - 32 mEq/L Final  .  Glucose, Bld 07/23/2013 108* 70 - 99 mg/dL Final  . BUN 16/60/6301 26* 6 - 23 mg/dL Final  . Creatinine, Ser 07/23/2013 1.19* 0.50 - 1.10 mg/dL Final  . Calcium 60/08/9322 9.0  8.4 - 10.5 mg/dL Final  . Total Protein 07/23/2013 6.8  6.0 - 8.3 g/dL Final  . Albumin 55/73/2202 2.4* 3.5 - 5.2 g/dL Final  . AST 54/27/0623 143* 0 - 37 U/L Final  . ALT 07/23/2013 82* 0 - 35 U/L Final  . Alkaline Phosphatase 07/23/2013 332* 39 - 117 U/L Final  . Total Bilirubin 07/23/2013 1.0  0.3 - 1.2 mg/dL Final  . GFR calc non Af Amer 07/23/2013 47* >90 mL/min Final  . GFR calc Af Amer 07/23/2013 54* >90 mL/min Final   Comment: (NOTE)                          The eGFR has been calculated using the CKD EPI equation.                          This calculation has not been validated in all clinical situations.                           eGFR's persistently <90 mL/min signify possible Chronic Kidney                          Disease.  Infusion on 07/21/2013  Component Date Value Range Status  . WBC 07/21/2013 20.7* 4.0 - 10.5 K/uL Final  . RBC 07/21/2013 3.04* 3.87 - 5.11 MIL/uL Final  . Hemoglobin 07/21/2013 8.8* 12.0 - 15.0 g/dL Final  . HCT 76/28/3151 27.2* 36.0 - 46.0 % Final  . MCV 07/21/2013 89.5  78.0 - 100.0 fL Final  . MCH 07/21/2013 28.9  26.0 - 34.0 pg Final  . MCHC 07/21/2013 32.4  30.0 - 36.0 g/dL Final  . RDW 76/16/0737 17.8* 11.5 - 15.5 % Final  . Platelets 07/21/2013 194  150 - 400 K/uL Final  . Neutrophils Relative % 07/21/2013 74  43 - 77 % Final  . Neutro Abs 07/21/2013 15.4* 1.7 - 7.7 K/uL Final  . Lymphocytes Relative 07/21/2013 15  12 - 46 % Final  . Lymphs Abs 07/21/2013 3.1  0.7 - 4.0 K/uL Final  . Monocytes Relative 07/21/2013 7  3 - 12 % Final  . Monocytes Absolute 07/21/2013 1.5* 0.1 - 1.0 K/uL Final  . Eosinophils Relative 07/21/2013 3  0 - 5 % Final  . Eosinophils Absolute 07/21/2013 0.7  0.0 - 0.7 K/uL Final  . Basophils Relative 07/21/2013 0  0 - 1 % Final  . Basophils Absolute 07/21/2013 0.1  0.0 - 0.1 K/uL Final  . Sodium 07/21/2013 138  135 - 145 mEq/L Final  . Potassium 07/21/2013 3.3* 3.5 - 5.1 mEq/L Final  . Chloride 07/21/2013 105  96 - 112 mEq/L Final  . CO2 07/21/2013 24  19 - 32 mEq/L Final  . Glucose, Bld 07/21/2013 182* 70 - 99 mg/dL Final  . BUN 10/62/6948 14  6 - 23 mg/dL Final  . Creatinine, Ser 07/21/2013 1.12* 0.50 - 1.10 mg/dL Final  . Calcium 54/62/7035 8.5  8.4 - 10.5 mg/dL Final  . Total Protein 07/21/2013 6.2  6.0 - 8.3 g/dL Final  . Albumin 00/93/8182 2.2* 3.5 - 5.2 g/dL Final  . AST 99/37/1696 21  0 - 37 U/L Final  . ALT 07/21/2013 16  0 - 35 U/L Final  . Alkaline Phosphatase 07/21/2013 184* 39 - 117 U/L Final  . Total Bilirubin 07/21/2013 0.3  0.3 - 1.2 mg/dL Final  . GFR calc non Af Amer 07/21/2013 50* >90 mL/min Final  . GFR calc Af  Amer 07/21/2013 58* >90 mL/min Final   Comment: (NOTE)                          The eGFR has been calculated using the CKD EPI equation.                          This calculation has not been validated in all clinical situations.                          eGFR's persistently <90 mL/min signify possible Chronic Kidney                          Disease.     Urinalysis    Component Value Date/Time   COLORURINE YELLOW 07/27/2013 1947   APPEARANCEUR CLEAR 07/27/2013 1947   LABSPEC 1.015 07/27/2013 1947   PHURINE 6.0 07/27/2013 1947   GLUCOSEU 500* 07/27/2013 1947   HGBUR SMALL* 07/27/2013 1947   BILIRUBINUR NEGATIVE 07/27/2013 1947   KETONESUR NEGATIVE 07/27/2013 1947   PROTEINUR TRACE* 07/27/2013 1947   UROBILINOGEN 1.0 07/27/2013 1947   NITRITE NEGATIVE 07/27/2013 1947   LEUKOCYTESUR NEGATIVE 07/27/2013 1947    RADIOGRAPHIC STUDIES: Dg Chest 2 View  07/27/2013   *RADIOLOGY REPORT*  Clinical Data: Weakness and fever.  Recent chemotherapy. Pancreatic cancer.  CHEST - 2 VIEW  Comparison: Pleural chest radiograph 05/23/2013  Findings: Heart size is upper normal.  Mediastinal and hilar contours are stable.  Left subclavian power Port-A-Cath is present. Since the chest radiograph of 05/23/2013, the catheter appears to have been slightly retracted, and now curves posteriorly, as best seen on the lateral view, with the distal catheter in the expected location of the azygos vein.  The lungs are normally expanded and clear.  No focal airspace opacity, mass, or pulmonary edema.  Negative for pleural effusion or pneumothorax.  No acute osseous abnormality.  IMPRESSION: 1. The distal tip of the left subclavian Port-A-Cath has changed in position since prior chest radiograph and is in the expected location of the azygos vein.  This finding was called to Dr. Lynelle Doctor 7:40 p.m. 07/27/2013. 2.  No acute cardiopulmonary disease identified.   Original Report Authenticated By: Britta Mccreedy, M.D.   Ct Abdomen Pelvis W  Contrast  07/28/2013   *RADIOLOGY REPORT*  Clinical Data: Fever and diarrhea.  CT ABDOMEN AND PELVIS WITH CONTRAST  Technique:  Multidetector CT imaging of the abdomen and pelvis was performed following the standard protocol during bolus administration of intravenous contrast.  Contrast: OMNIPAQUE IOHEXOL 300 MG/ML  SOLN, 50mL OMNIPAQUE IOHEXOL 300 MG/ML  SOLN  Comparison: 05/07/2013.  Findings:  BODY WALL: Unremarkable.  LOWER CHEST:  Mediastinum: Mild cardiomegaly.  Lungs/pleura: No consolidation.  There is a spiculated 1 cm nodule in the medial right base which is slightly smaller than before (prior 12mm), although there is motion.  ABDOMEN/PELVIS:  Liver: Diffuse hepatic metastatic disease.  There is likely a response to treatment: the largest mass, subcapsular anterior right lobe, now measures 4.5 cm compared to 6  cm previously.  None of these contains new gas to suggest infection.  Biliary: Interval placement of metallic biliary stent.  Stent appears well positioned.  Left-sided ducts are distended, but filled with gas.  Right-sided defects are less dilated than prior. Overall, no evidence for stent obstruction.  Pancreas: Large pancreatic tail mass is again seen, slightly less bulky, but still measuring 7 x 5 cm in axial span.  As before there is direct invasion into the spleen, left upper pole kidney, and left adrenal gland.  Spleen: Direct invasion of the hilum by pancreatic tail mass. There are new wedge shaped areas of hypo enhancement within the peripheral spleen, consistent with infarcts.  Adrenals: Indistinct plane between the left adrenal gland and pancreatic tail mass.  Kidneys and ureters: Ingrowth into the left kidney, as before.  No acute change such as urinary obstruction.  2.5 cm cyst in the left upper pole is again seen.  Bladder: Unremarkable.  Bowel: No obstruction.  No pericecal inflammatory change.  Retroperitoneum: No change in retroperitoneal adenopathy, with left periaortic node at  the level of the left renal hilum still measuring 2.7 x 1.6 cm.  Peritoneum: Small-volume ascites, mainly around the liver.  Omental nodules are again seen, more bulky - especially in the supravesicular region.  Reproductive: Unremarkable.  Vascular: No acute abnormality.  OSSEOUS: No acute abnormalities. No suspicious lytic or blastic lesions.  IMPRESSION:  1.  No definite intra-abdominal infection. 2. Widely metastatic pancreatic adenocarcinoma. Right lower lung and hepatic metastatic disease shows modest response to therapy. However, peritoneal metastatic disease shows increasing bulk. 3.  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   Original Report Authenticated By: Tiburcio Pea   US Abdomen Limited  08/05/2013   *RADIOLOGY REPORT*  Clinical Data: Ascites.  Pancreatic cancer.  LIMITED ABDOMEN ULTRASOUND FOR ASCITES  Technique: Survey of the abdomen was performed  Comparison:  None.  Findings: Mild ascites in the left upper and lower quadrants.  Mild ascites about the liver.  Moderate ascites in the right lower quadrant.  IMPRESSION: Moderate ascites.   Original Report Authenticated By: Jolaine Click, M.D.   US Abdomen Limited  08/01/2013   *RADIOLOGY REPORT*  Clinical Data: Evaluate for ascites  LIMITED ABDOMEN ULTRASOUND FOR ASCITES  Technique: Scanning over all four quadrants of the abdomen was performed in  Comparison:  None.  Findings: Moderate ascites is present in all four quadrants of the abdomen.  IMPRESSION: Moderate ascites.   Original Report Authenticated By: Jolaine Click, M.D.   US Paracentesis  08/05/2013   *RADIOLOGY REPORT*  ULTRASOUND GUIDED PARACENTESIS:  Clinical Data:  Metastatic pancreatic cancer, ascites  Technique: After explanation of procedure, benefits, and risks, written informed consent was obtained. Time-out protocol was followed. Collection of ascites in right lower quadrant localized by ultrasound. Skin prepped and  draped in usual sterile fashion. Skin and soft tissues anesthestized with 8 ml of 1% lidocaine. 5-French Yueh catheter placed into peritoneal cavity. 2250 ml of clear yellow fluid aspirated by vacuum bottle suction. Procedure tolerated well by patient without immediate complication.  IMPRESSION: Ultrasound-guided paracentesis of 2250 ml of ascitic fluid.   Original Report Authenticated By: Ulyses Southward, M.D.   US Paracentesis  08/01/2013   CLINICAL DATA:  Metastatic pancreatic cancer. Ascites.  EXAM: ULTRASOUND GUIDED PARACENTESIS  COMPARISON:  None.  FINDINGS: A total of approximately 2.7 L of clear yellow fluid was removed. A fluid sample was sent for laboratory analysis.  IMPRESSION:  Successful ultrasound guided paracentesis yielding 2.7 L of ascites.   Electronically Signed   By: Charlett Nose M.D.   On: 08/01/2013 15:24    ASSESSMENT: #1. Pancreatic carcinoma with liver metastases and ascites, status post 5 cycles of FOLFIRINOX with minimal response on repeat CT scan, for institution of salvage therapy with Gemzar/Abraxane. #2. Recurrent ascites with lower extremity edema, reinstitute diuretic therapy she has at home. #3 small midline ventral hernia, easily reducible   PLAN: #1. If blood counts permit, and cycle 1, day 1 of Abraxane/Gemzar. #2. Resume diuretic therapy. #3. Day 8 treatment in one week with office visit. #4. The patient expressed understanding of this strategy.   All questions were answered. The patient knows to call the clinic with any problems, questions or concerns. We can certainly see the patient much sooner if necessary.  The patient and plan discussed with Alla German A and he is in agreement with the aforementioned.  I spent 30 minutes counseling the patient face to face. The total time spent in the appointment was 25 minutes.    Maurilio Lovely, MD 08/18/2013 10:07 AM

## 2013-08-18 NOTE — Patient Instructions (Addendum)
[facility] Discharge Instructions  RECOMMENDATIONS MADE BY THE CONSULTANT AND ANY TEST RESULTS WILL BE SENT TO YOUR REFERRING PHYSICIAN.  EXAM FINDINGS BY THE PHYSICIAN TODAY AND SIGNS OR SYMPTOMS TO REPORT TO CLINIC OR PRIMARY PHYSICIAN: Exam and findings as discussed by Dr. Zigmund Daniel.  Report uncontrolled nausea, vomiting or other problems.  MEDICATIONS PRESCRIBED:  Resume your fluid pills  INSTRUCTIONS/FOLLOW-UP: Follow-up in 1 week.  Thank you for choosing [name] to provide your oncology and hematology care.  To afford each patient quality time with our providers, please arrive at least 15 minutes before your scheduled appointment time.  With your help, our goal is to use those 15 minutes to complete the necessary work-up to ensure our physicians have the information they need to help with your evaluation and healthcare recommendations.    Effective January 1st, 2014, we ask that you re-schedule your appointment with our physicians should you arrive 10 or more minutes late for your appointment.  We strive to give you quality time with our providers, and arriving late affects you and other patients whose appointments are after yours.    Again, thank you for choosing [facility].  Our hope is that these requests will decrease the amount of time that you wait before being seen by our physicians.       _____________________________________________________________  Should you have questions after your visit to [facility], please contact our office at [phone] between the hours of 8:30 a.m. and 5:00 p.m.  Voicemails left after 4:30 p.m. will not be returned until the following business day.  For prescription refill requests, have your pharmacy contact our office with your prescription refill request.

## 2013-08-19 ENCOUNTER — Encounter (HOSPITAL_BASED_OUTPATIENT_CLINIC_OR_DEPARTMENT_OTHER): Payer: Medicare Other

## 2013-08-19 DIAGNOSIS — C259 Malignant neoplasm of pancreas, unspecified: Secondary | ICD-10-CM

## 2013-08-19 DIAGNOSIS — R609 Edema, unspecified: Secondary | ICD-10-CM

## 2013-08-19 DIAGNOSIS — Z452 Encounter for adjustment and management of vascular access device: Secondary | ICD-10-CM

## 2013-08-19 MED ORDER — SODIUM CHLORIDE 0.9 % IJ SOLN
10.0000 mL | INTRAMUSCULAR | Status: DC | PRN
  Administered 2013-08-19: 10 mL via INTRAVENOUS

## 2013-08-19 MED ORDER — HEPARIN SOD (PORK) LOCK FLUSH 100 UNIT/ML IV SOLN
500.0000 [IU] | Freq: Once | INTRAVENOUS | Status: AC
  Administered 2013-08-19: 500 [IU] via INTRAVENOUS

## 2013-08-19 MED ORDER — HEPARIN SOD (PORK) LOCK FLUSH 100 UNIT/ML IV SOLN
INTRAVENOUS | Status: AC
  Filled 2013-08-19: qty 5

## 2013-08-19 MED ORDER — FUROSEMIDE 40 MG PO TABS: 40.0000 mg | ORAL_TABLET | Freq: Every day | ORAL | Status: DC

## 2013-08-19 NOTE — Progress Notes (Signed)
Alteplace removed with 10 ml blood.  Flushed with ns 10 ml and heparin 500 units.  Tolerated well.

## 2013-08-20 ENCOUNTER — Encounter (HOSPITAL_COMMUNITY): Payer: Medicare Other

## 2013-08-25 ENCOUNTER — Encounter (HOSPITAL_BASED_OUTPATIENT_CLINIC_OR_DEPARTMENT_OTHER): Payer: Medicare Other

## 2013-08-25 ENCOUNTER — Encounter (HOSPITAL_COMMUNITY): Payer: Medicare Other

## 2013-08-25 VITALS — BP 173/89 | HR 101 | Temp 98.4°F | Resp 16 | Wt 147.0 lb

## 2013-08-25 DIAGNOSIS — C259 Malignant neoplasm of pancreas, unspecified: Secondary | ICD-10-CM

## 2013-08-25 DIAGNOSIS — Z5111 Encounter for antineoplastic chemotherapy: Secondary | ICD-10-CM

## 2013-08-25 DIAGNOSIS — C787 Secondary malignant neoplasm of liver and intrahepatic bile duct: Secondary | ICD-10-CM

## 2013-08-25 LAB — BASIC METABOLIC PANEL
GFR calc Af Amer: 80 mL/min — ABNORMAL LOW (ref >90)
GFR calc non Af Amer: 69 mL/min — ABNORMAL LOW (ref >90)
Potassium: 3.8 mEq/L (ref 3.5–5.1)
Sodium: 134 mEq/L — ABNORMAL LOW (ref 135–145)

## 2013-08-25 LAB — MAGNESIUM: Magnesium: 1.5 mg/dL (ref 1.5–2.5)

## 2013-08-25 LAB — CBC WITH DIFFERENTIAL/PLATELET
Basophils Absolute: 0 10*3/uL (ref 0.0–0.1)
Basophils Relative: 0 % (ref 0–1)
Eosinophils Absolute: 0.5 10*3/uL (ref 0.0–0.7)
Lymphs Abs: 1.4 10*3/uL (ref 0.7–4.0)
MCH: 29.3 pg (ref 26.0–34.0)
MCHC: 32.6 g/dL (ref 30.0–36.0)
Neutrophils Relative %: 67 % (ref 43–77)
Platelets: 142 10*3/uL — ABNORMAL LOW (ref 150–400)
RBC: 3 MIL/uL — ABNORMAL LOW (ref 3.87–5.11)
RDW: 15.2 % (ref 11.5–15.5)

## 2013-08-25 MED ORDER — SODIUM CHLORIDE 0.9 % IV SOLN
Freq: Once | INTRAVENOUS | Status: AC
  Administered 2013-08-25: 10:00:00 via INTRAVENOUS

## 2013-08-25 MED ORDER — DEXAMETHASONE SODIUM PHOSPHATE 10 MG/ML IJ SOLN: 10.0000 mg | Freq: Once | INTRAMUSCULAR | Status: DC

## 2013-08-25 MED ORDER — SODIUM CHLORIDE 0.9 % IV SOLN
1000.0000 mg/m2 | Freq: Once | INTRAVENOUS | Status: AC
  Administered 2013-08-25: 1900 mg via INTRAVENOUS
  Filled 2013-08-25: qty 49.97

## 2013-08-25 MED ORDER — SODIUM CHLORIDE 0.9 % IV SOLN: 8.0000 mg | Freq: Once | INTRAVENOUS | Status: DC

## 2013-08-25 MED ORDER — PACLITAXEL PROTEIN-BOUND CHEMO INJECTION 100 MG
125.0000 mg/m2 | Freq: Once | INTRAVENOUS | Status: AC
  Administered 2013-08-25: 225 mg via INTRAVENOUS
  Filled 2013-08-25: qty 45

## 2013-08-25 MED ORDER — HEPARIN SOD (PORK) LOCK FLUSH 100 UNIT/ML IV SOLN
500.0000 [IU] | Freq: Once | INTRAVENOUS | Status: AC | PRN
  Administered 2013-08-25: 500 [IU]
  Filled 2013-08-25: qty 5

## 2013-08-25 MED ORDER — ONDANSETRON HCL 40 MG/20ML IJ SOLN
Freq: Once | INTRAMUSCULAR | Status: AC
  Administered 2013-08-25: 8 mg via INTRAVENOUS
  Filled 2013-08-25: qty 4

## 2013-08-25 NOTE — Progress Notes (Signed)
[facility] OFFICE PROGRESS NOTE  Autumn Obey, MD [address] [place] [address]  DIAGNOSIS: Malignant neoplasm of pancreas, part unspecified  Chief Complaint  Patient presents with  . Pancreatic Cancer    CURRENT THERAPY: Abraxane/Gemzar cycle 1, day 8 today.  INTERVAL HISTORY: [name] 67 y.o. female returns for followup after institution of new chemotherapy for stage IV pancreatic cancer.  She was treated with 5 cycles of FOLFIRINOX with repeat CT scan showing very little effect. For that reason she was switched her current regimen. She fell and hit her head on a chimney at home 3 days ago. She developed a swelling in the right anterior vortex of the scalp. There was no loss of consciousness. She tripped on something on the floor while trying to clean her microwave. She denies any pain today. Jomarie Longs denies any blurred or double vision, loss of consciousness, or poor memory. She also denies any focal weakness. She continues on furosemide daily and has noticed decrease in swelling of both lower remedies. She denied any nausea, vomiting, or peripheral paresthesias. Appetite has been excellent with the ability to shop for 4 hours over the weekend and go out for dinner. She was visiting with her son.  MEDICAL HISTORY: Past Medical History  Diagnosis Date  . Diabetes mellitus without complication   . Hypertension   . Anxiety   . Arthritis   . Pancreatic cancer     INTERIM HISTORY: has Pancreatic mass; Obstructive jaundice; Hypokalemia; Dehydration; Tobacco abuse; HTN (hypertension); Diabetes mellitus; Malignant neoplasm of pancreas, part unspecified; Diabetes type 2, uncontrolled; Fever, unspecified; and Diarrhea on her problem list.    ALLERGIES:  has No Known Allergies.  MEDICATIONS: has a current medication list which includes the following prescription(s): alprazolam, amlodipine, dexamethasone, furosemide, gemcitabine hcl, heparin  lock flush, hydrocodone-acetaminophen, insulin glargine, lorazepam, losartan, omeprazole, paclitaxel protein-bound part, polyethylene glycol powder, and potassium chloride sa, and the following Facility-Administered Medications: Gemcitabine HCl (GEMZAR) 1,900 mg in sodium chloride 0.9 % 100 mL chemo infusion, heparin lock flush, ondansetron (ZOFRAN) 8 mg, dexamethasone (DECADRON) 10 mg in sodium chloride 0.9 % 50 mL IVPB, and paclitaxel-protein bound.  SURGICAL HISTORY:  Past Surgical History  Procedure Laterality Date  . Abdominal hysterectomy    . Colonoscopy  2006    Dr. Jena Gauss, polyp, benign  . Ercp  05/10/2013    Procedure: ENDOSCOPIC RETROGRADE CHOLANGIOPANCREATOGRAPHY (ERCP)(DIFFICULT CANNULATION) ;  Surgeon: Malissa Hippo, MD;  Location: [facility];  Service: Endoscopy;;  . Biliary stent placement  05/10/2013    Procedure: BILIARY STENT PLACEMENT ( X WALLFLEX STENT);  Surgeon: Malissa Hippo, MD;  Location: [facility];  Service: Endoscopy;;  . Sphincterotomy  05/10/2013    Procedure: SPHINCTEROTOMY (PRECUT MADE WITH NEEDLE KNIFE);  Surgeon: Malissa Hippo, MD;  Location: [facility];  Service: Endoscopy;;  . Liver biopsy  05/12/2013  . Portacath placement Left 05/23/2013    Procedure: INSERTION PORT-A-CATH;  Surgeon: Fabio Bering, MD;  Location: [facility];  Service: General;  Laterality: Left;    FAMILY HISTORY: family history includes Bone cancer in her brother; Cancer in her brother; Hypertension in her mother. There is no history of Colon cancer or Liver disease.  SOCIAL HISTORY:  reports that she has quit smoking. She quit smokeless tobacco use about 4 months ago. She reports that she does not drink alcohol or use illicit drugs.  REVIEW OF SYSTEMS:  Other than that discussed above is noncontributory.  PHYSICAL EXAMINATION: ECOG PERFORMANCE STATUS: 1 - Symptomatic but completely ambulatory  There were no vitals taken for this visit.  GENERAL:alert, no distress and  comfortable SKIN: skin color, texture, turgor are normal, no rashes or significant lesions EYES: normal, Conjunctiva are pink and non-injected, sclera clear OROPHARYNX:no exudate, no erythema and lips, buccal mucosa, and tongue normal  NECK: supple, thyroid normal size, non-tender, without nodularity CHEST: Normal AP diameter with light port in place. LYMPH:  no palpable lymphadenopathy in the cervical, axillary or inguinal LUNGS: clear to auscultation and percussion with normal breathing effort HEART: regular rate & rhythm and no murmurs and minimal lower extremity edema ABDOMEN: Distended with a positive fluid wave and shifting dullness. No CVA tenderness. No masses ballotable. Liver and spleen not palpable. :Musculoskeletal:no cyanosis of digits and no clubbing  NEURO: alert & oriented x 3 with fluent speech, no focal motor/sensory deficits   LABORATORY DATA: Appointment on 08/25/2013  Component Date Value Range Status  . WBC 08/25/2013 7.7  4.0 - 10.5 K/uL Final  . RBC 08/25/2013 3.00* 3.87 - 5.11 MIL/uL Final  . Hemoglobin 08/25/2013 8.8* 12.0 - 15.0 g/dL Final  . HCT 91/47/8295 27.0* 36.0 - 46.0 % Final  . MCV 08/25/2013 90.0  78.0 - 100.0 fL Final  . MCH 08/25/2013 29.3  26.0 - 34.0 pg Final  . MCHC 08/25/2013 32.6  30.0 - 36.0 g/dL Final  . RDW 62/13/0865 15.2  11.5 - 15.5 % Final  . Platelets 08/25/2013 142* 150 - 400 K/uL Final  . Neutrophils Relative % 08/25/2013 67  43 - 77 % Final  . Neutro Abs 08/25/2013 5.1  1.7 - 7.7 K/uL Final  . Lymphocytes Relative 08/25/2013 18  12 - 46 % Final  . Lymphs Abs 08/25/2013 1.4  0.7 - 4.0 K/uL Final  . Monocytes Relative 08/25/2013 9  3 - 12 % Final  . Monocytes Absolute 08/25/2013 0.7  0.1 - 1.0 K/uL Final  . Eosinophils Relative 08/25/2013 6* 0 - 5 % Final  . Eosinophils Absolute 08/25/2013 0.5  0.0 - 0.7 K/uL Final  . Basophils Relative 08/25/2013 0  0 - 1 % Final  . Basophils Absolute 08/25/2013 0.0  0.0 - 0.1 K/uL Final  .  Sodium 08/25/2013 134* 135 - 145 mEq/L Final  . Potassium 08/25/2013 3.8  3.5 - 5.1 mEq/L Final  . Chloride 08/25/2013 101  96 - 112 mEq/L Final  . CO2 08/25/2013 25  19 - 32 mEq/L Final  . Glucose, Bld 08/25/2013 190* 70 - 99 mg/dL Final  . BUN 78/46/9629 20  6 - 23 mg/dL Final  . Creatinine, Ser 08/25/2013 0.86  0.50 - 1.10 mg/dL Final  . Calcium 52/84/1324 8.8  8.4 - 10.5 mg/dL Final  . GFR calc non Af Amer 08/25/2013 69* >90 mL/min Final  . GFR calc Af Amer 08/25/2013 80* >90 mL/min Final   Comment: (NOTE)                          The eGFR has been calculated using the CKD EPI equation.                          This calculation has not been validated in all clinical situations.                          eGFR's persistently <90 mL/min signify possible Chronic Kidney  Disease.  . Magnesium 08/25/2013 1.5  1.5 - 2.5 mg/dL Final  Infusion on 16/08/9603  Component Date Value Range Status  . WBC 08/18/2013 10.2  4.0 - 10.5 K/uL Final  . RBC 08/18/2013 3.26* 3.87 - 5.11 MIL/uL Final  . Hemoglobin 08/18/2013 9.4* 12.0 - 15.0 g/dL Final  . HCT 54/07/8118 29.4* 36.0 - 46.0 % Final  . MCV 08/18/2013 90.2  78.0 - 100.0 fL Final  . MCH 08/18/2013 28.8  26.0 - 34.0 pg Final  . MCHC 08/18/2013 32.0  30.0 - 36.0 g/dL Final  . RDW 14/78/2956 16.5* 11.5 - 15.5 % Final  . Platelets 08/18/2013 209  150 - 400 K/uL Final  . Neutrophils Relative % 08/18/2013 62  43 - 77 % Final  . Neutro Abs 08/18/2013 6.3  1.7 - 7.7 K/uL Final  . Lymphocytes Relative 08/18/2013 21  12 - 46 % Final  . Lymphs Abs 08/18/2013 2.1  0.7 - 4.0 K/uL Final  . Monocytes Relative 08/18/2013 15* 3 - 12 % Final  . Monocytes Absolute 08/18/2013 1.5* 0.1 - 1.0 K/uL Final  . Eosinophils Relative 08/18/2013 2  0 - 5 % Final  . Eosinophils Absolute 08/18/2013 0.2  0.0 - 0.7 K/uL Final  . Basophils Relative 08/18/2013 0  0 - 1 % Final  . Basophils Absolute 08/18/2013 0.0  0.0 - 0.1 K/uL Final  . WBC Morphology  08/18/2013 ATYPICAL LYMPHOCYTES   Final  . Sodium 08/18/2013 134* 135 - 145 mEq/L Final  . Potassium 08/18/2013 3.9  3.5 - 5.1 mEq/L Final  . Chloride 08/18/2013 100  96 - 112 mEq/L Final  . CO2 08/18/2013 26  19 - 32 mEq/L Final  . Glucose, Bld 08/18/2013 147* 70 - 99 mg/dL Final  . BUN 21/30/8657 10  6 - 23 mg/dL Final  . Creatinine, Ser 08/18/2013 1.00  0.50 - 1.10 mg/dL Final  . Calcium 84/69/6295 8.9  8.4 - 10.5 mg/dL Final  . Total Protein 08/18/2013 7.2  6.0 - 8.3 g/dL Final  . Albumin 28/41/3244 2.1* 3.5 - 5.2 g/dL Final  . AST 11/29/7251 22  0 - 37 U/L Final  . ALT 08/18/2013 11  0 - 35 U/L Final  . Alkaline Phosphatase 08/18/2013 160* 39 - 117 U/L Final  . Total Bilirubin 08/18/2013 0.4  0.3 - 1.2 mg/dL Final  . GFR calc non Af Amer 08/18/2013 58* >90 mL/min Final  . GFR calc Af Amer 08/18/2013 67* >90 mL/min Final   Comment: (NOTE)                          The eGFR has been calculated using the CKD EPI equation.                          This calculation has not been validated in all clinical situations.                          eGFR's persistently <90 mL/min signify possible Chronic Kidney                          Disease.  Admission on 07/27/2013, Discharged on 08/07/2013  No results displayed because visit has over 200 results.    Infusion on 08/05/2013  Component Date Value Range Status  . Glucose-Capillary 08/05/2013 57* 70 - 99 mg/dL Final  . Glucose-Capillary  08/05/2013 79  70 - 99 mg/dL Final  . Glucose-Capillary 08/05/2013 105* 70 - 99 mg/dL Final  . Comment 1 69/62/9528 Notify RN   Final  . Comment 2 08/05/2013 Documented in Chart   Final  . Glucose-Capillary 08/06/2013 166* 70 - 99 mg/dL Final  . Comment 1 41/32/4401 Notify RN   Final  . Comment 2 08/06/2013 Documented in Chart   Final  . Glucose-Capillary 08/06/2013 180* 70 - 99 mg/dL Final  . Comment 1 02/72/5366 Notify RN   Final  . Comment 2 08/06/2013 Documented in Chart   Final  . Glucose-Capillary  08/06/2013 155* 70 - 99 mg/dL Final  . Comment 1 44/01/4741 Notify RN   Final  . Comment 2 08/06/2013 Documented in Chart   Final  . Glucose-Capillary 08/06/2013 126* 70 - 99 mg/dL Final  . Comment 1 59/56/3875 Notify RN   Final  . Comment 2 08/06/2013 Documented in Chart   Final  . Glucose-Capillary 08/07/2013 125* 70 - 99 mg/dL Final  . Comment 1 64/33/2951 Notify RN   Final  . Comment 2 08/07/2013 Documented in Chart   Final  . Glucose-Capillary 08/07/2013 137* 70 - 99 mg/dL Final  . Comment 1 88/41/6606 Notify RN   Final  . Comment 2 08/07/2013 Documented in Chart   Final     Urinalysis    Component Value Date/Time   COLORURINE YELLOW 07/27/2013 1947   APPEARANCEUR CLEAR 07/27/2013 1947   LABSPEC 1.015 07/27/2013 1947   PHURINE 6.0 07/27/2013 1947   GLUCOSEU 500* 07/27/2013 1947   HGBUR SMALL* 07/27/2013 1947   BILIRUBINUR NEGATIVE 07/27/2013 1947   KETONESUR NEGATIVE 07/27/2013 1947   PROTEINUR TRACE* 07/27/2013 1947   UROBILINOGEN 1.0 07/27/2013 1947   NITRITE NEGATIVE 07/27/2013 1947   LEUKOCYTESUR NEGATIVE 07/27/2013 1947    RADIOGRAPHIC STUDIES: Dg Chest 2 View  07/27/2013   *RADIOLOGY REPORT*  Clinical Data: Weakness and fever.  Recent chemotherapy. Pancreatic cancer.  CHEST - 2 VIEW  Comparison: Pleural chest radiograph 05/23/2013  Findings: Heart size is upper normal.  Mediastinal and hilar contours are stable.  Left subclavian power Port-A-Cath is present. Since the chest radiograph of 05/23/2013, the catheter appears to have been slightly retracted, and now curves posteriorly, as best seen on the lateral view, with the distal catheter in the expected location of the azygos vein.  The lungs are normally expanded and clear.  No focal airspace opacity, mass, or pulmonary edema.  Negative for pleural effusion or pneumothorax.  No acute osseous abnormality.  IMPRESSION: 1. The distal tip of the left subclavian Port-A-Cath has changed in position since prior chest radiograph and is in  the expected location of the azygos vein.  This finding was called to Dr. Lynelle Doctor 7:40 p.m. 07/27/2013. 2.  No acute cardiopulmonary disease identified.   Original Report Authenticated By: Britta Mccreedy, M.D.   Ct Abdomen Pelvis W Contrast  07/28/2013   *RADIOLOGY REPORT*  Clinical Data: Fever and diarrhea.  CT ABDOMEN AND PELVIS WITH CONTRAST  Technique:  Multidetector CT imaging of the abdomen and pelvis was performed following the standard protocol during bolus administration of intravenous contrast.  Contrast: OMNIPAQUE IOHEXOL 300 MG/ML  SOLN, 50mL OMNIPAQUE IOHEXOL 300 MG/ML  SOLN  Comparison: 05/07/2013.  Findings:  BODY WALL: Unremarkable.  LOWER CHEST:  Mediastinum: Mild cardiomegaly.  Lungs/pleura: No consolidation.  There is a spiculated 1 cm nodule in the medial right base which is slightly smaller than before (prior 12mm), although there is motion.  ABDOMEN/PELVIS:  Liver: Diffuse hepatic metastatic disease.  There is likely a response to treatment: the largest mass, subcapsular anterior right lobe, now measures 4.5 cm compared to 6 cm previously.  None of these contains new gas to suggest infection.  Biliary: Interval placement of metallic biliary stent.  Stent appears well positioned.  Left-sided ducts are distended, but filled with gas.  Right-sided defects are less dilated than prior. Overall, no evidence for stent obstruction.  Pancreas: Large pancreatic tail mass is again seen, slightly less bulky, but still measuring 7 x 5 cm in axial span.  As before there is direct invasion into the spleen, left upper pole kidney, and left adrenal gland.  Spleen: Direct invasion of the hilum by pancreatic tail mass. There are new wedge shaped areas of hypo enhancement within the peripheral spleen, consistent with infarcts.  Adrenals: Indistinct plane between the left adrenal gland and pancreatic tail mass.  Kidneys and ureters: Ingrowth into the left kidney, as before.  No acute change such as urinary  obstruction.  2.5 cm cyst in the left upper pole is again seen.  Bladder: Unremarkable.  Bowel: No obstruction.  No pericecal inflammatory change.  Retroperitoneum: No change in retroperitoneal adenopathy, with left periaortic node at the level of the left renal hilum still measuring 2.7 x 1.6 cm.  Peritoneum: Small-volume ascites, mainly around the liver.  Omental nodules are again seen, more bulky - especially in the supravesicular region.  Reproductive: Unremarkable.  Vascular: No acute abnormality.  OSSEOUS: No acute abnormalities. No suspicious lytic or blastic lesions.  IMPRESSION:  1.  No definite intra-abdominal infection. 2. Widely metastatic pancreatic adenocarcinoma. Right lower lung and hepatic metastatic disease shows modest response to therapy. However, peritoneal metastatic disease shows increasing bulk. 3.  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   Original Report Authenticated By: Tiburcio Pea   US Abdomen Limited  08/05/2013   *RADIOLOGY REPORT*  Clinical Data: Ascites.  Pancreatic cancer.  LIMITED ABDOMEN ULTRASOUND FOR ASCITES  Technique: Survey of the abdomen was performed  Comparison:  None.  Findings: Mild ascites in the left upper and lower quadrants.  Mild ascites about the liver.  Moderate ascites in the right lower quadrant.  IMPRESSION: Moderate ascites.   Original Report Authenticated By: Jolaine Click, M.D.   US Abdomen Limited  08/01/2013   *RADIOLOGY REPORT*  Clinical Data: Evaluate for ascites  LIMITED ABDOMEN ULTRASOUND FOR ASCITES  Technique: Scanning over all four quadrants of the abdomen was performed in  Comparison:  None.  Findings: Moderate ascites is present in all four quadrants of the abdomen.  IMPRESSION: Moderate ascites.   Original Report Authenticated By: Jolaine Click, M.D.   US Paracentesis  08/05/2013   *RADIOLOGY REPORT*  ULTRASOUND GUIDED PARACENTESIS:  Clinical Data:  Metastatic pancreatic cancer,  ascites  Technique: After explanation of procedure, benefits, and risks, written informed consent was obtained. Time-out protocol was followed. Collection of ascites in right lower quadrant localized by ultrasound. Skin prepped and draped in usual sterile fashion. Skin and soft tissues anesthestized with 8 ml of 1% lidocaine. 5-French Yueh catheter placed into peritoneal cavity. 2250 ml of clear yellow fluid aspirated by vacuum bottle suction. Procedure tolerated well by patient without immediate complication.  IMPRESSION: Ultrasound-guided paracentesis of 2250 ml of ascitic fluid.   Original Report Authenticated By: Ulyses Southward, M.D.   US Paracentesis  08/01/2013   CLINICAL DATA:  Metastatic pancreatic cancer. Ascites.  EXAM: ULTRASOUND GUIDED  PARACENTESIS  COMPARISON:  None.  FINDINGS: A total of approximately 2.7 L of clear yellow fluid was removed. A fluid sample was sent for laboratory analysis.  IMPRESSION: Successful ultrasound guided paracentesis yielding 2.7 L of ascites.   Electronically Signed   By: Charlett Nose M.D.   On: 08/01/2013 15:24   Dg Cv Line Injection  08/18/2013   CLINICAL DATA:  No blood return through Port-A-Cath pancreatic cancer, chemotherapy  EXAM: CONTRAST INJECTION OF PORT A CATH UNDER FLUOROSCOPY  TECHNIQUE: Contrast was administered via the indwelling port after it was accessed. Fluoroscopic spot images were obtained of the catheter during injection.  CONTRAST:  10 cc Omnipaque 300 IV  FLUOROSCOPY TIME:  0 minutes 30 seconds  FINDINGS: Unable to obtain blood return through Port-A-Cath.  With injection of contrast, contrast opacifies the Port-A-Cath reservoir.  No contrast extravasation.  Intact connected Port-A-Cath tubing extending into SVC ; the tip of the Port-A-Cath appears directed into the lateral wall of the SVC.  Tubing normally opacifies to the tip.  With injection, free spillage of contrast into the SVC is seen though resistance to contrast injection is encountered;  question small amount of fibrin or thrombus at the tip of the Port-A-Cath.  IMPRESSION: Unable to obtain blood return through Port-A-Cath.  Patent opacified Port-A-Cath reservoir and tubing with free spillage of contrast into the SVC though there is resistance to contrast injection question small amount of thrombus or fibrin at the tip of the Port-A-Cath.  Also noted is tip of the Port-A-Cath directed into the lateral wall of the SVC.   Electronically Signed   By: Ulyses Southward M.D.   On: 08/18/2013 11:47    ASSESSMENT: #1. Good tolerance of initial salvage chemotherapy with Gemzar and Abraxane, for cycle 1 day 8 today. #2. Skull contusion, no neurologic deficit. Advised to use Tylenol if needed. #3. Excellent response to diuretic therapy, to continue furosemide and potassium supplements.   PLAN: #1. Abraxane/Gemzar cycle 1 day 8 today. #2. Followup in one week for chemotherapy and office visit.   All questions were answered. The patient knows to call the clinic with any problems, questions or concerns. We can certainly see the patient much sooner if necessary.   I spent 30 minutes counseling the patient face to face. The total time spent in the appointment was 25 minutes.    Maurilio Lovely, MD 08/25/2013 10:44 AM

## 2013-08-25 NOTE — Patient Instructions (Addendum)
[facility] Discharge Instructions for Patients Receiving Chemotherapy  Today you received the following chemotherapy agents Abraxane and Gemzar.  To help prevent nausea and vomiting after your treatment, we encourage you to take your nausea medication as directed.   If you develop nausea and vomiting that is not controlled by your nausea medication, call the clinic. If it is after clinic hours your family physician or the after hours number for the clinic or go to the Emergency Department.   BELOW ARE SYMPTOMS THAT SHOULD BE REPORTED IMMEDIATELY:  *FEVER GREATER THAN 101.0 F  *CHILLS WITH OR WITHOUT FEVER  NAUSEA AND VOMITING THAT IS NOT CONTROLLED WITH YOUR NAUSEA MEDICATION  *UNUSUAL SHORTNESS OF BREATH  *UNUSUAL BRUISING OR BLEEDING  TENDERNESS IN MOUTH AND THROAT WITH OR WITHOUT PRESENCE OF ULCERS  *URINARY PROBLEMS  *BOWEL PROBLEMS  UNUSUAL RASH Items with * indicate a potential emergency and should be followed up as soon as possible.  Return to clinic as scheduled for chemotherapy and MD appointments. Report any issues/concerns as needed. You may take Tylenol for headache.  One of the nurses will contact you 24 hours after your treatment. Please let the nurse know about any problems that you may have experienced. Feel free to call the clinic you have any questions or concerns. The clinic phone number is [phone].   I have been informed and understand all the instructions given to me. I know to contact the clinic, my physician, or go to the Emergency Department if any problems should occur. I do not have any questions at this time, but understand that I may call the clinic during office hours or the Patient Navigator at [phone] should I have any questions or need assistance in obtaining follow up care.    __________________________________________  _____________  __________ Signature of Patient or Authorized Representative            Date                    Time    __________________________________________ Nurse's Signature

## 2013-08-27 ENCOUNTER — Encounter (HOSPITAL_COMMUNITY): Payer: Medicare Other

## 2013-08-28 ENCOUNTER — Other Ambulatory Visit (HOSPITAL_COMMUNITY): Payer: Self-pay | Admitting: Oncology

## 2013-08-28 DIAGNOSIS — E876 Hypokalemia: Secondary | ICD-10-CM

## 2013-08-28 MED ORDER — POTASSIUM CHLORIDE CRYS ER 20 MEQ PO TBCR: 20.0000 meq | EXTENDED_RELEASE_TABLET | Freq: Two times a day (BID) | ORAL | Status: DC

## 2013-09-01 ENCOUNTER — Ambulatory Visit (HOSPITAL_COMMUNITY): Payer: Medicare Other | Admitting: Oncology

## 2013-09-01 ENCOUNTER — Encounter (HOSPITAL_COMMUNITY): Payer: Self-pay | Admitting: Hematology and Oncology

## 2013-09-01 ENCOUNTER — Encounter (HOSPITAL_COMMUNITY): Payer: Medicare Other | Attending: Oncology

## 2013-09-01 ENCOUNTER — Encounter (HOSPITAL_BASED_OUTPATIENT_CLINIC_OR_DEPARTMENT_OTHER): Payer: Medicare Other | Admitting: Oncology

## 2013-09-01 VITALS — BP 156/82 | HR 97 | Temp 98.7°F | Resp 16 | Wt 145.2 lb

## 2013-09-01 DIAGNOSIS — C259 Malignant neoplasm of pancreas, unspecified: Secondary | ICD-10-CM | POA: Insufficient documentation

## 2013-09-01 DIAGNOSIS — D6481 Anemia due to antineoplastic chemotherapy: Secondary | ICD-10-CM | POA: Insufficient documentation

## 2013-09-01 DIAGNOSIS — C787 Secondary malignant neoplasm of liver and intrahepatic bile duct: Secondary | ICD-10-CM

## 2013-09-01 DIAGNOSIS — R188 Other ascites: Secondary | ICD-10-CM

## 2013-09-01 DIAGNOSIS — D696 Thrombocytopenia, unspecified: Secondary | ICD-10-CM

## 2013-09-01 LAB — COMPREHENSIVE METABOLIC PANEL
ALT: 14 U/L (ref 0–35)
AST: 19 U/L (ref 0–37)
Alkaline Phosphatase: 129 U/L — ABNORMAL HIGH (ref 39–117)
CO2: 26 mEq/L (ref 19–32)
GFR calc Af Amer: 72 mL/min — ABNORMAL LOW (ref >90)
GFR calc non Af Amer: 62 mL/min — ABNORMAL LOW (ref >90)
Glucose, Bld: 102 mg/dL — ABNORMAL HIGH (ref 70–99)
Potassium: 3.9 mEq/L (ref 3.5–5.1)
Sodium: 132 mEq/L — ABNORMAL LOW (ref 135–145)
Total Protein: 6.8 g/dL (ref 6.0–8.3)

## 2013-09-01 LAB — CBC WITH DIFFERENTIAL/PLATELET
Basophils Absolute: 0 10*3/uL (ref 0.0–0.1)
Lymphocytes Relative: 19 % (ref 12–46)
Lymphs Abs: 1 10*3/uL (ref 0.7–4.0)
Neutrophils Relative %: 70 % (ref 43–77)
Platelets: 55 10*3/uL — ABNORMAL LOW (ref 150–400)
RBC: 2.88 MIL/uL — ABNORMAL LOW (ref 3.87–5.11)
RDW: 15.5 % (ref 11.5–15.5)
WBC: 4.9 10*3/uL (ref 4.0–10.5)

## 2013-09-01 NOTE — Progress Notes (Signed)
plts 55,000.  Chemo held today.  Will retry  In two weeks.

## 2013-09-01 NOTE — Progress Notes (Signed)
Autumn Obey, MD [address] [place] [address]  Malignant neoplasm of pancreas, part unspecified  CURRENT THERAPY: Salvage Gemcitabine/Abraxane D1, 8, 15 every 28 days.  To embark on D 15 of cycle 1 today.  This regimen began on 08/18/2013 after suspected failure of FOLFIRINOX from 05/26/2013- 07/21/2013.   INTERVAL HISTORY: [name] 67 y.o. female returns for  regular  visit for followup of Metastatic pancreatic cancer S/P 5 cycles of FOLFIRINOX chemotherapy with stable disease per CT scans, but recurrent ascites worrisome for progression of disease.  She was treated with FOLFIRINOX + Neulasta support from 05/26/2013- 07/21/2013 with a change in therapy to Gemcitabine/Abraxane D1, 8, 15 every 28 days beginning on 08/18/2013.  Due to thrombocytopenia today (09/01/2013) with a platelet count at 55,000, I will cancel today's Day 15 treatment (cycle 1) and delete Day 15 of treatment from subsequent cycles of chemotherapy as well.   [name] is tolerating chemotherapy well.  She admits that this regimen is a little easier than her previous regimen, even though she tolerated FOLFIRINOX extremely well.  She denies any nausea or vomiting.  She denies any pain.  She denies any abdominal bloating or pain.   I personally reviewed and went over laboratory results with the patient.  Her WBC is WNL at 4.9, Hgb is low at 8.3 g/dL, and platelets are low at 55,000 which prevent her from getting day 15 of treatment today.  Therefore, I will cancel today's chemotherapy and she will return in 2 weeks to start cycle 2 of chemotherapy as scheduled. Additionally, I will delete Day 15 of each cycle due to her thrombocytopenia and typically with this regimen, Day 15 is a difficult treatment date to meet treatment parameters.   Otherwise, she denies any complaints and ROS questioning is negative.   Past Medical History  Diagnosis Date  . Diabetes mellitus without complication   .  Hypertension   . Anxiety   . Arthritis   . Pancreatic cancer     has Pancreatic mass; Obstructive jaundice; Hypokalemia; Dehydration; Tobacco abuse; HTN (hypertension); Diabetes mellitus; Malignant neoplasm of pancreas, part unspecified; Diabetes type 2, uncontrolled; Fever, unspecified; and Diarrhea on her problem list.     has No Known Allergies.  Ms. Mccay does not currently have medications on file.  Past Surgical History  Procedure Laterality Date  . Abdominal hysterectomy    . Colonoscopy  2006    Dr. Jena Gauss, polyp, benign  . Ercp  05/10/2013    Procedure: ENDOSCOPIC RETROGRADE CHOLANGIOPANCREATOGRAPHY (ERCP)(DIFFICULT CANNULATION) ;  Surgeon: Malissa Hippo, MD;  Location: [facility];  Service: Endoscopy;;  . Biliary stent placement  05/10/2013    Procedure: BILIARY STENT PLACEMENT ( X WALLFLEX STENT);  Surgeon: Malissa Hippo, MD;  Location: [facility];  Service: Endoscopy;;  . Sphincterotomy  05/10/2013    Procedure: SPHINCTEROTOMY (PRECUT MADE WITH NEEDLE KNIFE);  Surgeon: Malissa Hippo, MD;  Location: [facility];  Service: Endoscopy;;  . Liver biopsy  05/12/2013  . Portacath placement Left 05/23/2013    Procedure: INSERTION PORT-A-CATH;  Surgeon: Fabio Bering, MD;  Location: [facility];  Service: General;  Laterality: Left;    Denies any headaches, dizziness, double vision, fevers, chills, night sweats, nausea, vomiting, diarrhea, constipation, chest pain, heart palpitations, shortness of breath, blood in stool, black tarry stool, urinary pain, urinary burning, urinary frequency, hematuria.   PHYSICAL EXAMINATION  ECOG PERFORMANCE STATUS: 0 - Asymptomatic  Filed Vitals:  09/01/13 0900  BP: 156/82  Pulse: 97  Temp: 98.7 F (37.1 C)  Resp: 16    GENERAL:alert, no distress, well nourished, well developed, comfortable, cooperative and smiling SKIN: skin color, texture, turgor are normal, no rashes or significant lesions HEAD: Normocephalic, No masses, lesions,  tenderness or abnormalities EYES: normal, PERRLA, EOMI, Conjunctiva are pink and non-injected EARS: External ears normal OROPHARYNX:lips, buccal mucosa, and tongue normal and mucous membranes are moist  NECK: supple, no adenopathy, thyroid normal size, non-tender, without nodularity, no stridor, non-tender, trachea midline LYMPH:  no palpable lymphadenopathy, no hepatosplenomegaly BREAST:not examined LUNGS: clear to auscultation and percussion HEART: regular rate & rhythm, no murmurs, no gallops, S1 normal and S2 normal ABDOMEN:abdomen soft, non-tender, normal bowel sounds, no masses or organomegaly and no hepatosplenomegaly BACK: Back symmetric, no curvature., No CVA tenderness EXTREMITIES:less then 2 second capillary refill, no joint deformities, effusion, or inflammation, no edema, no skin discoloration, no clubbing, no cyanosis  NEURO: alert & oriented x 3 with fluent speech, no focal motor/sensory deficits, gait normal    LABORATORY DATA: CBC    Component Value Date/Time   WBC 4.9 09/01/2013 0842   RBC 2.88* 09/01/2013 0842   RBC 2.71* 07/28/2013 0557   HGB 8.3* 09/01/2013 0842   HCT 25.5* 09/01/2013 0842   PLT 55* 09/01/2013 0842   MCV 88.5 09/01/2013 0842   MCH 28.8 09/01/2013 0842   MCHC 32.5 09/01/2013 0842   RDW 15.5 09/01/2013 0842   LYMPHSABS 1.0 09/01/2013 0842   MONOABS 0.3 09/01/2013 0842   EOSABS 0.2 09/01/2013 0842   BASOSABS 0.0 09/01/2013 0842      Chemistry      Component Value Date/Time   NA 132* 09/01/2013 0842   K 3.9 09/01/2013 0842   CL 98 09/01/2013 0842   CO2 26 09/01/2013 0842   BUN 17 09/01/2013 0842   CREATININE 0.94 09/01/2013 0842      Component Value Date/Time   CALCIUM 8.7 09/01/2013 0842   ALKPHOS 129* 09/01/2013 0842   AST 19 09/01/2013 0842   ALT 14 09/01/2013 0842   BILITOT 0.5 09/01/2013 0842        ASSESSMENT:  1.  Metastatic pancreatic cancer S/P 5 cycles of FOLFIRINOX chemotherapy with stable disease per CT scans, but recurrent ascites  worrisome for progression of disease.  She was treated with FOLFIRINOX + Neulasta support from 05/26/2013- 07/21/2013 with a change in therapy to Salvage Gemcitabine/Abraxane D1, 8 every 28 days beginning on 08/18/2013.   Patient Active Problem List   Diagnosis Date Noted  . Fever, unspecified 07/27/2013  . Diarrhea 07/27/2013  . Diabetes type 2, uncontrolled 05/22/2013  . Malignant neoplasm of pancreas, part unspecified 05/21/2013  . Pancreatic mass 05/07/2013  . Obstructive jaundice 05/07/2013  . Hypokalemia 05/07/2013  . Dehydration 05/07/2013  . Tobacco abuse 05/07/2013  . HTN (hypertension) 05/07/2013  . Diabetes mellitus 05/07/2013     PLAN:  1. I personally reviewed and went over laboratory results with the patient. 2. Move on with chemotherapy today as scheduled.  Today is D15 of cycle 1 of Salvage Gemcitabine/Abraxane.  3. Due to thrombocytopenia, will cancel today's treatment 4. Will delete Day 15 of each cycle 5. Cycle 2 of chemotherapy as scheduled.  6. Day 15 cancellation adjusted in treatment plan 7. Pre-chemo labs: CBC diff, CMET, CA 19-9 (every 28 days) 8. Return for cycle 2 of chemotherapy as scheduled.  9. Return in 2-3 weeks for follow-up   THERAPY PLAN:  She is tolerating  chemotherapy well.  She will continue with Salvage Gemcitabine/Abraxane for 3 cycles and then we will perform restaging CT scans for effectiveness.  Due to thrombocytopenia today with a platelet count at 55,000, will cancel today's Day 15 treatment (cycle 1) and delete Day 15 of treatment from subsequent cycles of chemotherapy.  All questions were answered. The patient knows to call the clinic with any problems, questions or concerns. We can certainly see the patient much sooner if necessary.  Patient and plan discussed with Dr. Alla German and he is in agreement with the aforementioned. More than 50% of the time spent with the patient was utilized for counseling and coordination of  care.  Chisum Habenicht

## 2013-09-04 ENCOUNTER — Encounter (HOSPITAL_BASED_OUTPATIENT_CLINIC_OR_DEPARTMENT_OTHER): Payer: Medicare Other

## 2013-09-04 DIAGNOSIS — C259 Malignant neoplasm of pancreas, unspecified: Secondary | ICD-10-CM

## 2013-09-04 LAB — CBC WITH DIFFERENTIAL/PLATELET
Basophils Relative: 1 % (ref 0–1)
Eosinophils Absolute: 0.2 10*3/uL (ref 0.0–0.7)
Eosinophils Relative: 2 % (ref 0–5)
Hemoglobin: 8.7 g/dL — ABNORMAL LOW (ref 12.0–15.0)
Lymphs Abs: 1.5 10*3/uL (ref 0.7–4.0)
MCH: 28.7 pg (ref 26.0–34.0)
MCHC: 33 g/dL (ref 30.0–36.0)
MCV: 87.1 fL (ref 78.0–100.0)
Monocytes Absolute: 0.8 10*3/uL (ref 0.1–1.0)
Monocytes Relative: 11 % (ref 3–12)
Neutrophils Relative %: 66 % (ref 43–77)

## 2013-09-04 NOTE — Progress Notes (Signed)
Labs drawn today for cbc/diff 

## 2013-09-08 ENCOUNTER — Inpatient Hospital Stay (HOSPITAL_COMMUNITY): Payer: Medicare Other

## 2013-09-11 ENCOUNTER — Encounter (HOSPITAL_BASED_OUTPATIENT_CLINIC_OR_DEPARTMENT_OTHER): Payer: Medicare Other

## 2013-09-11 DIAGNOSIS — C259 Malignant neoplasm of pancreas, unspecified: Secondary | ICD-10-CM

## 2013-09-11 LAB — CBC WITH DIFFERENTIAL/PLATELET
Basophils Absolute: 0.1 10*3/uL (ref 0.0–0.1)
Basophils Relative: 1 % (ref 0–1)
Eosinophils Absolute: 0.5 10*3/uL (ref 0.0–0.7)
Eosinophils Relative: 4 % (ref 0–5)
MCH: 28.5 pg (ref 26.0–34.0)
MCHC: 32.6 g/dL (ref 30.0–36.0)
MCV: 87.5 fL (ref 78.0–100.0)
Neutrophils Relative %: 53 % (ref 43–77)
Platelets: 577 10*3/uL — ABNORMAL HIGH (ref 150–400)
RBC: 3.12 MIL/uL — ABNORMAL LOW (ref 3.87–5.11)
RDW: 17.6 % — ABNORMAL HIGH (ref 11.5–15.5)

## 2013-09-11 NOTE — Progress Notes (Signed)
Labs drawn today for cbc/diff 

## 2013-09-14 NOTE — Progress Notes (Signed)
[name], MD [address] [place] [address]  Malignant neoplasm of pancreas, part unspecified - Plan: CT Chest W Contrast, CT Abdomen Pelvis W Contrast  Antineoplastic chemotherapy induced anemia(285.3)  CURRENT THERAPY: S/P cycle 1 of Salvage Gemcitabine/Abraxane beginning on 08/18/2013.  Day 15 of chemotherapy has been deleted from all cycles due to thrombocytopenia for Day 15 cycle 1 that required cancellation of treatment day.  INTERVAL HISTORY: [name] 67 y.o. female returns for  regular  visit for followup of Metastatic pancreatic cancer S/P 5 cycles of FOLFIRINOX chemotherapy with stable disease per CT scans, but recurrent ascites worrisome for progression of disease. She was treated with FOLFIRINOX + Neulasta support from 05/26/2013- 07/21/2013 with a change in therapy to Gemcitabine/Abraxane D1, 8, 15 every 28 days beginning on 08/18/2013. Due to thrombocytopenia for Day 15 of cycle 1, Day 15 of therapy is deleted from all subsequent cycles     I personally reviewed and went over laboratory results with the patient.  Labs from 10/16 shows a WBC of 12.5 with a Hgb that is improving to 8.9 g/dL, and platelet count of 577,000.  We will check pre-chemo labs today in preparation for cycle 2 of salvage chemotherapy.  Most recent CA 19-9 is down to 13,181.7 compared to >14,000 since time of diagnosis.  She is doing well and tolerating therapy without problems.  She denies any abdominal pain or abdominal distension since the switch in therapy.    We reviewed her Hemoglobin today and I discussed transfusional therapy versus ESA therapy.  We discussed the risks, benefits, alternatives, and side effects of each option.  After reviewing these, the patient has decided to embark on Aranesp supportive therapy.  This will allow [place] to coordinate with chemotherapy plan.  We discussed the risk of continuing ESA therapy beyond Hgb treatment parameters and the  detrimental effect it could have on her cancer response. We will start that therapy today and I have built a 500 mcg Aranesp every 4 weeks supportive therapy plan.  Following cycle 3 of chemotherapy, we will restage her with imaging studies.    Oncologically, she denies any complaints and ROS questioning is negative.  Past Medical History  Diagnosis Date  . Diabetes mellitus without complication   . Hypertension   . Anxiety   . Arthritis   . Pancreatic cancer   . Antineoplastic chemotherapy induced anemia(285.3) 09/15/2013    has Obstructive jaundice; Tobacco abuse; HTN (hypertension); Diabetes mellitus; Malignant neoplasm of pancreas, part unspecified; and Antineoplastic chemotherapy induced anemia(285.3) on her problem list.     has No Known Allergies.  Ms. Freehling does not currently have medications on file.  Past Surgical History  Procedure Laterality Date  . Abdominal hysterectomy    . Colonoscopy  2006    Dr. Jena Gauss, polyp, benign  . Ercp  05/10/2013    Procedure: ENDOSCOPIC RETROGRADE CHOLANGIOPANCREATOGRAPHY (ERCP)(DIFFICULT CANNULATION) ;  Surgeon: Malissa Hippo, MD;  Location: [facility];  Service: Endoscopy;;  . Biliary stent placement  05/10/2013    Procedure: BILIARY STENT PLACEMENT ( X WALLFLEX STENT);  Surgeon: Malissa Hippo, MD;  Location: [facility];  Service: Endoscopy;;  . Sphincterotomy  05/10/2013    Procedure: SPHINCTEROTOMY (PRECUT MADE WITH NEEDLE KNIFE);  Surgeon: Malissa Hippo, MD;  Location: [facility];  Service: Endoscopy;;  . Liver biopsy  05/12/2013  . Portacath placement Left 05/23/2013    Procedure: INSERTION PORT-A-CATH;  Surgeon: Fabio Bering,  MD;  Location: [facility];  Service: General;  Laterality: Left;    Denies any headaches, dizziness, double vision, fevers, chills, night sweats, nausea, vomiting, diarrhea, constipation, chest pain, heart palpitations, shortness of breath, blood in stool, black tarry stool, urinary pain, urinary burning,  urinary frequency, hematuria.   PHYSICAL EXAMINATION  ECOG PERFORMANCE STATUS: 1 - Symptomatic but completely ambulatory  There were no vitals filed for this visit.  GENERAL:alert, no distress, well nourished, well developed, comfortable, cooperative and smiling SKIN: skin color, texture, turgor are normal, no rashes or significant lesions HEAD: Normocephalic, No masses, lesions, tenderness or abnormalities EYES: normal, PERRLA, EOMI, Conjunctiva are pink and non-injected EARS: External ears normal OROPHARYNX:mucous membranes are moist  NECK: supple, no adenopathy, trachea midline LYMPH:  no palpable lymphadenopathy BREAST:not examined LUNGS: clear to auscultation and percussion HEART: regular rate & rhythm, no murmurs, no gallops, S1 normal and S2 normal ABDOMEN:abdomen soft, non-tender, normal bowel sounds, no masses or organomegaly and no hepatosplenomegaly BACK: Back symmetric, no curvature. EXTREMITIES:less then 2 second capillary refill, no joint deformities, effusion, or inflammation, no edema, no skin discoloration, no clubbing, no cyanosis  NEURO: alert & oriented x 3 with fluent speech, no focal motor/sensory deficits, gait normal    LABORATORY DATA: CBC    Component Value Date/Time   WBC 11.8* 09/15/2013 0928   RBC 3.06* 09/15/2013 0928   RBC 2.71* 07/28/2013 0557   HGB 8.6* 09/15/2013 0928   HCT 27.2* 09/15/2013 0928   PLT 449* 09/15/2013 0928   MCV 88.9 09/15/2013 0928   MCH 28.1 09/15/2013 0928   MCHC 31.6 09/15/2013 0928   RDW 18.4* 09/15/2013 0928   LYMPHSABS 3.5 09/15/2013 0928   MONOABS 1.6* 09/15/2013 0928   EOSABS 0.3 09/15/2013 0928   BASOSABS 0.1 09/15/2013 0928   Results for ENDIA, MONCUR (MRN [id number]) as of 09/14/2013 10:39  Ref. Range 09/01/2013 08:42  CA 19-9 Latest Range: <35.0 U/mL 130181.7 (H)    PENDING LABS: CBC diff, CMET, CA 19-9    ASSESSMENT:  1. Metastatic pancreatic cancer S/P 5 cycles of FOLFIRINOX chemotherapy with stable  disease per CT scans, but recurrent ascites worrisome for progression of disease. She was treated with FOLFIRINOX + Neulasta support from 05/26/2013- 07/21/2013 with a change in therapy to Gemcitabine/Abraxane D1, 8, 15 every 28 days beginning on 08/18/2013. Due to thrombocytopenia for Day 15 of cycle 1, Day 15 of therapy is deleted from all subsequent cycles    Patient Active Problem List   Diagnosis Date Noted  . 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 Malignant neoplasm of pancreas, part unspecified 05/21/2013  . Obstructive jaundice 05/07/2013  . Tobacco abuse 05/07/2013  . HTN (hypertension) 05/07/2013  . Diabetes mellitus 05/07/2013     PLAN:  1. I personally reviewed and went over laboratory results with the patient. 2. Pre-chemo labs today in preparation for cycle 2. 3. If treatment parameters are met, will move forward with cycle 2 as scheduled.  4. Plan for restaging CT CAP with contrast following cycle 3 of chemotherapy.  Orders placed.  5. Pre-chemo labs: CBC diff, CMET, CA 19-9 Day 1 of each cycle. 6. Pre-chemo labs: CBC diff day 8 of each cycle. 7. Influenza vaccine not indicated for [facility] while on active therapy. 8. Supportive therapy plan built: Aranesp 500 mcg every 4 weeks.  9. Return in 4 weeks for follow-up, sooner if needed.   THERAPY PLAN:  Pending treatment parameters are met, will move forward with cycle 2 of salvage  gemcitabine/abraxane.  If she does well clinically, then we plan on restaging her with CT scans following cycle 3 of chemotherapy.    All questions were answered. The patient knows to call the clinic with any problems, questions or concerns. We can certainly see the patient much sooner if necessary.  Patient and plan discussed with Dr. Alla German and he is in agreement with the aforementioned.   More than 50% of the time spent with the patient was utilized for counseling and coordination of  care.  Sharlize Hoar  Addendum: Treatment parameters met for treatment today and she will move forward as scheduled.   Charyl Minervini

## 2013-09-15 ENCOUNTER — Encounter (HOSPITAL_COMMUNITY): Payer: Self-pay | Admitting: Oncology

## 2013-09-15 ENCOUNTER — Encounter (HOSPITAL_BASED_OUTPATIENT_CLINIC_OR_DEPARTMENT_OTHER): Payer: Medicare Other

## 2013-09-15 ENCOUNTER — Encounter (HOSPITAL_BASED_OUTPATIENT_CLINIC_OR_DEPARTMENT_OTHER): Payer: Medicare Other | Admitting: Oncology

## 2013-09-15 ENCOUNTER — Encounter (HOSPITAL_COMMUNITY): Payer: Self-pay

## 2013-09-15 VITALS — BP 131/86 | HR 79 | Temp 98.2°F | Resp 18 | Wt 146.2 lb

## 2013-09-15 DIAGNOSIS — C787 Secondary malignant neoplasm of liver and intrahepatic bile duct: Secondary | ICD-10-CM

## 2013-09-15 DIAGNOSIS — J90 Pleural effusion, not elsewhere classified: Secondary | ICD-10-CM

## 2013-09-15 DIAGNOSIS — Z5111 Encounter for antineoplastic chemotherapy: Secondary | ICD-10-CM

## 2013-09-15 DIAGNOSIS — D6481 Anemia due to antineoplastic chemotherapy: Secondary | ICD-10-CM

## 2013-09-15 DIAGNOSIS — T451X5A Adverse effect of antineoplastic and immunosuppressive drugs, initial encounter: Secondary | ICD-10-CM | POA: Insufficient documentation

## 2013-09-15 DIAGNOSIS — C259 Malignant neoplasm of pancreas, unspecified: Secondary | ICD-10-CM

## 2013-09-15 HISTORY — DX: Anemia due to antineoplastic chemotherapy: D64.81

## 2013-09-15 LAB — CBC WITH DIFFERENTIAL/PLATELET
Basophils Absolute: 0.1 10*3/uL (ref 0.0–0.1)
HCT: 27.2 % — ABNORMAL LOW (ref 36.0–46.0)
Hemoglobin: 8.6 g/dL — ABNORMAL LOW (ref 12.0–15.0)
Lymphocytes Relative: 30 % (ref 12–46)
MCH: 28.1 pg (ref 26.0–34.0)
Monocytes Absolute: 1.6 10*3/uL — ABNORMAL HIGH (ref 0.1–1.0)
Monocytes Relative: 14 % — ABNORMAL HIGH (ref 3–12)
Neutro Abs: 6.4 10*3/uL (ref 1.7–7.7)
Neutrophils Relative %: 54 % (ref 43–77)
RDW: 18.4 % — ABNORMAL HIGH (ref 11.5–15.5)
WBC: 11.8 10*3/uL — ABNORMAL HIGH (ref 4.0–10.5)

## 2013-09-15 LAB — COMPREHENSIVE METABOLIC PANEL
Albumin: 2.1 g/dL — ABNORMAL LOW (ref 3.5–5.2)
BUN: 21 mg/dL (ref 6–23)
CO2: 23 mEq/L (ref 19–32)
Calcium: 8.7 mg/dL (ref 8.4–10.5)
GFR calc Af Amer: 73 mL/min — ABNORMAL LOW (ref >90)
GFR calc non Af Amer: 63 mL/min — ABNORMAL LOW (ref >90)
Glucose, Bld: 91 mg/dL (ref 70–99)
Sodium: 137 mEq/L (ref 135–145)
Total Protein: 7.6 g/dL (ref 6.0–8.3)

## 2013-09-15 MED ORDER — SODIUM CHLORIDE 0.9 % IV SOLN
Freq: Once | INTRAVENOUS | Status: AC
  Administered 2013-09-15: 8 mg via INTRAVENOUS
  Filled 2013-09-15: qty 4

## 2013-09-15 MED ORDER — SODIUM CHLORIDE 0.9 % IV SOLN
1000.0000 mg/m2 | Freq: Once | INTRAVENOUS | Status: AC
  Administered 2013-09-15: 1900 mg via INTRAVENOUS
  Filled 2013-09-15: qty 49.97

## 2013-09-15 MED ORDER — SODIUM CHLORIDE 0.9 % IJ SOLN
10.0000 mL | INTRAMUSCULAR | Status: DC | PRN
  Administered 2013-09-15: 10 mL

## 2013-09-15 MED ORDER — SODIUM CHLORIDE 0.9 % IV SOLN: 8.0000 mg | Freq: Once | INTRAVENOUS | Status: DC

## 2013-09-15 MED ORDER — DARBEPOETIN ALFA-POLYSORBATE 500 MCG/ML IJ SOLN
500.0000 ug | Freq: Once | INTRAMUSCULAR | Status: AC
  Administered 2013-09-15: 500 ug via SUBCUTANEOUS

## 2013-09-15 MED ORDER — HEPARIN SOD (PORK) LOCK FLUSH 100 UNIT/ML IV SOLN
500.0000 [IU] | Freq: Once | INTRAVENOUS | Status: AC | PRN
  Administered 2013-09-15: 500 [IU]
  Filled 2013-09-15: qty 5

## 2013-09-15 MED ORDER — DEXAMETHASONE SODIUM PHOSPHATE 10 MG/ML IJ SOLN: 10.0000 mg | Freq: Once | INTRAMUSCULAR | Status: DC

## 2013-09-15 MED ORDER — PACLITAXEL PROTEIN-BOUND CHEMO INJECTION 100 MG
125.0000 mg/m2 | Freq: Once | INTRAVENOUS | Status: AC
  Administered 2013-09-15: 225 mg via INTRAVENOUS
  Filled 2013-09-15: qty 45

## 2013-09-15 MED ORDER — SODIUM CHLORIDE 0.9 % IV SOLN
Freq: Once | INTRAVENOUS | Status: AC
  Administered 2013-09-15: 10:00:00 via INTRAVENOUS

## 2013-09-15 NOTE — Patient Instructions (Signed)
[facility] Discharge Instructions  RECOMMENDATIONS MADE BY THE CONSULTANT AND ANY TEST RESULTS WILL BE SENT TO YOUR REFERRING PHYSICIAN.  EXAM FINDINGS BY THE PHYSICIAN TODAY AND SIGNS OR SYMPTOMS TO REPORT TO CLINIC OR PRIMARY PHYSICIAN: Exam and findings as discussed by T. Kefalas, PA-C.  INSTRUCTIONS/FOLLOW-UP: 1.  We are starting your on Aranesp, a medication given every 4 weeks to boost your red blood cell production.  Risks and benefits were discussed with T. Kefalas, PA-C. 2.  Return in 4 weeks as scheduled to see T. Kefalas, PA-C and in 8 weeks to see the physician.  Please contact us sooner if you have questions or concerns.  Thank you for choosing [facility] to provide your oncology and hematology care.  To afford each patient quality time with our providers, please arrive at least 15 minutes before your scheduled appointment time.  With your help, our goal is to use those 15 minutes to complete the necessary work-up to ensure our physicians have the information they need to help with your evaluation and healthcare recommendations.    Effective January 1st, 2014, we ask that you re-schedule your appointment with our physicians should you arrive 10 or more minutes late for your appointment.  We strive to give you quality time with our providers, and arriving late affects you and other patients whose appointments are after yours.    Again, thank you for choosing [facility].  Our hope is that these requests will decrease the amount of time that you wait before being seen by our physicians.       _____________________________________________________________  Should you have questions after your visit to [facility], please contact our office at [phone] between the hours of 8:30 a.m. and 5:00 p.m.  Voicemails left after 4:30 p.m. will not be returned until the following business day.  For prescription refill requests, have  your pharmacy contact our office with your prescription refill request.

## 2013-09-22 ENCOUNTER — Encounter (HOSPITAL_BASED_OUTPATIENT_CLINIC_OR_DEPARTMENT_OTHER): Payer: Medicare Other

## 2013-09-22 VITALS — BP 150/87 | HR 91 | Temp 98.2°F | Resp 18 | Wt 148.6 lb

## 2013-09-22 DIAGNOSIS — Z5111 Encounter for antineoplastic chemotherapy: Secondary | ICD-10-CM

## 2013-09-22 DIAGNOSIS — D6481 Anemia due to antineoplastic chemotherapy: Secondary | ICD-10-CM

## 2013-09-22 DIAGNOSIS — C259 Malignant neoplasm of pancreas, unspecified: Secondary | ICD-10-CM

## 2013-09-22 LAB — CBC WITH DIFFERENTIAL/PLATELET
Basophils Relative: 0 % (ref 0–1)
Eosinophils Absolute: 0.2 10*3/uL (ref 0.0–0.7)
Eosinophils Relative: 1 % (ref 0–5)
HCT: 25.3 % — ABNORMAL LOW (ref 36.0–46.0)
Hemoglobin: 8.1 g/dL — ABNORMAL LOW (ref 12.0–15.0)
MCH: 28.6 pg (ref 26.0–34.0)
MCHC: 32 g/dL (ref 30.0–36.0)
MCV: 89.4 fL (ref 78.0–100.0)
Monocytes Absolute: 1.1 10*3/uL — ABNORMAL HIGH (ref 0.1–1.0)
Monocytes Relative: 9 % (ref 3–12)
Neutro Abs: 8.5 10*3/uL — ABNORMAL HIGH (ref 1.7–7.7)

## 2013-09-22 LAB — COMPREHENSIVE METABOLIC PANEL
ALT: 22 U/L (ref 0–35)
AST: 22 U/L (ref 0–37)
Albumin: 2 g/dL — ABNORMAL LOW (ref 3.5–5.2)
Alkaline Phosphatase: 113 U/L (ref 39–117)
BUN: 20 mg/dL (ref 6–23)
Calcium: 8.3 mg/dL — ABNORMAL LOW (ref 8.4–10.5)
Potassium: 3.8 mEq/L (ref 3.5–5.1)
Sodium: 135 mEq/L (ref 135–145)
Total Protein: 6.9 g/dL (ref 6.0–8.3)

## 2013-09-22 MED ORDER — HEPARIN SOD (PORK) LOCK FLUSH 100 UNIT/ML IV SOLN
500.0000 [IU] | Freq: Once | INTRAVENOUS | Status: AC | PRN
  Administered 2013-09-22: 500 [IU]
  Filled 2013-09-22: qty 5

## 2013-09-22 MED ORDER — DARBEPOETIN ALFA-POLYSORBATE 500 MCG/ML IJ SOLN
INTRAMUSCULAR | Status: AC
  Filled 2013-09-22: qty 1

## 2013-09-22 MED ORDER — DARBEPOETIN ALFA-POLYSORBATE 500 MCG/ML IJ SOLN
500.0000 ug | Freq: Once | INTRAMUSCULAR | Status: AC
  Administered 2013-09-22: 500 ug via SUBCUTANEOUS

## 2013-09-22 MED ORDER — SODIUM CHLORIDE 0.9 % IV SOLN
Freq: Once | INTRAVENOUS | Status: AC
  Administered 2013-09-22: 8 mg via INTRAVENOUS
  Filled 2013-09-22: qty 4

## 2013-09-22 MED ORDER — PACLITAXEL PROTEIN-BOUND CHEMO INJECTION 100 MG
125.0000 mg/m2 | Freq: Once | INTRAVENOUS | Status: AC
  Administered 2013-09-22: 225 mg via INTRAVENOUS
  Filled 2013-09-22: qty 45

## 2013-09-22 MED ORDER — SODIUM CHLORIDE 0.9 % IV SOLN
Freq: Once | INTRAVENOUS | Status: AC
  Administered 2013-09-22: 11:00:00 via INTRAVENOUS

## 2013-09-22 MED ORDER — DEXAMETHASONE SODIUM PHOSPHATE 10 MG/ML IJ SOLN: 10.0000 mg | Freq: Once | INTRAMUSCULAR | Status: DC

## 2013-09-22 MED ORDER — SODIUM CHLORIDE 0.9 % IV SOLN
1000.0000 mg/m2 | Freq: Once | INTRAVENOUS | Status: AC
  Administered 2013-09-22: 1900 mg via INTRAVENOUS
  Filled 2013-09-22: qty 49.97

## 2013-09-22 MED ORDER — SODIUM CHLORIDE 0.9 % IV SOLN: 8.0000 mg | Freq: Once | INTRAVENOUS | Status: DC

## 2013-09-22 NOTE — Progress Notes (Signed)
Hgb down 8.1 from 8.6 last week after beginning aranesp injections. Pt denies SOB, edema, reports she has been able to do what she has wanted to. Per Dr.Formanek give aranesp 500 mcg again today.

## 2013-09-25 ENCOUNTER — Other Ambulatory Visit (HOSPITAL_COMMUNITY): Payer: Self-pay | Admitting: Oncology

## 2013-09-25 DIAGNOSIS — E876 Hypokalemia: Secondary | ICD-10-CM

## 2013-09-25 DIAGNOSIS — C259 Malignant neoplasm of pancreas, unspecified: Secondary | ICD-10-CM

## 2013-09-25 MED ORDER — DEXAMETHASONE 4 MG PO TABS: ORAL_TABLET | ORAL | Status: DC

## 2013-09-25 MED ORDER — POTASSIUM CHLORIDE CRYS ER 20 MEQ PO TBCR: 20.0000 meq | EXTENDED_RELEASE_TABLET | Freq: Every day | ORAL | Status: DC

## 2013-09-29 ENCOUNTER — Encounter (HOSPITAL_BASED_OUTPATIENT_CLINIC_OR_DEPARTMENT_OTHER): Payer: Medicare Other

## 2013-09-29 ENCOUNTER — Inpatient Hospital Stay (HOSPITAL_COMMUNITY): Payer: Medicare Other

## 2013-09-29 ENCOUNTER — Encounter (HOSPITAL_COMMUNITY): Payer: Medicare Other | Attending: Oncology

## 2013-09-29 VITALS — BP 149/85 | HR 87

## 2013-09-29 DIAGNOSIS — D6481 Anemia due to antineoplastic chemotherapy: Secondary | ICD-10-CM | POA: Insufficient documentation

## 2013-09-29 DIAGNOSIS — C259 Malignant neoplasm of pancreas, unspecified: Secondary | ICD-10-CM | POA: Insufficient documentation

## 2013-09-29 DIAGNOSIS — G609 Hereditary and idiopathic neuropathy, unspecified: Secondary | ICD-10-CM | POA: Insufficient documentation

## 2013-09-29 LAB — CBC
HCT: 25.8 % — ABNORMAL LOW (ref 36.0–46.0)
MCV: 90.5 fL (ref 78.0–100.0)
Platelets: 73 10*3/uL — ABNORMAL LOW (ref 150–400)
RBC: 2.85 MIL/uL — ABNORMAL LOW (ref 3.87–5.11)
WBC: 7.9 10*3/uL (ref 4.0–10.5)

## 2013-09-29 MED ORDER — DARBEPOETIN ALFA-POLYSORBATE 500 MCG/ML IJ SOLN
INTRAMUSCULAR | Status: AC
  Filled 2013-09-29: qty 2

## 2013-09-29 MED ORDER — DARBEPOETIN ALFA-POLYSORBATE 500 MCG/ML IJ SOLN
500.0000 ug | Freq: Once | INTRAMUSCULAR | Status: AC
  Administered 2013-09-29: 500 ug via SUBCUTANEOUS

## 2013-09-29 MED ORDER — DARBEPOETIN ALFA-POLYSORBATE 500 MCG/ML IJ SOLN: 500.0000 ug | Freq: Once | INTRAMUSCULAR | Status: DC

## 2013-09-29 NOTE — Progress Notes (Signed)
Labs drawn today for cbc 

## 2013-09-29 NOTE — Progress Notes (Signed)
Hgb 8.1.  Aranesp 500 mcg given sub-q to left lower abd.  Tolerated well.

## 2013-10-08 ENCOUNTER — Telehealth (HOSPITAL_COMMUNITY): Payer: Self-pay | Admitting: *Deleted

## 2013-10-08 NOTE — Telephone Encounter (Signed)
Patient states that her fingers are numb on both hands from fingertips to knuckles. Patient still able to do ADLs i.e, button buttons, zip zippers, tie shoes. Patient states that tips of toes to half way up the toes of both feet are numb.

## 2013-10-12 NOTE — Progress Notes (Signed)
Autumn Obey, MD [address] [place] [address]  Malignant neoplasm of pancreas, part unspecified  Antineoplastic chemotherapy induced anemia(285.3)  Peripheral neuropathy - Plan: gabapentin (NEURONTIN) 300 MG capsule  CURRENT THERAPY: S/P cycle 2 of Salvage Gemcitabine/Abraxane beginning on 08/18/2013.  Day 15 of chemotherapy has been deleted from all cycles due to thrombocytopenia.  INTERVAL HISTORY: [name] 67 y.o. female returns for  regular  visit for followup of Metastatic pancreatic cancer S/P 5 cycles of FOLFIRINOX chemotherapy with stable disease per CT scans, but recurrent ascites worrisome for progression of disease. She was treated with FOLFIRINOX + Neulasta support from 05/26/2013- 07/21/2013 with a change in therapy to Gemcitabine/Abraxane D1, 8, 15 every 28 days beginning on 08/18/2013. Due to thrombocytopenia for Day 15 of cycle 1, Day 15 of therapy is deleted from all subsequent cycles     I personally reviewed and went over laboratory results with the patient.  Labs from 11/7 shows a WBC of 7.9 with a Hgb that is 8.1 g/dL, and platelet count of 73,000.  We will check pre-chemo labs today in preparation for cycle 2 of salvage chemotherapy.  Most recent CA 19-9 is down to 13,181.7 compared to >14,000 since time of diagnosis.  She is doing well and tolerating therapy without problems.  She denies any abdominal pain or abdominal distension since the switch in therapy.  She does not peripheral neuropathy that encompasses her fingers and toes.  She reports that she is able to button button and zipper zippers, but it is a little difficult for her.  She denies any difficulty with ambulation.  I will start Gabapentin 900 mg in PM.  She was educated about peripheral neuropathy. I will reduce Abraxane dose by 25% this cycle and re-evaluate peripheral neuropathy.    Following cycle 3 of chemotherapy, we will restage her with imaging studies.    Oncologically,  she denies any complaints and ROS questioning is negative.  Past Medical History  Diagnosis Date  . Diabetes mellitus without complication   . Hypertension   . Anxiety   . Arthritis   . Pancreatic cancer   . 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 Peripheral neuropathy 10/13/2013    has Obstructive jaundice; Tobacco abuse; HTN (hypertension); Diabetes mellitus; Malignant neoplasm of pancreas, part unspecified; Antineoplastic chemotherapy induced anemia(285.3); and Peripheral neuropathy on her problem list.     has No Known Allergies.  [name] does not currently have medications on file.  Past Surgical History  Procedure Laterality Date  . Abdominal hysterectomy    . Colonoscopy  2006    Dr. Jena Gauss, polyp, benign  . Ercp  05/10/2013    Procedure: ENDOSCOPIC RETROGRADE CHOLANGIOPANCREATOGRAPHY (ERCP)(DIFFICULT CANNULATION) ;  Surgeon: Malissa Hippo, MD;  Location: [facility];  Service: Endoscopy;;  . Biliary stent placement  05/10/2013    Procedure: BILIARY STENT PLACEMENT ( X WALLFLEX STENT);  Surgeon: Malissa Hippo, MD;  Location: [facility];  Service: Endoscopy;;  . Sphincterotomy  05/10/2013    Procedure: SPHINCTEROTOMY (PRECUT MADE WITH NEEDLE KNIFE);  Surgeon: Malissa Hippo, MD;  Location: [facility];  Service: Endoscopy;;  . Liver biopsy  05/12/2013  . Portacath placement Left 05/23/2013    Procedure: INSERTION PORT-A-CATH;  Surgeon: Fabio Bering, MD;  Location: [facility];  Service: General;  Laterality: Left;    Denies any headaches, dizziness, double vision, fevers, chills, night sweats, nausea, vomiting, diarrhea, constipation, chest pain, heart palpitations, shortness of breath, blood  in stool, black tarry stool, urinary pain, urinary burning, urinary frequency, hematuria.   PHYSICAL EXAMINATION  ECOG PERFORMANCE STATUS: 1 - Symptomatic but completely ambulatory  There were no vitals filed for this visit.  GENERAL:alert, no distress, well  nourished, well developed, comfortable, cooperative and smiling SKIN: skin color, texture, turgor are normal, no rashes or significant lesions HEAD: Normocephalic, No masses, lesions, tenderness or abnormalities EYES: normal, PERRLA, EOMI, Conjunctiva are pink and non-injected EARS: External ears normal OROPHARYNX:mucous membranes are moist  NECK: supple, no adenopathy, trachea midline LYMPH:  no palpable lymphadenopathy BREAST:not examined LUNGS: clear to auscultation and percussion HEART: regular rate & rhythm, no murmurs, no gallops, S1 normal and S2 normal ABDOMEN:abdomen soft, non-tender, normal bowel sounds, no masses or organomegaly and no hepatosplenomegaly BACK: Back symmetric, no curvature. EXTREMITIES:less then 2 second capillary refill, no joint deformities, effusion, or inflammation, no edema, no skin discoloration, no clubbing, no cyanosis  NEURO: alert & oriented x 3 with fluent speech, no focal motor/sensory deficits, gait normal    LABORATORY DATA: CBC    Component Value Date/Time   WBC 9.1 10/13/2013 0841   RBC 3.50* 10/13/2013 0841   RBC 2.71* 07/28/2013 0557   HGB 9.5* 10/13/2013 0841   HCT 31.2* 10/13/2013 0841   PLT 329 10/13/2013 0841   MCV 89.1 10/13/2013 0841   MCH 27.1 10/13/2013 0841   MCHC 30.4 10/13/2013 0841   RDW 18.5* 10/13/2013 0841   LYMPHSABS PENDING 10/13/2013 0841   MONOABS PENDING 10/13/2013 0841   EOSABS PENDING 10/13/2013 0841   BASOSABS PENDING 10/13/2013 0841   Results for [name], [name] (MRN [id number]) as of 09/14/2013 10:39  Ref. Range 09/01/2013 08:42  CA 19-9 Latest Range: <35.0 U/mL 130181.7 (H)    PENDING LABS: CBC diff, CMET, CA 19-9    ASSESSMENT:  1. Metastatic pancreatic cancer S/P 5 cycles of FOLFIRINOX chemotherapy with stable disease per CT scans, but recurrent ascites worrisome for progression of disease. She was treated with FOLFIRINOX + Neulasta support from 05/26/2013- 07/21/2013 with a change in therapy to  Gemcitabine/Abraxane D1, 8, 15 every 28 days beginning on 08/18/2013. Due to thrombocytopenia for Day 15 of cycle 1, Day 15 of therapy is deleted from all subsequent cycles   2. Peripheral neuropathy, grade 2. Will decrease Abraxane by 25% and start Gabapentin 900 mg daily.   Patient Active Problem List   Diagnosis Date Noted  . Peripheral neuropathy 10/13/2013  . 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 Malignant neoplasm of pancreas, part unspecified 05/21/2013  . Obstructive jaundice 05/07/2013  . Tobacco abuse 05/07/2013  . HTN (hypertension) 05/07/2013  . Diabetes mellitus 05/07/2013     PLAN:  1. I personally reviewed and went over laboratory results with the patient. 2. Pre-chemo labs today in preparation for cycle 3. 3. If treatment parameters are met, will move forward with cycle 3 as scheduled.  4. Restaging CT CAP with contrast following cycle 3 of chemotherapy.  Scheduled for 12/8.  5. Pre-chemo labs: CBC diff, CMET, CA 19-9 Day 1 of each cycle. 6. Pre-chemo labs: CBC diff day 8 of each cycle. 7. Influenza vaccine not indicated for [facility] while on active therapy. 8. Continue with supportive therapy plan: Aranesp 500 mcg every 4 weeks.  9. Gabapentin 300 mg #90 with 1 refill. 10. Reduce Abraxane by 25% 11. Return in 3.5 weeks for follow-up to review restaging CT scans, but I will see her in 1 week when she is here for chemotherapy again to evaluate  peripheral neuropathy, if not better, will need to consider a 25% dose reduction in Gemcitabine.    THERAPY PLAN:  Pending treatment parameters are met, will move forward with cycle 2 of salvage gemcitabine/abraxane.  If she does well clinically, then we plan on restaging her with CT scans following cycle 3 of chemotherapy.    All questions were answered. The patient knows to call the clinic with any problems, questions or concerns. We can certainly see the patient much sooner if necessary.  Patient and plan  discussed with Dr. Alla German and he is in agreement with the aforementioned.   More than 50% of the time spent with the patient was utilized for counseling and coordination of care.  KEFALAS,THOMAS

## 2013-10-13 ENCOUNTER — Other Ambulatory Visit (HOSPITAL_COMMUNITY): Payer: Self-pay | Admitting: Hematology and Oncology

## 2013-10-13 ENCOUNTER — Encounter (HOSPITAL_BASED_OUTPATIENT_CLINIC_OR_DEPARTMENT_OTHER): Payer: Medicare Other

## 2013-10-13 ENCOUNTER — Ambulatory Visit (HOSPITAL_COMMUNITY): Payer: Medicare Other

## 2013-10-13 ENCOUNTER — Encounter (HOSPITAL_BASED_OUTPATIENT_CLINIC_OR_DEPARTMENT_OTHER): Payer: Medicare Other | Admitting: Oncology

## 2013-10-13 ENCOUNTER — Encounter (HOSPITAL_COMMUNITY): Payer: Self-pay | Admitting: Hematology and Oncology

## 2013-10-13 ENCOUNTER — Encounter (HOSPITAL_COMMUNITY): Payer: Self-pay | Admitting: Oncology

## 2013-10-13 VITALS — BP 159/85 | HR 82 | Temp 98.4°F | Resp 16 | Wt 152.0 lb

## 2013-10-13 DIAGNOSIS — D6481 Anemia due to antineoplastic chemotherapy: Secondary | ICD-10-CM

## 2013-10-13 DIAGNOSIS — C259 Malignant neoplasm of pancreas, unspecified: Secondary | ICD-10-CM

## 2013-10-13 DIAGNOSIS — G629 Polyneuropathy, unspecified: Secondary | ICD-10-CM

## 2013-10-13 DIAGNOSIS — C787 Secondary malignant neoplasm of liver and intrahepatic bile duct: Secondary | ICD-10-CM

## 2013-10-13 DIAGNOSIS — Z5111 Encounter for antineoplastic chemotherapy: Secondary | ICD-10-CM

## 2013-10-13 DIAGNOSIS — G609 Hereditary and idiopathic neuropathy, unspecified: Secondary | ICD-10-CM

## 2013-10-13 DIAGNOSIS — T451X5A Adverse effect of antineoplastic and immunosuppressive drugs, initial encounter: Secondary | ICD-10-CM

## 2013-10-13 HISTORY — DX: Polyneuropathy, unspecified: G62.9

## 2013-10-13 LAB — COMPREHENSIVE METABOLIC PANEL
AST: 16 U/L (ref 0–37)
Albumin: 2.4 g/dL — ABNORMAL LOW (ref 3.5–5.2)
BUN: 17 mg/dL (ref 6–23)
CO2: 28 mEq/L (ref 19–32)
Chloride: 102 mEq/L (ref 96–112)
Creatinine, Ser: 0.97 mg/dL (ref 0.50–1.10)
GFR calc non Af Amer: 60 mL/min — ABNORMAL LOW (ref >90)
Potassium: 3.8 mEq/L (ref 3.5–5.1)
Total Bilirubin: 0.2 mg/dL — ABNORMAL LOW (ref 0.3–1.2)

## 2013-10-13 LAB — CBC WITH DIFFERENTIAL/PLATELET
Basophils Absolute: 0 10*3/uL (ref 0.0–0.1)
Eosinophils Relative: 3 % (ref 0–5)
MCH: 27.1 pg (ref 26.0–34.0)
Monocytes Absolute: 1.1 10*3/uL — ABNORMAL HIGH (ref 0.1–1.0)
Monocytes Relative: 12 % (ref 3–12)
Neutrophils Relative %: 64 % (ref 43–77)
Platelets: 329 10*3/uL (ref 150–400)
RBC: 3.5 MIL/uL — ABNORMAL LOW (ref 3.87–5.11)
RDW: 18.5 % — ABNORMAL HIGH (ref 11.5–15.5)
WBC: 9.1 10*3/uL (ref 4.0–10.5)

## 2013-10-13 MED ORDER — SODIUM CHLORIDE 0.9 % IV SOLN: 8.0000 mg | Freq: Once | INTRAVENOUS | Status: DC

## 2013-10-13 MED ORDER — DEXAMETHASONE SODIUM PHOSPHATE 10 MG/ML IJ SOLN: 10.0000 mg | Freq: Once | INTRAMUSCULAR | Status: DC

## 2013-10-13 MED ORDER — HEPARIN SOD (PORK) LOCK FLUSH 100 UNIT/ML IV SOLN
500.0000 [IU] | Freq: Once | INTRAVENOUS | Status: AC | PRN
  Administered 2013-10-13: 500 [IU]

## 2013-10-13 MED ORDER — SODIUM CHLORIDE 0.9 % IV SOLN
Freq: Once | INTRAVENOUS | Status: AC
  Administered 2013-10-13: 11:00:00 via INTRAVENOUS

## 2013-10-13 MED ORDER — SODIUM CHLORIDE 0.9 % IV SOLN
Freq: Once | INTRAVENOUS | Status: AC
  Administered 2013-10-13: 8 mg via INTRAVENOUS
  Filled 2013-10-13: qty 4

## 2013-10-13 MED ORDER — GABAPENTIN 300 MG PO CAPS: ORAL_CAPSULE | ORAL | Status: DC

## 2013-10-13 MED ORDER — HEPARIN SOD (PORK) LOCK FLUSH 100 UNIT/ML IV SOLN
INTRAVENOUS | Status: AC
  Filled 2013-10-13: qty 5

## 2013-10-13 MED ORDER — SODIUM CHLORIDE 0.9 % IV SOLN
1000.0000 mg/m2 | Freq: Once | INTRAVENOUS | Status: AC
  Administered 2013-10-13: 1900 mg via INTRAVENOUS
  Filled 2013-10-13: qty 49.97

## 2013-10-13 MED ORDER — SODIUM CHLORIDE 0.9 % IJ SOLN
10.0000 mL | INTRAMUSCULAR | Status: DC | PRN
  Administered 2013-10-13: 10 mL

## 2013-10-13 MED ORDER — PACLITAXEL PROTEIN-BOUND CHEMO INJECTION 100 MG
93.7500 mg/m2 | Freq: Once | INTRAVENOUS | Status: AC
  Administered 2013-10-13: 175 mg via INTRAVENOUS
  Filled 2013-10-13: qty 35

## 2013-10-13 NOTE — Progress Notes (Signed)
Tolerated chemo well today.port access flushed and d/c per protocol.

## 2013-10-13 NOTE — Progress Notes (Signed)
Saw PA today in exam.

## 2013-10-13 NOTE — Patient Instructions (Signed)
[facility] Discharge Instructions  RECOMMENDATIONS MADE BY THE CONSULTANT AND ANY TEST RESULTS WILL BE SENT TO YOUR REFERRING PHYSICIAN.  EXAM FINDINGS BY THE PHYSICIAN TODAY AND SIGNS OR SYMPTOMS TO REPORT TO CLINIC OR PRIMARY PHYSICIAN: Exam and findings as discussed by T. Kefalas, PA-C.  INSTRUCTIONS/FOLLOW-UP: 1.  Pre-chemo labs were done today. 2.  Please keep your CT appointment for December 8th at 10:15.  Instructions for drinking contrast are below, and you were given your contrast today. 3.  Continue with Aranesp as planned every 4 weeks if indicated. 4.  You're scheduled to see the MD again on 11/10/13 at 09:30 to review your CT results for re-staging.  Please contact our office sooner if questions or concerns.  [facility] Radiology Department  Instructions for CT Abdomen/Pelvis  [name], your exam is scheduled for Monday at 1015  in the morning.  **WARNING** IF YOU ARE ALLERGIC TO IODINE/X-RAY DYE, PLEASE NOTIFY us IMMEDIATELY.  YOU MAY NEED ADDITIONAL PRE-MEDICATIONS THE DAY PRIOR TO YOUR EXAM.   Please follow these instructions on the day of your exam.   Do not eat or drink anything after 0800.  (2 hours prior to the exam time)   You will be given two bottles of ReadiCat oral contrast solution to drink.  The solution may taste better if refrigerated, but do not add ice.  SHAKE WELL BEFORE DRINKING   Drink one bottle of ReadiCat (barium solution) at 0800.  (2 hours prior to your exam)   Drink one bottle of ReadiCat (barium solution) at 0900.  (1 hour prior to your exam)   You may take any medications as prescribed, with a small amount of water, if necessary.   Please arrive 30 minutes prior to appointment time to register.   Come in and report to Short Stay to register.   If registering in any area other than Radiology, notify the Radiology front office upon your arrival in the Radiology Department and they will  notify the CT Department.  The purpose of you drinking the oral contrast solution (ReadiCat) is to aid in the visualization of your intestinal tract.  The contrast solution may cause some diarrhea. Before your exam is started, you will be given a small amount of water to drink.  Depending on your individual set of symptoms, you may also receive an intravenous injection of x-ray contrast/iodine.  Plan on being in Radiology for 30-60 minutes or longer, depending on the type of exam you are having performed.  If you have any questions regarding your procedure, you may call the CT Department at [phone].    Thank you for choosing [facility] to provide your oncology and hematology care.  To afford each patient quality time with our providers, please arrive at least 15 minutes before your scheduled appointment time.  With your help, our goal is to use those 15 minutes to complete the necessary work-up to ensure our physicians have the information they need to help with your evaluation and healthcare recommendations.    Effective January 1st, 2014, we ask that you re-schedule your appointment with our physicians should you arrive 10 or more minutes late for your appointment.  We strive to give you quality time with our providers, and arriving late affects you and other patients whose appointments are after yours.    Again, thank you for choosing [facility].  Our hope is that these requests will decrease the amount of time  that you wait before being seen by our physicians.       _____________________________________________________________  Should you have questions after your visit to [facility], please contact our office at [phone] between the hours of 8:30 a.m. and 5:00 p.m.  Voicemails left after 4:30 p.m. will not be returned until the following business day.  For prescription refill requests, have your pharmacy contact our office with your prescription  refill request.

## 2013-10-14 LAB — CANCER ANTIGEN 19-9: CA 19-9: 64006.2 U/mL — ABNORMAL HIGH (ref <35.0)

## 2013-10-20 ENCOUNTER — Encounter (HOSPITAL_BASED_OUTPATIENT_CLINIC_OR_DEPARTMENT_OTHER): Payer: Medicare Other

## 2013-10-20 VITALS — BP 154/94 | HR 89 | Temp 98.4°F | Resp 16 | Wt 153.4 lb

## 2013-10-20 DIAGNOSIS — C259 Malignant neoplasm of pancreas, unspecified: Secondary | ICD-10-CM

## 2013-10-20 DIAGNOSIS — Z5111 Encounter for antineoplastic chemotherapy: Secondary | ICD-10-CM

## 2013-10-20 LAB — CBC WITH DIFFERENTIAL/PLATELET
Eosinophils Absolute: 0.4 10*3/uL (ref 0.0–0.7)
Eosinophils Relative: 5 % (ref 0–5)
Hemoglobin: 9.2 g/dL — ABNORMAL LOW (ref 12.0–15.0)
Lymphs Abs: 1.8 10*3/uL (ref 0.7–4.0)
MCH: 27.2 pg (ref 26.0–34.0)
MCV: 88.5 fL (ref 78.0–100.0)
Monocytes Relative: 9 % (ref 3–12)
Neutro Abs: 4.8 10*3/uL (ref 1.7–7.7)
Neutrophils Relative %: 63 % (ref 43–77)
RBC: 3.38 MIL/uL — ABNORMAL LOW (ref 3.87–5.11)

## 2013-10-20 LAB — COMPREHENSIVE METABOLIC PANEL
AST: 16 U/L (ref 0–37)
Albumin: 2.4 g/dL — ABNORMAL LOW (ref 3.5–5.2)
CO2: 27 mEq/L (ref 19–32)
Calcium: 9.1 mg/dL (ref 8.4–10.5)
Creatinine, Ser: 1.02 mg/dL (ref 0.50–1.10)
GFR calc Af Amer: 65 mL/min — ABNORMAL LOW (ref >90)
GFR calc non Af Amer: 56 mL/min — ABNORMAL LOW (ref >90)
Total Protein: 7.2 g/dL (ref 6.0–8.3)

## 2013-10-20 MED ORDER — DEXAMETHASONE SODIUM PHOSPHATE 10 MG/ML IJ SOLN: 10.0000 mg | Freq: Once | INTRAMUSCULAR | Status: DC

## 2013-10-20 MED ORDER — SODIUM CHLORIDE 0.9 % IV SOLN
Freq: Once | INTRAVENOUS | Status: AC
  Administered 2013-10-20: 8 mg via INTRAVENOUS
  Filled 2013-10-20: qty 4

## 2013-10-20 MED ORDER — PACLITAXEL PROTEIN-BOUND CHEMO INJECTION 100 MG
93.7500 mg/m2 | Freq: Once | INTRAVENOUS | Status: AC
  Administered 2013-10-20: 175 mg via INTRAVENOUS
  Filled 2013-10-20: qty 35

## 2013-10-20 MED ORDER — SODIUM CHLORIDE 0.9 % IJ SOLN
10.0000 mL | INTRAMUSCULAR | Status: DC | PRN
  Administered 2013-10-20: 10 mL

## 2013-10-20 MED ORDER — SODIUM CHLORIDE 0.9 % IV SOLN
Freq: Once | INTRAVENOUS | Status: AC
  Administered 2013-10-20: 10:00:00 via INTRAVENOUS

## 2013-10-20 MED ORDER — SODIUM CHLORIDE 0.9 % IV SOLN
1000.0000 mg/m2 | Freq: Once | INTRAVENOUS | Status: AC
  Administered 2013-10-20: 1900 mg via INTRAVENOUS
  Filled 2013-10-20: qty 49.97

## 2013-10-20 MED ORDER — SODIUM CHLORIDE 0.9 % IV SOLN: 8.0000 mg | Freq: Once | INTRAVENOUS | Status: DC

## 2013-10-20 MED ORDER — HEPARIN SOD (PORK) LOCK FLUSH 100 UNIT/ML IV SOLN
500.0000 [IU] | Freq: Once | INTRAVENOUS | Status: AC | PRN
  Administered 2013-10-20: 500 [IU]
  Filled 2013-10-20: qty 5

## 2013-10-20 NOTE — Progress Notes (Signed)
Spoke with Dr.Formanek about pt's c/o intermittent swelling and pain left lower side that comes and goes and is not relieved with pain medication. Informed pt that per Dr.Formanek she is experiencing pain from adhesions. Instruct per T.Kefalas,PA she make double her pain medication when she has this pain to see if it will help relieve pain.

## 2013-10-27 ENCOUNTER — Other Ambulatory Visit (HOSPITAL_COMMUNITY): Payer: Self-pay | Admitting: Oncology

## 2013-10-27 ENCOUNTER — Encounter (HOSPITAL_COMMUNITY): Payer: Medicare Other | Attending: Oncology

## 2013-10-27 VITALS — BP 140/78 | HR 95 | Temp 98.6°F | Resp 20 | Wt 155.0 lb

## 2013-10-27 DIAGNOSIS — C259 Malignant neoplasm of pancreas, unspecified: Secondary | ICD-10-CM | POA: Insufficient documentation

## 2013-10-27 DIAGNOSIS — K750 Abscess of liver: Secondary | ICD-10-CM | POA: Insufficient documentation

## 2013-10-27 DIAGNOSIS — E876 Hypokalemia: Secondary | ICD-10-CM

## 2013-10-27 DIAGNOSIS — R609 Edema, unspecified: Secondary | ICD-10-CM

## 2013-10-27 DIAGNOSIS — Z01818 Encounter for other preprocedural examination: Secondary | ICD-10-CM | POA: Insufficient documentation

## 2013-10-27 DIAGNOSIS — D6481 Anemia due to antineoplastic chemotherapy: Secondary | ICD-10-CM | POA: Insufficient documentation

## 2013-10-27 DIAGNOSIS — B373 Candidiasis of vulva and vagina: Secondary | ICD-10-CM | POA: Insufficient documentation

## 2013-10-27 DIAGNOSIS — B3731 Acute candidiasis of vulva and vagina: Secondary | ICD-10-CM | POA: Insufficient documentation

## 2013-10-27 LAB — CBC
HCT: 31.5 % — ABNORMAL LOW (ref 36.0–46.0)
Hemoglobin: 9.9 g/dL — ABNORMAL LOW (ref 12.0–15.0)
MCH: 27.1 pg (ref 26.0–34.0)
MCV: 86.3 fL (ref 78.0–100.0)
Platelets: 97 10*3/uL — ABNORMAL LOW (ref 150–400)
RBC: 3.65 MIL/uL — ABNORMAL LOW (ref 3.87–5.11)
RDW: 18.2 % — ABNORMAL HIGH (ref 11.5–15.5)
WBC: 13.5 10*3/uL — ABNORMAL HIGH (ref 4.0–10.5)

## 2013-10-27 MED ORDER — POTASSIUM CHLORIDE CRYS ER 20 MEQ PO TBCR: 20.0000 meq | EXTENDED_RELEASE_TABLET | Freq: Every day | ORAL | Status: DC

## 2013-10-27 MED ORDER — FUROSEMIDE 40 MG PO TABS: 40.0000 mg | ORAL_TABLET | Freq: Every day | ORAL | Status: DC

## 2013-10-27 MED ORDER — DARBEPOETIN ALFA-POLYSORBATE 500 MCG/ML IJ SOLN
500.0000 ug | Freq: Once | INTRAMUSCULAR | Status: AC
  Administered 2013-10-27: 500 ug via SUBCUTANEOUS
  Filled 2013-10-27: qty 1

## 2013-10-27 MED ORDER — DEXAMETHASONE 4 MG PO TABS: ORAL_TABLET | ORAL | Status: DC

## 2013-10-27 NOTE — Progress Notes (Signed)
[name] presents today for injection per the provider's orders.  Aranesp administered SQ;  administration without incident; see MAR for injection details.  Patient tolerated procedure well and without incident.  No questions or complaints noted at this time.

## 2013-10-27 NOTE — Progress Notes (Signed)
[name] presented for labwork. Labs per MD order drawn via Peripheral Line 23 gauge needle inserted in rt ac Good blood return present. Procedure without incident.  Needle removed intact. Patient tolerated procedure well.

## 2013-10-29 ENCOUNTER — Encounter (HOSPITAL_COMMUNITY): Payer: Self-pay | Admitting: Emergency Medicine

## 2013-10-29 ENCOUNTER — Other Ambulatory Visit: Payer: Self-pay

## 2013-10-29 ENCOUNTER — Emergency Department (HOSPITAL_COMMUNITY): Payer: Medicare Other

## 2013-10-29 ENCOUNTER — Inpatient Hospital Stay (HOSPITAL_COMMUNITY)
Admission: EM | Admit: 2013-10-29 | Discharge: 2013-11-01 | DRG: 872 | Disposition: A | Discharge status: Home / Self Care | Payer: Medicare Other | Attending: Family Medicine | Admitting: Family Medicine

## 2013-10-29 DIAGNOSIS — Z8249 Family history of ischemic heart disease and other diseases of the circulatory system: Secondary | ICD-10-CM

## 2013-10-29 DIAGNOSIS — J069 Acute upper respiratory infection, unspecified: Secondary | ICD-10-CM | POA: Diagnosis present

## 2013-10-29 DIAGNOSIS — R509 Fever, unspecified: Secondary | ICD-10-CM | POA: Diagnosis present

## 2013-10-29 DIAGNOSIS — Z9221 Personal history of antineoplastic chemotherapy: Secondary | ICD-10-CM

## 2013-10-29 DIAGNOSIS — R6510 Systemic inflammatory response syndrome (SIRS) of non-infectious origin without acute organ dysfunction: Secondary | ICD-10-CM | POA: Diagnosis present

## 2013-10-29 DIAGNOSIS — M129 Arthropathy, unspecified: Secondary | ICD-10-CM | POA: Diagnosis present

## 2013-10-29 DIAGNOSIS — A419 Sepsis, unspecified organism: Principal | ICD-10-CM | POA: Diagnosis present

## 2013-10-29 DIAGNOSIS — Z808 Family history of malignant neoplasm of other organs or systems: Secondary | ICD-10-CM

## 2013-10-29 DIAGNOSIS — K831 Obstruction of bile duct: Secondary | ICD-10-CM | POA: Diagnosis present

## 2013-10-29 DIAGNOSIS — I10 Essential (primary) hypertension: Secondary | ICD-10-CM | POA: Diagnosis present

## 2013-10-29 DIAGNOSIS — E119 Type 2 diabetes mellitus without complications: Secondary | ICD-10-CM | POA: Diagnosis present

## 2013-10-29 DIAGNOSIS — A498 Other bacterial infections of unspecified site: Secondary | ICD-10-CM | POA: Diagnosis present

## 2013-10-29 DIAGNOSIS — Z87891 Personal history of nicotine dependence: Secondary | ICD-10-CM

## 2013-10-29 DIAGNOSIS — D899 Disorder involving the immune mechanism, unspecified: Secondary | ICD-10-CM | POA: Diagnosis present

## 2013-10-29 DIAGNOSIS — F411 Generalized anxiety disorder: Secondary | ICD-10-CM | POA: Diagnosis present

## 2013-10-29 DIAGNOSIS — C259 Malignant neoplasm of pancreas, unspecified: Secondary | ICD-10-CM | POA: Diagnosis present

## 2013-10-29 DIAGNOSIS — G609 Hereditary and idiopathic neuropathy, unspecified: Secondary | ICD-10-CM | POA: Diagnosis present

## 2013-10-29 DIAGNOSIS — Z794 Long term (current) use of insulin: Secondary | ICD-10-CM

## 2013-10-29 LAB — PROTIME-INR: Prothrombin Time: 16.6 seconds — ABNORMAL HIGH (ref 11.6–15.2)

## 2013-10-29 LAB — CBC WITH DIFFERENTIAL/PLATELET
Basophils Absolute: 0 10*3/uL (ref 0.0–0.1)
Basophils Relative: 0 % (ref 0–1)
Eosinophils Absolute: 0.2 10*3/uL (ref 0.0–0.7)
Eosinophils Relative: 2 % (ref 0–5)
HCT: 29.6 % — ABNORMAL LOW (ref 36.0–46.0)
Hemoglobin: 9.5 g/dL — ABNORMAL LOW (ref 12.0–15.0)
Lymphs Abs: 0.4 10*3/uL — ABNORMAL LOW (ref 0.7–4.0)
MCH: 27.4 pg (ref 26.0–34.0)
MCHC: 32.1 g/dL (ref 30.0–36.0)
MCV: 85.3 fL (ref 78.0–100.0)
Monocytes Absolute: 0.2 10*3/uL (ref 0.1–1.0)
Neutro Abs: 7.3 10*3/uL (ref 1.7–7.7)
Neutrophils Relative %: 91 % — ABNORMAL HIGH (ref 43–77)

## 2013-10-29 LAB — HEPATIC FUNCTION PANEL
ALT: 20 U/L (ref 0–35)
AST: 26 U/L (ref 0–37)
Alkaline Phosphatase: 223 U/L — ABNORMAL HIGH (ref 39–117)
Bilirubin, Direct: 0.4 mg/dL — ABNORMAL HIGH (ref 0.0–0.3)
Indirect Bilirubin: 0.4 mg/dL (ref 0.3–0.9)
Total Bilirubin: 0.8 mg/dL (ref 0.3–1.2)
Total Protein: 7.1 g/dL (ref 6.0–8.3)

## 2013-10-29 LAB — BASIC METABOLIC PANEL
CO2: 23 mEq/L (ref 19–32)
Calcium: 8.7 mg/dL (ref 8.4–10.5)
Chloride: 98 mEq/L (ref 96–112)
Creatinine, Ser: 1.1 mg/dL (ref 0.50–1.10)
GFR calc Af Amer: 60 mL/min — ABNORMAL LOW (ref >90)
Glucose, Bld: 106 mg/dL — ABNORMAL HIGH (ref 70–99)

## 2013-10-29 LAB — URINALYSIS, ROUTINE W REFLEX MICROSCOPIC
Bilirubin Urine: NEGATIVE
Leukocytes, UA: NEGATIVE
Nitrite: NEGATIVE
Specific Gravity, Urine: 1.015 (ref 1.005–1.030)
Urobilinogen, UA: 1 mg/dL (ref 0.0–1.0)
pH: 6 (ref 5.0–8.0)

## 2013-10-29 LAB — URINE MICROSCOPIC-ADD ON

## 2013-10-29 LAB — GLUCOSE, CAPILLARY: Glucose-Capillary: 65 mg/dL — ABNORMAL LOW (ref 70–99)

## 2013-10-29 MED ORDER — SODIUM CHLORIDE 0.9 % IV SOLN
INTRAVENOUS | Status: DC
  Administered 2013-10-29: 750 mL via INTRAVENOUS

## 2013-10-29 MED ORDER — ALBUTEROL SULFATE (5 MG/ML) 0.5% IN NEBU: 2.5000 mg | INHALATION_SOLUTION | RESPIRATORY_TRACT | Status: DC | PRN

## 2013-10-29 MED ORDER — OXYCODONE HCL 5 MG PO TABS
5.0000 mg | ORAL_TABLET | ORAL | Status: DC | PRN
  Administered 2013-10-29 – 2013-11-01 (×5): 5 mg via ORAL
  Filled 2013-10-29 (×5): qty 1

## 2013-10-29 MED ORDER — SODIUM CHLORIDE 0.9 % IV BOLUS (SEPSIS): 500.0000 mL | Freq: Once | INTRAVENOUS | Status: DC

## 2013-10-29 MED ORDER — ACETAMINOPHEN 650 MG RE SUPP: 650.0000 mg | Freq: Four times a day (QID) | RECTAL | Status: DC | PRN

## 2013-10-29 MED ORDER — ENOXAPARIN SODIUM 40 MG/0.4ML ~~LOC~~ SOLN
40.0000 mg | SUBCUTANEOUS | Status: DC
  Administered 2013-10-29 – 2013-10-31 (×3): 40 mg via SUBCUTANEOUS
  Filled 2013-10-29 (×3): qty 0.4

## 2013-10-29 MED ORDER — INSULIN ASPART 100 UNIT/ML ~~LOC~~ SOLN
0.0000 [IU] | Freq: Three times a day (TID) | SUBCUTANEOUS | Status: DC
  Administered 2013-10-30: 9 [IU] via SUBCUTANEOUS
  Administered 2013-10-30 (×2): 1 [IU] via SUBCUTANEOUS
  Administered 2013-10-31 (×2): 3 [IU] via SUBCUTANEOUS
  Administered 2013-10-31: 7 [IU] via SUBCUTANEOUS
  Administered 2013-11-01 (×2): 3 [IU] via SUBCUTANEOUS

## 2013-10-29 MED ORDER — SODIUM CHLORIDE 0.9 % IV BOLUS (SEPSIS)
250.0000 mL | Freq: Once | INTRAVENOUS | Status: AC
  Administered 2013-10-29: 250 mL via INTRAVENOUS

## 2013-10-29 MED ORDER — SODIUM CHLORIDE 0.9 % IV BOLUS (SEPSIS)
500.0000 mL | Freq: Once | INTRAVENOUS | Status: AC
  Administered 2013-10-29: 500 mL via INTRAVENOUS

## 2013-10-29 MED ORDER — ONDANSETRON HCL 4 MG PO TABS: 4.0000 mg | ORAL_TABLET | Freq: Four times a day (QID) | ORAL | Status: DC | PRN

## 2013-10-29 MED ORDER — ACETAMINOPHEN 325 MG PO TABS
650.0000 mg | ORAL_TABLET | Freq: Four times a day (QID) | ORAL | Status: DC | PRN
  Administered 2013-10-30 – 2013-11-01 (×3): 650 mg via ORAL
  Filled 2013-10-29 (×3): qty 2

## 2013-10-29 MED ORDER — VANCOMYCIN HCL 500 MG IV SOLR
500.0000 mg | Freq: Two times a day (BID) | INTRAVENOUS | Status: DC
  Filled 2013-10-29 (×3): qty 500

## 2013-10-29 MED ORDER — INSULIN GLARGINE 100 UNIT/ML ~~LOC~~ SOLN
20.0000 [IU] | Freq: Every day | SUBCUTANEOUS | Status: DC
  Filled 2013-10-29 (×5): qty 0.2

## 2013-10-29 MED ORDER — VANCOMYCIN HCL IN DEXTROSE 1-5 GM/200ML-% IV SOLN: 1000.0000 mg | Freq: Once | INTRAVENOUS | Status: DC

## 2013-10-29 MED ORDER — SODIUM CHLORIDE 0.9 % IV SOLN
INTRAVENOUS | Status: DC
  Administered 2013-10-29 – 2013-11-01 (×4): via INTRAVENOUS

## 2013-10-29 MED ORDER — PIPERACILLIN-TAZOBACTAM 3.375 G IVPB
INTRAVENOUS | Status: AC
  Filled 2013-10-29: qty 50

## 2013-10-29 MED ORDER — ACETAMINOPHEN 325 MG PO TABS
650.0000 mg | ORAL_TABLET | Freq: Once | ORAL | Status: AC
  Administered 2013-10-29: 650 mg via ORAL
  Filled 2013-10-29: qty 2

## 2013-10-29 MED ORDER — PIPERACILLIN-TAZOBACTAM 3.375 G IVPB: 3.3750 g | INTRAVENOUS | Status: AC

## 2013-10-29 MED ORDER — PIPERACILLIN-TAZOBACTAM 3.375 G IVPB
3.3750 g | Freq: Three times a day (TID) | INTRAVENOUS | Status: DC
  Administered 2013-10-30 – 2013-11-01 (×8): 3.375 g via INTRAVENOUS
  Filled 2013-10-29 (×14): qty 50

## 2013-10-29 MED ORDER — ONDANSETRON HCL 4 MG/2ML IJ SOLN: 4.0000 mg | Freq: Four times a day (QID) | INTRAMUSCULAR | Status: DC | PRN

## 2013-10-29 MED ORDER — PANTOPRAZOLE SODIUM 40 MG PO TBEC
40.0000 mg | DELAYED_RELEASE_TABLET | Freq: Every day | ORAL | Status: DC
  Administered 2013-10-30 – 2013-11-01 (×3): 40 mg via ORAL
  Filled 2013-10-29 (×3): qty 1

## 2013-10-29 MED ORDER — ALPRAZOLAM 1 MG PO TABS
1.0000 mg | ORAL_TABLET | Freq: Three times a day (TID) | ORAL | Status: DC | PRN
  Administered 2013-10-29 – 2013-10-31 (×3): 1 mg via ORAL
  Filled 2013-10-29 (×3): qty 1

## 2013-10-29 NOTE — ED Notes (Signed)
Pt warm to the touch, placed pt on neutropenic precautions until blood work examined.

## 2013-10-29 NOTE — ED Notes (Signed)
Pt states she had 325mg  of tylenol pta.

## 2013-10-29 NOTE — ED Notes (Signed)
Pt c/o chills, fever, and "shakes" since last night. Pt denies any pain, SOB and denies NVD. Pt has hx of pancreatic cancer.

## 2013-10-29 NOTE — ED Provider Notes (Signed)
CSN: [id number]     Arrival date & time 10/29/13  1443 History   First MD Initiated Contact with Patient 10/29/13 1450     Chief Complaint  Patient presents with  . Chills  . Fever    HPI Pt was seen at 1455. Per pt, c/o gradual onset and persistence of constant "shaking chills" since last night. States she took one tylenol tablet today for her symptoms without relief. States she did not take her temperature. Endorses hx of pancreatic CA with LD IV chemotherapy on 10/20/13. Denies any other complaints. Denies CP/palpitations, no SOB/cough, no abd pain, no N/V/D, no back pain, no rash.    Past Medical History  Diagnosis Date  . Diabetes mellitus without complication   . Hypertension   . Anxiety   . Arthritis   . Pancreatic cancer   . 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 Peripheral neuropathy 10/13/2013   Past Surgical History  Procedure Laterality Date  . Abdominal hysterectomy    . Colonoscopy  2006    Dr. Jena Gauss, polyp, benign  . Ercp  05/10/2013    Procedure: ENDOSCOPIC RETROGRADE CHOLANGIOPANCREATOGRAPHY (ERCP)(DIFFICULT CANNULATION) ;  Surgeon: Malissa Hippo, MD;  Location: [facility];  Service: Endoscopy;;  . Biliary stent placement  05/10/2013    Procedure: BILIARY STENT PLACEMENT ( X WALLFLEX STENT);  Surgeon: Malissa Hippo, MD;  Location: [facility];  Service: Endoscopy;;  . Sphincterotomy  05/10/2013    Procedure: SPHINCTEROTOMY (PRECUT MADE WITH NEEDLE KNIFE);  Surgeon: Malissa Hippo, MD;  Location: [facility];  Service: Endoscopy;;  . Liver biopsy  05/12/2013  . Portacath placement Left 05/23/2013    Procedure: INSERTION PORT-A-CATH;  Surgeon: Fabio Bering, MD;  Location: [facility];  Service: General;  Laterality: Left;   Family History  Problem Relation Age of Onset  . Colon cancer Neg Hx   . Liver disease Neg Hx   . Bone cancer Brother   . Cancer Brother   . Hypertension Mother    History  Substance Use Topics  . Smoking status:  Former Smoker -- 1.00 packs/day for 20 years  . Smokeless tobacco: Former Neurosurgeon    Quit date: 03/27/2013  . Alcohol Use: No   OB History   Grav Para Term Preterm Abortions TAB SAB Ect Mult Living   5 5 5       5      Review of Systems ROS: Statement: All systems negative except as marked or noted in the HPI; Constitutional: +fever and chills. ; ; Eyes: Negative for eye pain, redness and discharge. ; ; ENMT: Negative for ear pain, hoarseness, nasal congestion, sinus pressure and sore throat. ; ; Cardiovascular: Negative for chest pain, palpitations, diaphoresis, dyspnea and peripheral edema. ; ; Respiratory: Negative for cough, wheezing and stridor. ; ; Gastrointestinal: Negative for nausea, vomiting, diarrhea, abdominal pain, blood in stool, hematemesis, jaundice and rectal bleeding. . ; ; Genitourinary: Negative for dysuria, flank pain and hematuria. ; ; Musculoskeletal: Negative for back pain and neck pain. Negative for swelling and trauma.; ; Skin: Negative for pruritus, rash, abrasions, blisters, bruising and skin lesion.; ; Neuro: Negative for headache, lightheadedness and neck stiffness. Negative for weakness, altered level of consciousness , altered mental status, extremity weakness, paresthesias, involuntary movement, seizure and syncope.       Allergies  Review of patient's allergies indicates no known allergies.  Home Medications   Current Outpatient Rx  Name  Route  Sig  Dispense  Refill  .  ALPRAZolam (XANAX) 1 MG tablet   Oral   Take 1 mg by mouth 3 (three) times daily as needed for sleep or anxiety.         Marland Kitchen amLODipine (NORVASC) 5 MG tablet   Oral   Take 5 mg by mouth daily.         Marland Kitchen dexamethasone (DECADRON) 4 MG tablet      Take 1 tablet in the am and 1 tablet in the pm x 2 days after chemo.   30 tablet   0   . furosemide (LASIX) 40 MG tablet   Oral   Take 1 tablet (40 mg total) by mouth daily.   30 tablet   1   . gabapentin (NEURONTIN) 300 MG capsule       Take 1 tab at HS x 3 days, then 2 tab at HS x 3 days, then 3 tabs at HS thereafter   90 capsule   1   . Gemcitabine HCl (GEMZAR IV)   Intravenous   Inject into the vein. Day 1, 8, 15 every 28 days         . HYDROCODONE-ACETAMINOPHEN PO   Oral   Take 1 tablet by mouth as needed (only takes when knee is hurting - averages 2 x weekly.).         Marland Kitchen insulin glargine (LANTUS) 100 UNIT/ML injection   Subcutaneous   Inject 20 Units into the skin at bedtime.          Marland Kitchen losartan (COZAAR) 100 MG tablet   Oral   Take 100 mg by mouth daily.         Marland Kitchen omeprazole (PRILOSEC) 20 MG capsule   Oral   Take 1 capsule (20 mg total) by mouth daily.   30 capsule   1   . PACLitaxel Protein-Bound Part (ABRAXANE IV)   Intravenous   Inject into the vein. Day 1, 8, 15 every 28 days         . polyethylene glycol powder (GLYCOLAX/MIRALAX) powder   Oral   Take 17 g by mouth daily.         . potassium chloride SA (K-DUR,KLOR-CON) 20 MEQ tablet   Oral   Take 1 tablet (20 mEq total) by mouth daily.   30 tablet   0   . senna (SENOKOT) 8.6 MG tablet   Oral   Take 1 tablet by mouth daily.         . heparin lock flush 100 UNIT/ML SOLN injection   Intracatheter   5 mLs (500 Units total) by Intracatheter route daily as needed [facility]).   50 mL   1   . LORazepam (ATIVAN) 0.5 MG tablet   Oral   Take 0.5 mg by mouth. May take 1 -2 tabs every 6 hours IF needed for nausea/vomiting. Start with 1 tab and increase to 2 tabs if needed. (Do not take Xanax within 2 hours of taking Ativan).          BP 119/59  Pulse 154  Temp(Src) 105.2 F (40.7 C) (Rectal)  Resp 26  SpO2 100% Physical Exam 1500: Physical examination:  Nursing notes reviewed; Vital signs and O2 SAT reviewed; +febrile;; Constitutional: Well developed, Well nourished, Well hydrated, In no acute distress; Head:  Normocephalic, atraumatic; Eyes: EOMI, PERRL, No scleral icterus; ENMT: Mouth and pharynx normal, Mucous  membranes moist; Neck: Supple, Full range of motion, No lymphadenopathy; Cardiovascular: Tachycardic rate and rhythm, No gallop; Respiratory: Breath sounds clear &  equal bilaterally, No rales, rhonchi, wheezes.  Speaking full sentences with ease, Normal respiratory effort/excursion; Chest: Nontender, Movement normal; Abdomen: Soft, Nontender, Nondistended, Normal bowel sounds; Genitourinary: No CVA tenderness; Extremities: Pulses normal, No tenderness, No edema, No calf edema or asymmetry.; Neuro: AA&Ox3, Major CN grossly intact.  Speech clear. No gross focal motor or sensory deficits in extremities.; Skin: Color normal, Warm, Dry.   ED Course  Procedures   EKG Interpretation   None       MDM  MDM Reviewed: previous chart, nursing note and vitals Reviewed previous: labs and ECG Interpretation: labs, ECG and x-ray      Date: 10/29/2013  Rate: 151  Rhythm: sinus tachycardia  QRS Axis: normal  Intervals: normal  ST/T Wave abnormalities: nonspecific T wave changes  Conduction Disutrbances:none  Narrative Interpretation:   Old EKG Reviewed: unchanged; no significant changes from previous EKG dated 05/21/2013.  Results for orders placed during the hospital encounter of 10/29/13  CBC WITH DIFFERENTIAL      Result Value Range   WBC 8.1  4.0 - 10.5 K/uL   RBC 3.47 (*) 3.87 - 5.11 MIL/uL   Hemoglobin 9.5 (*) 12.0 - 15.0 g/dL   HCT 82.9 (*) 56.2 - 13.0 %   MCV 85.3  78.0 - 100.0 fL   MCH 27.4  26.0 - 34.0 pg   MCHC 32.1  30.0 - 36.0 g/dL   RDW 86.5 (*) 78.4 - 69.6 %   Platelets 129 (*) 150 - 400 K/uL   Neutrophils Relative % 91 (*) 43 - 77 %   Lymphocytes Relative 5 (*) 12 - 46 %   Monocytes Relative 2 (*) 3 - 12 %   Eosinophils Relative 2  0 - 5 %   Basophils Relative 0  0 - 1 %   Neutro Abs 7.3  1.7 - 7.7 K/uL   Lymphs Abs 0.4 (*) 0.7 - 4.0 K/uL   Monocytes Absolute 0.2  0.1 - 1.0 K/uL   Eosinophils Absolute 0.2  0.0 - 0.7 K/uL   Basophils Absolute 0.0  0.0 - 0.1 K/uL   WBC  Morphology MILD LEFT SHIFT (1-5% METAS, OCC MYELO, OCC BANDS)     Smear Review LARGE PLATELETS PRESENT    BASIC METABOLIC PANEL      Result Value Range   Sodium 134 (*) 135 - 145 mEq/L   Potassium 3.6  3.5 - 5.1 mEq/L   Chloride 98  96 - 112 mEq/L   CO2 23  19 - 32 mEq/L   Glucose, Bld 106 (*) 70 - 99 mg/dL   BUN 21  6 - 23 mg/dL   Creatinine, Ser 2.95  0.50 - 1.10 mg/dL   Calcium 8.7  8.4 - 28.4 mg/dL   GFR calc non Af Amer 52 (*) >90 mL/min   GFR calc Af Amer 60 (*) >90 mL/min  LACTIC ACID, PLASMA      Result Value Range   Lactic Acid, Venous 2.6 (*) 0.5 - 2.2 mmol/L  URINALYSIS, ROUTINE W REFLEX MICROSCOPIC      Result Value Range   Color, Urine YELLOW  YELLOW   APPearance CLEAR  CLEAR   Specific Gravity, Urine 1.015  1.005 - 1.030   pH 6.0  5.0 - 8.0   Glucose, UA NEGATIVE  NEGATIVE mg/dL   Hgb urine dipstick MODERATE (*) NEGATIVE   Bilirubin Urine NEGATIVE  NEGATIVE   Ketones, ur NEGATIVE  NEGATIVE mg/dL   Protein, ur 30 (*) NEGATIVE mg/dL   Urobilinogen,  UA 1.0  0.0 - 1.0 mg/dL   Nitrite NEGATIVE  NEGATIVE   Leukocytes, UA NEGATIVE  NEGATIVE  URINE MICROSCOPIC-ADD ON      Result Value Range   Squamous Epithelial / LPF RARE  RARE   WBC, UA 0-2  <3 WBC/hpf   RBC / HPF 3-6  <3 RBC/hpf   Bacteria, UA RARE  RARE   Casts GRANULAR CAST (*) NEGATIVE   Dg Chest 2 View 10/29/2013   CLINICAL DATA:  Cough, shortness of breath.  EXAM: CHEST  2 VIEW  COMPARISON:  July 27, 2013.  FINDINGS: Stable cardiomediastinal silhouette. Left subclavian Port-A-Cath is again noted with tip unchanged in position. Osteophyte formation is seen involving middle and lower thoracic spine. No pleural effusion or pneumothorax is noted. No acute pulmonary disease is noted.  IMPRESSION: No acute cardiopulmonary abnormality seen.   Electronically Signed   By: Roque Lias M.D.   On: 10/29/2013 16:34    1740:  H/H, platelets per baseline. No clear source for fever; will start IV zosyn and vancomycin. Fever  improved with APAP. Pt continues to mentate per baseline. SBP 100-110's; will dose judicious IVF. Dx and testing d/w pt and family.  Questions answered.  Verb understanding, agreeable to admit. T/C to [facility] Dr. Jerral Ralph, case discussed, including:  HPI, pertinent PM/SHx, VS/PE, dx testing, ED course and treatment:  Agreeable to admit, requests to write temporary orders, obtain tele bed to Dr. Michelle Nasuti service.   Laray Anger, DO 10/30/13 2158

## 2013-10-29 NOTE — H&P (Addendum)
PATIENT DETAILS Name: [name] Age: 67 y.o. Sex: female Date of Birth: 07/03/1946 Admit Date: 10/29/2013 ZOX:[id number] D, MD   CHIEF COMPLAINT:  Fever with chills-1 day  HPI: [name] is a 67 y.o. female with a Past Medical History of stage IV pancreatic cancer with ongoing chemotherapy-last chemotherapy therapy on 11/24, diabetes, hypertension, peripheral neuropathy who presents today with the above noted complaint. Per patient, she was in her usual state of health till last night when she started developing chills and rigors. She felt warm, and then later started sweating profusely. This continued throughout night and through this morning as well. She claims that since the past 3-4 days she's had a "head cold"- which mostly compromises a runny nose, and a dry cough. She also claims to have had a mild headache since the last 3-4 days, however there is no associated photophobia or phonophobia. She claims that she had 2-3 loose stools today. She however denies any vomiting or abdominal pain. She denies any shortness of breath. She also denies any dysuria or hematuria.  She then presented to the ED where initial temperature was 105.30F, urinalysis was negative for UTI, chest x-ray was negative for pneumonia, I was subsequently asked to admit this patient for further evaluation and treatment.  ALLERGIES:  No Known Allergies  PAST MEDICAL HISTORY: Past Medical History  Diagnosis Date  . Diabetes mellitus without complication   . Hypertension   . Anxiety   . Arthritis   . Pancreatic cancer   . 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 Peripheral neuropathy 10/13/2013    PAST SURGICAL HISTORY: Past Surgical History  Procedure Laterality Date  . Abdominal hysterectomy    . Colonoscopy  2006    Dr. Jena Gauss, polyp, benign  . Ercp  05/10/2013    Procedure: ENDOSCOPIC RETROGRADE CHOLANGIOPANCREATOGRAPHY (ERCP)(DIFFICULT CANNULATION) ;  Surgeon: Malissa Hippo, MD;  Location: [facility];  Service: Endoscopy;;  . Biliary stent placement  05/10/2013    Procedure: BILIARY STENT PLACEMENT ( X WALLFLEX STENT);  Surgeon: Malissa Hippo, MD;  Location: [facility];  Service: Endoscopy;;  . Sphincterotomy  05/10/2013    Procedure: SPHINCTEROTOMY (PRECUT MADE WITH NEEDLE KNIFE);  Surgeon: Malissa Hippo, MD;  Location: [facility];  Service: Endoscopy;;  . Liver biopsy  05/12/2013  . Portacath placement Left 05/23/2013    Procedure: INSERTION PORT-A-CATH;  Surgeon: Fabio Bering, MD;  Location: [facility];  Service: General;  Laterality: Left;    MEDICATIONS AT HOME: Prior to Admission medications   Medication Sig Start Date End Date Taking? Authorizing Provider  ALPRAZolam Prudy Feeler) 1 MG tablet Take 1 mg by mouth 3 (three) times daily as needed for sleep or anxiety.   Yes Historical Provider, MD  amLODipine (NORVASC) 5 MG tablet Take 5 mg by mouth daily.   Yes Historical Provider, MD  dexamethasone (DECADRON) 4 MG tablet Take 1 tablet in the am and 1 tablet in the pm x 2 days after chemo. 10/27/13  Yes Maurine Minister Kefalas, PA-C  furosemide (LASIX) 40 MG tablet Take 1 tablet (40 mg total) by mouth daily. 10/27/13  Yes Ellouise Newer, PA-C  gabapentin (NEURONTIN) 300 MG capsule Take 1 tab at HS x 3 days, then 2 tab at HS x 3 days, then 3 tabs at HS thereafter 10/13/13  Yes Maurine Minister Kefalas, PA-C  Gemcitabine HCl (GEMZAR IV) Inject into the vein. Day 1, 8, 15 every 28 days   Yes Historical Provider, MD  HYDROCODONE-ACETAMINOPHEN PO Take 1 tablet by mouth as needed (only takes when knee is hurting - averages 2 x weekly.).   Yes Historical Provider, MD  insulin glargine (LANTUS) 100 UNIT/ML injection Inject 20 Units into the skin at bedtime.    Yes Historical Provider, MD  losartan (COZAAR) 100 MG tablet Take 100 mg by mouth daily.   Yes Historical Provider, MD  omeprazole (PRILOSEC) 20 MG capsule Take 1 capsule (20 mg total) by mouth daily. 06/13/13  Yes Ellouise Newer, PA-C  PACLitaxel Protein-Bound Part (ABRAXANE IV) Inject into the vein. Day 1, 8, 15 every 28 days   Yes Historical Provider, MD  polyethylene glycol powder (GLYCOLAX/MIRALAX) powder Take 17 g by mouth daily.   Yes Historical Provider, MD  potassium chloride SA (K-DUR,KLOR-CON) 20 MEQ tablet Take 1 tablet (20 mEq total) by mouth daily. 10/27/13  Yes Maurine Minister Kefalas, PA-C  senna (SENOKOT) 8.6 MG tablet Take 1 tablet by mouth daily.   Yes Historical Provider, MD  heparin lock flush 100 UNIT/ML SOLN injection 5 mLs (500 Units total) by Intracatheter route daily as needed [facility]). 08/07/13   Isabella Stalling, MD  LORazepam (ATIVAN) 0.5 MG tablet Take 0.5 mg by mouth. May take 1 -2 tabs every 6 hours IF needed for nausea/vomiting. Start with 1 tab and increase to 2 tabs if needed. (Do not take Xanax within 2 hours of taking Ativan).    Historical Provider, MD    FAMILY HISTORY: Family History  Problem Relation Age of Onset  . Colon cancer Neg Hx   . Liver disease Neg Hx   . Bone cancer Brother   . Cancer Brother   . Hypertension Mother     SOCIAL HISTORY:  reports that she has quit smoking. She quit smokeless tobacco use about 7 months ago. She reports that she does not drink alcohol or use illicit drugs.  REVIEW OF SYSTEMS:  Constitutional:   No  weight loss, night sweats,  fatigue.  HEENT:    No headaches, Difficulty swallowing,Tooth/dental problems,Sore throat,  No sneezing, itching, ear ache, nasal congestion, post nasal drip,   Cardio-vascular: No chest pain,  Orthopnea, PND, swelling in lower extremities, anasarca, dizziness, palpitations  GI:  No heartburn, indigestion, abdominal pain, nausea, vomiting,  change in bowel habits, loss of appetite  Resp: No shortness of breath with exertion or at rest.  No excess mucus, no productive cough, No non-productive cough,  No coughing up of blood.No change in color of mucus.No wheezing.No chest wall deformity  Skin:  no  rash or lesions.  GU:  no dysuria, change in color of urine, no urgency or frequency.  No flank pain.  Musculoskeletal: No joint pain or swelling.  No decreased range of motion.  No back pain.  Psych: No change in mood or affect. No depression or anxiety.  No memory loss.   PHYSICAL EXAM: Blood pressure 103/61, pulse 122, temperature 99.1 F (37.3 C), temperature source Oral, resp. rate 19, SpO2 97.00%.  General appearance :Awake, alert, not in any distress. Speech Clear. Not toxic Looking HEENT: Atraumatic and Normocephalic, pupils equally reactive to light and accomodation Neck: supple, no JVD. No cervical lymphadenopathy.  Chest:Good air entry bilaterally, no added sounds  CVS: S1 S2 regular, no murmurs.  Abdomen: Bowel sounds present, Non tender and not distended with no gaurding, rigidity or rebound. Extremities: B/L Lower Ext shows no edema, both legs are warm to touch Neurology: Awake alert, and oriented X 3, CN II-XII  intact, Non focal Skin:No Rash-No evidence of cellulitis.  Wounds:N/A  LABS ON ADMISSION:   Recent Labs  10/29/13 1533  NA 134*  K 3.6  CL 98  CO2 23  GLUCOSE 106*  BUN 21  CREATININE 1.10  CALCIUM 8.7   No results found for this basename: AST, ALT, ALKPHOS, BILITOT, PROT, ALBUMIN,  in the last 72 hours No results found for this basename: LIPASE, AMYLASE,  in the last 72 hours  Recent Labs  10/27/13 1452 10/29/13 1533  WBC 13.5* 8.1  NEUTROABS  --  7.3  HGB 9.9* 9.5*  HCT 31.5* 29.6*  MCV 86.3 85.3  PLT 97* 129*   No results found for this basename: CKTOTAL, CKMB, CKMBINDEX, TROPONINI,  in the last 72 hours No results found for this basename: DDIMER,  in the last 72 hours No components found with this basename: POCBNP,    RADIOLOGIC STUDIES ON ADMISSION: Dg Chest 2 View  10/29/2013   CLINICAL DATA:  Cough, shortness of breath.  EXAM: CHEST  2 VIEW  COMPARISON:  July 27, 2013.  FINDINGS: Stable cardiomediastinal silhouette. Left  subclavian Port-A-Cath is again noted with tip unchanged in position. Osteophyte formation is seen involving middle and lower thoracic spine. No pleural effusion or pneumothorax is noted. No acute pulmonary disease is noted.  IMPRESSION: No acute cardiopulmonary abnormality seen.   Electronically Signed   By: Roque Lias M.D.   On: 10/29/2013 16:34     EKG: Independently reviewed. sinus tachycardia   ASSESSMENT AND PLAN: Present on Admission:  . SIRS (systemic inflammatory response syndrome) - Suspected infectious etiology-however not apparent at present. UA negative for UTI, chest x-ray negative for pneumonia, will need to wait for culture data and treated with IV fluids, empiric IV antibiotics. Although very high fevers, she looks nontoxic and looks very much stable, therefore will admit to telemetry and monitor closely.   . Fever - No foci of infection apparent-apart from mild URI like symptoms and some loose stools earlier today. UA is negative for UTI, chest x-ray negative for pneumonia. Does have a Port-A-Cath in place, therefore will need to make sure blood cultures are negative. Patient is immunocompromised, and is currently getting chemotherapy. - Await blood and urine cultures, in the meantime, start empiric vancomycin and Zosyn. Aggressively hydrate. - Will check influenza PCR, and if diarrhea continues we'll check a stool C. difficile panel. - Will also check a stat LFTs- to make sure no jaundice/obstructive physiology (not apparent clinically)  . Diabetes mellitus  - We'll continue with Lantus 20 units, we'll also place on sliding scale insulin. CBGs will be monitored, insulin regimen will be optimized further depending on CBG results.   Marland Kitchen HTN (hypertension) - Blood pressure soft but stable. Also tachycardic. Therefore will hold all antihypertensive medications for now, resume when patient more stable.   . Malignant neoplasm of pancreas, part unspecified - Has a history of stage  IV pancreatic cancer, getting chemotherapy almost on a weekly basis at the cancer center. Last chemotherapy on 11/24.  - Oncology consultation will be obtained if needed.   Further plan will depend as patient's clinical course evolves and further radiologic and laboratory data become available. Patient will be monitored closely.  Above plan was discussed with patient, and granddaughter at bedside, both were in agreement.   DVT Prophylaxis: Prophylactic Lovenox   Code Status: Full Code  Total time spent for admission equals 45 minutes.  [facility] Pager [phone]  If 7PM-7AM, please contact  night-coverage [email or web address] Password [facility] 10/29/2013, 6:08 PM

## 2013-10-29 NOTE — Progress Notes (Signed)
ANTIBIOTIC CONSULT NOTE - INITIAL  Pharmacy Consult for Vancomycin & Zosyn Indication: febrile illness  No Known Allergies  Patient Measurements: Height: 5' 6.14" (168 cm) Weight: 154 lb 15.7 oz (70.3 kg) IBW/kg (Calculated) : 59.63  Vital Signs: Temp: 99.1 F (37.3 C) (12/03 1700) Temp src: Oral (12/03 1700) BP: 103/61 mmHg (12/03 1700) Pulse Rate: 124 (12/03 1807) Intake/Output from previous day:   Intake/Output from this shift: Total I/O In: -  Out: 30 [Urine:30]  Labs:  Recent Labs  10/27/13 1452 10/29/13 1533  WBC 13.5* 8.1  HGB 9.9* 9.5*  PLT 97* 129*  CREATININE  --  1.10   Estimated Creatinine Clearance: 48 ml/min (by C-G formula based on Cr of 1.1). No results found for this basename: VANCOTROUGH, VANCOPEAK, VANCORANDOM, GENTTROUGH, GENTPEAK, GENTRANDOM, TOBRATROUGH, TOBRAPEAK, TOBRARND, AMIKACINPEAK, AMIKACINTROU, AMIKACIN,  in the last 72 hours   Microbiology: No results found for this or any previous visit (from the past 720 hour(s)).  Medical History: Past Medical History  Diagnosis Date  . Diabetes mellitus without complication   . Hypertension   . Anxiety   . Arthritis   . Pancreatic cancer   . 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 Peripheral neuropathy 10/13/2013    Medications:  Scheduled:    Assessment: 67 yo F starting on empiric, broad-spectrum antibiotics for fever of unknown origin.  She is currently receiving chemotherapy for pancreatic CA.  WBC is normal.  Renal function is essentially at patient's current baseline.    Vancomycin 12/3>> Zosyn 12/3>>  Goal of Therapy:  Vancomycin trough level 15-20 mcg/ml  Plan:  Zosyn 3.375gm IV Q8h to be infused over 4hrs Vancomycin 1gm IV x1 now then 500mg  IV q12h Check Vancomycin trough at steady state Monitor renal function and cx data   Elson Clan 10/29/2013,6:11 PM

## 2013-10-30 LAB — CBC
HCT: 25.9 % — ABNORMAL LOW (ref 36.0–46.0)
Hemoglobin: 8.3 g/dL — ABNORMAL LOW (ref 12.0–15.0)
MCHC: 32 g/dL (ref 30.0–36.0)
RBC: 3.02 MIL/uL — ABNORMAL LOW (ref 3.87–5.11)
WBC: 23.8 10*3/uL — ABNORMAL HIGH (ref 4.0–10.5)

## 2013-10-30 LAB — COMPREHENSIVE METABOLIC PANEL
ALT: 17 U/L (ref 0–35)
AST: 21 U/L (ref 0–37)
Alkaline Phosphatase: 163 U/L — ABNORMAL HIGH (ref 39–117)
CO2: 26 mEq/L (ref 19–32)
GFR calc Af Amer: 47 mL/min — ABNORMAL LOW (ref >90)
Glucose, Bld: 331 mg/dL — ABNORMAL HIGH (ref 70–99)
Potassium: 4.5 mEq/L (ref 3.5–5.1)
Sodium: 136 mEq/L (ref 135–145)
Total Bilirubin: 0.4 mg/dL (ref 0.3–1.2)
Total Protein: 6.1 g/dL (ref 6.0–8.3)

## 2013-10-30 LAB — GLUCOSE, CAPILLARY: Glucose-Capillary: 132 mg/dL — ABNORMAL HIGH (ref 70–99)

## 2013-10-30 LAB — INFLUENZA PANEL BY PCR (TYPE A & B): Influenza A By PCR: NEGATIVE

## 2013-10-30 MED ORDER — VANCOMYCIN HCL 500 MG IV SOLR
500.0000 mg | Freq: Two times a day (BID) | INTRAVENOUS | Status: DC
  Administered 2013-10-30 – 2013-11-01 (×4): 500 mg via INTRAVENOUS
  Filled 2013-10-30 (×8): qty 500

## 2013-10-30 MED ORDER — VANCOMYCIN HCL IN DEXTROSE 1-5 GM/200ML-% IV SOLN
1000.0000 mg | Freq: Once | INTRAVENOUS | Status: AC
  Administered 2013-10-30: 1000 mg via INTRAVENOUS
  Filled 2013-10-30: qty 200

## 2013-10-30 NOTE — Progress Notes (Signed)
Inpatient Diabetes Program Recommendations  AACE/ADA: New Consensus Statement on Inpatient Glycemic Control (2013)  Target Ranges:  Prepandial:   less than 140 mg/dL      Peak postprandial:   less than 180 mg/dL (1-2 hours)      Critically ill patients:  140 - 180 mg/dL  Results for BRANDACE, CARGLE (MRN [id number]) as of 10/30/2013 08:17  Ref. Range 05/09/2013 04:52  Hemoglobin A1C Latest Range: <5.7 % 11.3 (H)   Results for LAURELIN, ELSON (MRN [id number]) as of 10/30/2013 08:17  Ref. Range 10/29/2013 20:29 10/29/2013 21:45 10/30/2013 07:51  Glucose-Capillary Latest Range: 70-99 mg/dL 65 (L) 782 (H) 956 (H)    Inpatient Diabetes Program Recommendations Insulin - Basal: Please consider decreasing Lantus to 15 units and change frequency to daily (since the patient did not receive any Lantus last night). A1C: Please consider ordering an A1C to determine glycemic control over the past 2-3 months.  Note: Patient has a history of diabetes and takes Lantus 20 units QHS as an outpatient for diabetes management.  Currently, patient is ordered to receive Lantus 20 units QHS and Novolog 0-9 units AC for inpatient glycemic control.  Initial lab glucose noted to be 106 mg/dl on 21/3/08 @ 65:78.  Noted hypoglycemia with blood glucose of 65 mg/dl at 46:96. Please consider decreasing Lantus to 15 units and change frequency to daily (so patient can receive a dose this morning since she did not receive any basal insulin yesterday).  Also, please consider ordering an A1C.  Will continue to follow.  Thanks, Orlando Penner, RN, MSN, CCRN Diabetes Coordinator Inpatient Diabetes Program [phone] (Team Pager) [phone] ([facility] office) [phone] [facility] office)

## 2013-10-30 NOTE — Progress Notes (Signed)
Brief initial visit.

## 2013-10-30 NOTE — Progress Notes (Signed)
Utilization Review Complete  

## 2013-10-31 LAB — GI PATHOGEN PANEL BY PCR, STOOL
Cryptosporidium by PCR: NEGATIVE
E coli (ETEC) LT/ST: NEGATIVE
G lamblia by PCR: NEGATIVE
Norovirus GI/GII: NEGATIVE
Rotavirus A by PCR: NEGATIVE
Salmonella by PCR: NEGATIVE
Shigella by PCR: POSITIVE

## 2013-10-31 LAB — HEMOGLOBIN A1C
Hgb A1c MFr Bld: 8.4 % — ABNORMAL HIGH (ref <5.7)
Mean Plasma Glucose: 194 mg/dL — ABNORMAL HIGH (ref <117)

## 2013-10-31 LAB — GLUCOSE, CAPILLARY
Glucose-Capillary: 191 mg/dL — ABNORMAL HIGH (ref 70–99)
Glucose-Capillary: 217 mg/dL — ABNORMAL HIGH (ref 70–99)
Glucose-Capillary: 204 mg/dL — ABNORMAL HIGH (ref 70–99)
Glucose-Capillary: 126 mg/dL — ABNORMAL HIGH (ref 70–99)

## 2013-10-31 LAB — BASIC METABOLIC PANEL
BUN: 28 mg/dL — ABNORMAL HIGH (ref 6–23)
CO2: 23 mEq/L (ref 19–32)
GFR calc Af Amer: 43 mL/min — ABNORMAL LOW (ref >90)
GFR calc non Af Amer: 38 mL/min — ABNORMAL LOW (ref >90)
Glucose, Bld: 347 mg/dL — ABNORMAL HIGH (ref 70–99)
Potassium: 3.4 mEq/L — ABNORMAL LOW (ref 3.5–5.1)
Sodium: 134 mEq/L — ABNORMAL LOW (ref 135–145)

## 2013-10-31 MED ORDER — INSULIN GLARGINE 100 UNIT/ML ~~LOC~~ SOLN
10.0000 [IU] | Freq: Every day | SUBCUTANEOUS | Status: DC
  Administered 2013-10-31: 10 [IU] via SUBCUTANEOUS
  Filled 2013-10-31 (×3): qty 0.1

## 2013-10-31 MED ORDER — DIPHENOXYLATE-ATROPINE 2.5-0.025 MG PO TABS
1.0000 | ORAL_TABLET | Freq: Four times a day (QID) | ORAL | Status: DC | PRN
  Administered 2013-10-31: 1 via ORAL
  Filled 2013-10-31: qty 1

## 2013-10-31 NOTE — Progress Notes (Signed)
NAMEVINCENZA, [name]                ACCOUNT NO.:  [id number]  MEDICAL RECORD NO.:  [id number]  LOCATION:  A337                          FACILITY:  [facility]  PHYSICIAN:  Carly Sabo G. Renard Matter, MD   DATE OF BIRTH:  July 30, 1946  DATE OF PROCEDURE: DATE OF DISCHARGE:                                PROGRESS NOTE   This patient is feeling some better today.  She does have a history of stage IV pancreatic cancer with ongoing chemotherapy, which she gets approximately once a week; diabetes; hypertension; peripheral neuropathy.  She apparently developed upper respiratory symptoms along with several loose stools, and had a temperature of 105.9, and slightly elevated white blood count 13.5 with hemoglobin 9.9.  Cultures of blood and urine have been obtained.  She was placed on IV vancomycin and Zosyn.  OBJECTIVE:  VITAL SIGNS:  Blood pressure 95/54, respirations 20, pulse 91, temperature 97.6. HEENT:  Negative. NECK:  Supple.  No JVD or thyroid abnormalities. HEART:  Regular rhythm.  No murmurs. LUNGS:  Clear to P and A. ABDOMEN:  No palpable organs or masses.  No organomegaly. EXTREMITIES:  Free of edema.  ASSESSMENT:  The patient does have a history of pancreatic cancer stage IV, and is receiving chemotherapy at [facility] once a week.  She does have diabetes mellitus.  Continue 20 units of Lantus insulin and sliding scale.  Hypertension and fever of unknown origin without respiratory symptoms.  The patient is being treated with vancomycin and Zosyn and being hydrated.  Blood and urine cultures are pending. Continue this regimen.     [name] G. Renard Matter, MD     AGM/MEDQ  D:  10/30/2013  T:  10/30/2013  Job:  [id number]

## 2013-10-31 NOTE — Progress Notes (Signed)
ANTIBIOTIC CONSULT NOTE  Pharmacy Consult for Vancomycin & Zosyn Indication: febrile illness  No Known Allergies  Patient Measurements: Height: 5' 6.14" (168 cm) Weight: 154 lb 15.7 oz (70.3 kg) IBW/kg (Calculated) : 59.63  Vital Signs: Temp: 97.5 F (36.4 C) (12/05 0440) Temp src: Oral (12/05 0440) BP: 105/65 mmHg (12/05 0440) Pulse Rate: 78 (12/05 0440) Intake/Output from previous day: 12/04 0701 - 12/05 0700 In: 2862.5 [P.O.:480; I.V.:2332.5; IV Piggyback:50] Out: -  Intake/Output from this shift: Total I/O In: 2860 [P.O.:240; I.V.:2620] Out: -   Labs:  Recent Labs  10/29/13 1533 10/30/13 0532 10/31/13 0450  WBC 8.1 23.8*  --   HGB 9.5* 8.3*  --   PLT 129* 157  --   CREATININE 1.10 1.33* 1.43*   Estimated Creatinine Clearance: 36.9 ml/min (by C-G formula based on Cr of 1.43). No results found for this basename: VANCOTROUGH, Leodis Binet, VANCORANDOM, GENTTROUGH, GENTPEAK, GENTRANDOM, TOBRATROUGH, TOBRAPEAK, TOBRARND, AMIKACINPEAK, AMIKACINTROU, AMIKACIN,  in the last 72 hours   Microbiology: Recent Results (from the past 720 hour(s))  CULTURE, BLOOD (ROUTINE X 2)     Status: None   Collection Time    10/29/13  3:33 PM      Result Value Range Status   Specimen Description BLOOD PORTA CATH DRAWN BY RN GM   Final   Special Requests BOTTLES DRAWN AEROBIC AND ANAEROBIC [facility]   Final   Culture  Setup Time     Final   Value: 10/30/2013 16:31     Performed at [facility]   Culture     Final   Value: ESCHERICHIA COLI     GRAM POSITIVE COCCI IN CHAINS     4 Note: Gram Stain Report Called to,Read Back By and Verified With: Edgar Frisk RN 12 14 850P EDMOJ Gram Stain Report Called to,Read Back By and Verified With: Edgar Frisk RN 12 4 14  900P EDMOJ     Performed at [facility]   Report Status PENDING   Incomplete  CULTURE, BLOOD (ROUTINE X 2)     Status: None   Collection Time    10/29/13  3:45 PM      Result Value Range Status   Specimen  Description BLOOD RIGHT ANTECUBITAL   Final   Special Requests BOTTLES DRAWN AEROBIC AND ANAEROBIC 16CC   Final   Culture  Setup Time     Final   Value: 10/30/2013 13:41     Performed at [facility]   Culture     Final   Value: ESCHERICHIA COLI     Note: Performed at [facility] Gram Stain Report Called to,Read Back By and Verified With: VALDISE DILDY RN ON 10/30/13 @0815  BY RESSEGGER R     Performed at [facility]   Report Status PENDING   Incomplete    Medical History: Past Medical History  Diagnosis Date  . Diabetes mellitus without complication   . Hypertension   . Anxiety   . Arthritis   . Pancreatic cancer   . 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 Peripheral neuropathy 10/13/2013    Medications:  Scheduled:  . enoxaparin (LOVENOX) injection  40 mg Subcutaneous Q24H  . insulin aspart  0-9 Units Subcutaneous TID WC  . insulin glargine  20 Units Subcutaneous QHS  . pantoprazole  40 mg Oral Daily  . piperacillin-tazobactam (ZOSYN)  IV  3.375 g Intravenous Q8H  . sodium chloride  500 mL Intravenous Once  . vancomycin  500 mg Intravenous  Q12H    Assessment: 67 yo F starting on empiric, broad-spectrum antibiotics for fever of unknown origin.  She is currently receiving chemotherapy for pancreatic CA.   WBC & lactic acid elevated.  Renal function is worsening on combination of Vanc, Zosyn. 2/2 Blood cultures growing E.coli.    Vancomycin 12/3>> Zosyn 12/3>>  Goal of Therapy:  Vancomycin trough level 15-20 mcg/ml  Plan:  Zosyn 3.375gm IV Q8h to be infused over 4hrs Vancomycin 500mg  IV q12h Check Vancomycin trough at steady state if warranted Monitor renal function and cx data  Duration of therapy per MD- Consider d/c Vancomycin   Camara Rosander, Mercy Riding 10/31/2013,11:07 AM

## 2013-10-31 NOTE — Progress Notes (Addendum)
Inpatient Diabetes Program Recommendations  AACE/ADA: New Consensus Statement on Inpatient Glycemic Control (2013)  Target Ranges:  Prepandial:   less than 140 mg/dL      Peak postprandial:   less than 180 mg/dL (1-2 hours)      Critically ill patients:  140 - 180 mg/dL   Results for [name], [name] (MRN [id number]) as of 10/31/2013 12:48  Ref. Range 10/30/2013 11:57 10/30/2013 17:00 10/30/2013 21:49 10/31/2013 07:28 10/31/2013 11:33  Glucose-Capillary Latest Range: 70-99 mg/dL 782 (H) 956 (H) 213 (H) 308 (H) 217 (H)    Inpatient Diabetes Program Recommendations Insulin - Basal: Please consider decreasing Lantus to 10 units QHS.  Patient has not recevied any basal insulin since being admitted as the Lantus 20 units QHS has not been given as it was charted as "Patient/family refused".  Note: Currently patient is ordered to receive Lantus 20 units QHS and Novolog 0-9 units AC for inpatient glycemic control.  In reviewing the chart, noted that patient has not received any Lantus since being admitted as the Lantus has been charted as "Not Given, Patient/Family refused".  Without any basal insulin, blood glucose ranged from 141-356 mg over the past 30 hours.  Please consider decreasing Lantus to 10 units QHS.  Will continue to follow.  10/31/13@13 :04 - Talked with patient about diabetes and insulin regimen for outpatient and inpatient glycemic control.  Patient reports that she takes Lantus 20 units QHS and when she is taking Decadron she takes Lantus 40 units.  Patient reports that she does not have any low blood sugars and her blood sugar "runs pretty good all the time".  Inquired about taking Lantus as an inpatient and she states that the nurses did not give it to her on 12/3 at bedtime because her blood glucose was low (65 mg/dl on 08/6 at 57:84).  She states that she was not asked to take any Lantus last night.  However, it was charted as NOT GIVEN, Patient/Family Refused by Harrie Jeans, RN on 12/4  @ 21:18.  Paged Dr. Renard Matter (covering for Dr. [name])  to inform him that patient has not received any Lantus and to request Lantus be decreased as requested.   Thanks, Orlando Penner, RN, MSN, CCRN Diabetes Coordinator Inpatient Diabetes Program [phone] (Team Pager) [phone] ([facility] office) [phone] [facility] office)

## 2013-10-31 NOTE — Progress Notes (Signed)
[name], PRO                ACCOUNT NO.:  [id number]  MEDICAL RECORD NO.:  [id number]  LOCATION:  A337                          FACILITY:  [facility]  PHYSICIAN:  Narmeen Kerper G. Renard Matter, MD   DATE OF BIRTH:  [birth year]  DATE OF PROCEDURE: DATE OF DISCHARGE:                                PROGRESS NOTE   This patient continues to feel better.  She did have fever yesterday. She does have a history of stage IV pancreatic cancer with ongoing chemotherapy, which she gets once a week.  Diabetes, hypertension, peripheral neuropathy, and upper respiratory infection with several loose stools.  She remains on IV vancomycin and Zosyn.  OBJECTIVE:  VITAL SIGNS:  Blood pressure 105/65, respirations 20, pulse 78, temp 97.5. HEENT:  Negative. NECK:  Supple.  No JVD or thyroid abnormalities. HEART:  Regular rhythm.  No murmurs. LUNGS:  Clear to P and A. ABDOMEN:  No palpable organs or masses.  No organomegaly. EXTREMITIES:  Free of edema.  ASSESSMENT:  The patient does have a history of pancreatic cancer, stage IV, and receiving chemotherapy at [facility] once a week.  She does have diabetes mellitus, hypertension, developed upper respiratory symptoms and fever, is being treated with vancomycin and Zosyn and being hydrated.  Blood and urine cultures are pending.  Continue current IV antibiotics.     Maite Burlison G. Renard Matter, MD     AGM/MEDQ  D:  10/31/2013  T:  10/31/2013  Job:  [id number]

## 2013-11-01 LAB — CULTURE, BLOOD (ROUTINE X 2)

## 2013-11-01 LAB — GLUCOSE, CAPILLARY: Glucose-Capillary: 222 mg/dL — ABNORMAL HIGH (ref 70–99)

## 2013-11-01 MED ORDER — HEPARIN SOD (PORK) LOCK FLUSH 100 UNIT/ML IV SOLN
500.0000 [IU] | INTRAVENOUS | Status: AC | PRN
  Administered 2013-11-01: 500 [IU]
  Filled 2013-11-01: qty 5

## 2013-11-01 NOTE — Progress Notes (Signed)
Pt verbalizes understanding of d/c instructions, medications, and follow up appts. Pt has no questions at this time. Pt has prescription for cipro in her possession, as well as d/c instructions. pts granddaughters are at bedside and will be escorting her home. Pts port was deaccessed w/o complication. Pt d/c via wheelchair accompanied by NT and her family members. Sheryn Bison

## 2013-11-02 NOTE — Discharge Summary (Signed)
Autumn, Bonilla                ACCOUNT NO.:  [id number]  MEDICAL RECORD NO.:  [id number]  LOCATION:  A337                          FACILITY:  [facility]  PHYSICIAN:  Sharonica Kraszewski G. Renard Matter, MD   DATE OF BIRTH:  1946/10/13  DATE OF ADMISSION:  10/29/2013 DATE OF DISCHARGE:  12/06/2014LH                              DISCHARGE SUMMARY   This 67 year old female patient, admitted October 29, 2013 discharge November 01, 2013, 3 days hospitalization.  DIAGNOSES: 1. Stage IV pancreatic cancer. 2. Essential hypertension. 3. Diabetes mellitus type 2. 4. Upper respiratory infection.  Blood cultures positive for E-coli.     This patient developed chills and fever prior to admission,     apparently had head cold-type symptoms prior to this with runny     nose, dry cough, and mild headache.  She is on a home chemotherapy,     the last date of chemotherapy November, 24, 2014.  She has noted in     the emergency department to have a temperature of 105.9.  Chest x-     ray was negative for pneumonia.  Urinary tract infection was ruled     out with negative urinalysis.  The patient was admitted for further     care.  PHYSICAL EXAMINATION:  VITAL SIGNS:  On admission, blood pressure 143/61, pulse 122, temp 99.1. HEENT:  Eyes, PERRLA, TMs negative.  Oropharynx benign. NECK:  Supple.  No JVD or thyroid abnormalities. LUNGS:  Clear to P and A. ABDOMEN:  No palpable organs or masses.  No organomegaly is noted.  LABORATORY DATA:  On admission CBC:  WBC 13,500 with hemoglobin 9.9, and hematocrit 31.5.  Subsequent CBC, WBC 8.1, hemoglobin 9.5, hematocrit 29.6.  Chemistries on admission, sodium 134, potassium 3.6, chloride 98, CO2 23, glucose 106, BUN 21, creatinine 1.1.  Calcium 8.7.  Subsequently had blood culture positive E-coli and enterococcus species.  Lactic acid 2.6.  RADIOLOGY:  Chest x-ray, no acute cardiopulmonary abnormalities seen.  HOSPITAL COURSE:  The patient on admission was placed on  intravenous fluids, 0.9% normal saline infusion.  She was continued on Protonix 40 mg daily, glargine insulin 10 units daily, NovoLog by sliding scale. She also had subcutaneous Lovenox given 40 mg q.24 hours for DVT prophylaxis.  She was placed on vancomycin 500 mg every 12 hours and Zosyn 3.375 g every 8 hours.  She remained on this regimen through into the hospital.  The patient had p.r.n. medications with Zofran, Xanax 1 mg, oxycodone 5 mg every 4 hours p.r.n. for pain.  The patient was febrile for the first 24-48 hours, then became subsequently afebrile and relatively asymptomatic.  She did have blood cultures which turned out to be positive for E-coli.  She remained on IV antibiotic regimen through the entire hospital stay.  It was felt she could be discharged after previous hospitalization to [facility] up [facility].     [name] G. Renard Matter, MD     AGM/MEDQ  D:  11/01/2013  T:  11/02/2013  Job:  [id number]

## 2013-11-02 NOTE — Discharge Summary (Signed)
[name], [name]                ACCOUNT NO.:  [id number]  MEDICAL RECORD NO.:  [id number]  LOCATION:  A337                          FACILITY:  [facility]  PHYSICIAN:  Marilee Ditommaso G. Renard Matter, MD   DATE OF BIRTH:  August 09, 1946  DATE OF ADMISSION:  10/29/2013 DATE OF DISCHARGE:  12/06/2014LH                              DISCHARGE SUMMARY   ADDENDUM:  The patient will also be prescribed Cipro 500 mg b.i.d. Prescription will be given to her for this.     Koby Hartfield G. Renard Matter, MD     AGM/MEDQ  D:  11/01/2013  T:  11/02/2013  Job:  [id number]

## 2013-11-02 NOTE — Discharge Summary (Signed)
[name], [name]                ACCOUNT NO.:  [id number]  MEDICAL RECORD NO.:  [id number]  LOCATION:  A337                          FACILITY:  [facility]  PHYSICIAN:  Layah Skousen G. Renard Matter, MD   DATE OF BIRTH:  1946/11/15  DATE OF ADMISSION:  10/29/2013 DATE OF DISCHARGE:  12/06/2014LH                              DISCHARGE SUMMARY   ADDENDUM:  The patient will be taking the following medications at home: 1. Xanax 1 mg t.i.d. as needed for sleep or anxiety. 2. Cozaar 100 mg daily. 3. Norvasc 5 mg daily. 4. Ativan 0.5 mg 1-2 tablets every 6 hours as needed for agitation,     nausea, and vomiting. 5. Lantus insulin 20 units daily at bedtime. 6. Prilosec 20 mg daily. 7. Polyethylene glycol powder 17 g daily. 8. Abraxane IV inject on day 1, 8, 15 and every 28 days. 9. Gemzar IV inject on 1, 8, 15, and every 28 days. 10.Heparin flush 5 mL into intracatheter route daily as needed. 11.Hydrocodone and acetaminophen 1 tablet as needed for pain in knee. 12.Senokot 8.6 mg daily. 13.Neurontin 300 mg at bedtime. 14.Decadron 4 mg, one in the p.m. x2 days after chemotherapy. 15.Lasix 40 mg daily. 16.Potassium chloride 20 mEq daily.  The patient is to continue with     chemotherapy.     Rider Ermis G. Renard Matter, MD     AGM/MEDQ  D:  11/01/2013  T:  11/02/2013  Job:  [id number]

## 2013-11-03 ENCOUNTER — Ambulatory Visit (HOSPITAL_COMMUNITY)
Admission: RE | Admit: 2013-11-03 | Discharge: 2013-11-03 | Disposition: A | Discharge status: Home / Self Care | Payer: Medicare Other | Source: Ambulatory Visit | Attending: Oncology | Admitting: Oncology

## 2013-11-03 ENCOUNTER — Ambulatory Visit (HOSPITAL_COMMUNITY): Payer: Medicare Other

## 2013-11-03 ENCOUNTER — Other Ambulatory Visit (HOSPITAL_COMMUNITY): Payer: Self-pay | Admitting: Oncology

## 2013-11-03 DIAGNOSIS — K750 Abscess of liver: Secondary | ICD-10-CM

## 2013-11-03 DIAGNOSIS — C259 Malignant neoplasm of pancreas, unspecified: Secondary | ICD-10-CM

## 2013-11-03 DIAGNOSIS — R1032 Left lower quadrant pain: Secondary | ICD-10-CM | POA: Insufficient documentation

## 2013-11-03 DIAGNOSIS — R188 Other ascites: Secondary | ICD-10-CM | POA: Insufficient documentation

## 2013-11-03 DIAGNOSIS — C787 Secondary malignant neoplasm of liver and intrahepatic bile duct: Secondary | ICD-10-CM | POA: Insufficient documentation

## 2013-11-03 MED ORDER — METRONIDAZOLE 500 MG PO TABS: 500.0000 mg | ORAL_TABLET | Freq: Three times a day (TID) | ORAL | Status: DC

## 2013-11-03 MED ORDER — IOHEXOL 300 MG/ML  SOLN
80.0000 mL | Freq: Once | INTRAMUSCULAR | Status: AC | PRN
  Administered 2013-11-03: 80 mL via INTRAVENOUS

## 2013-11-03 NOTE — Progress Notes (Signed)
I personally reviewed and went over radiographic studies with the patient.   Patient have an intrahepatic abscess.  I have discussed the case with Dr. Karilyn Cota (GI) and he agrees that the [facility] requires percutaneous drainage of abscess by IR.  I have placed the order for this.  Additionally, we will need to alter antibiotic regimen for improved coverage. She is to continue Cipro as prescribed, but I will add Rocephin 2 g IM and Flagyl 500 mg TID.  She will start tomorrow 11/04/2013.  [name]

## 2013-11-04 ENCOUNTER — Encounter (HOSPITAL_COMMUNITY): Payer: Self-pay | Admitting: Oncology

## 2013-11-04 ENCOUNTER — Encounter (HOSPITAL_BASED_OUTPATIENT_CLINIC_OR_DEPARTMENT_OTHER): Payer: Medicare Other | Admitting: Oncology

## 2013-11-04 ENCOUNTER — Encounter (HOSPITAL_COMMUNITY): Payer: Self-pay | Admitting: Radiology

## 2013-11-04 ENCOUNTER — Encounter (HOSPITAL_BASED_OUTPATIENT_CLINIC_OR_DEPARTMENT_OTHER): Payer: Medicare Other

## 2013-11-04 ENCOUNTER — Inpatient Hospital Stay (HOSPITAL_COMMUNITY)
Admission: AD | Admit: 2013-11-04 | Discharge: 2013-11-13 | DRG: 442 | Disposition: A | Discharge status: Home Health | Payer: Medicare Other | Source: Ambulatory Visit | Attending: Internal Medicine | Admitting: Internal Medicine

## 2013-11-04 VITALS — BP 145/92 | HR 107 | Temp 99.3°F | Resp 18

## 2013-11-04 DIAGNOSIS — B9620 Unspecified Escherichia coli [E. coli] as the cause of diseases classified elsewhere: Secondary | ICD-10-CM

## 2013-11-04 DIAGNOSIS — F411 Generalized anxiety disorder: Secondary | ICD-10-CM | POA: Diagnosis present

## 2013-11-04 DIAGNOSIS — G629 Polyneuropathy, unspecified: Secondary | ICD-10-CM

## 2013-11-04 DIAGNOSIS — Z833 Family history of diabetes mellitus: Secondary | ICD-10-CM

## 2013-11-04 DIAGNOSIS — C7800 Secondary malignant neoplasm of unspecified lung: Secondary | ICD-10-CM | POA: Diagnosis present

## 2013-11-04 DIAGNOSIS — E1169 Type 2 diabetes mellitus with other specified complication: Secondary | ICD-10-CM | POA: Diagnosis present

## 2013-11-04 DIAGNOSIS — Z794 Long term (current) use of insulin: Secondary | ICD-10-CM

## 2013-11-04 DIAGNOSIS — C259 Malignant neoplasm of pancreas, unspecified: Secondary | ICD-10-CM

## 2013-11-04 DIAGNOSIS — A498 Other bacterial infections of unspecified site: Secondary | ICD-10-CM | POA: Diagnosis present

## 2013-11-04 DIAGNOSIS — Z808 Family history of malignant neoplasm of other organs or systems: Secondary | ICD-10-CM

## 2013-11-04 DIAGNOSIS — C252 Malignant neoplasm of tail of pancreas: Secondary | ICD-10-CM

## 2013-11-04 DIAGNOSIS — E876 Hypokalemia: Secondary | ICD-10-CM | POA: Diagnosis present

## 2013-11-04 DIAGNOSIS — K750 Abscess of liver: Secondary | ICD-10-CM

## 2013-11-04 DIAGNOSIS — D6481 Anemia due to antineoplastic chemotherapy: Secondary | ICD-10-CM

## 2013-11-04 DIAGNOSIS — Z720 Tobacco use: Secondary | ICD-10-CM

## 2013-11-04 DIAGNOSIS — G609 Hereditary and idiopathic neuropathy, unspecified: Secondary | ICD-10-CM | POA: Diagnosis present

## 2013-11-04 DIAGNOSIS — I10 Essential (primary) hypertension: Secondary | ICD-10-CM

## 2013-11-04 DIAGNOSIS — R509 Fever, unspecified: Secondary | ICD-10-CM

## 2013-11-04 DIAGNOSIS — Z01818 Encounter for other preprocedural examination: Secondary | ICD-10-CM

## 2013-11-04 DIAGNOSIS — B952 Enterococcus as the cause of diseases classified elsewhere: Secondary | ICD-10-CM | POA: Diagnosis present

## 2013-11-04 DIAGNOSIS — K831 Obstruction of bile duct: Secondary | ICD-10-CM

## 2013-11-04 DIAGNOSIS — E119 Type 2 diabetes mellitus without complications: Secondary | ICD-10-CM

## 2013-11-04 DIAGNOSIS — R7881 Bacteremia: Secondary | ICD-10-CM

## 2013-11-04 DIAGNOSIS — C787 Secondary malignant neoplasm of liver and intrahepatic bile duct: Secondary | ICD-10-CM | POA: Diagnosis present

## 2013-11-04 DIAGNOSIS — Z79899 Other long term (current) drug therapy: Secondary | ICD-10-CM

## 2013-11-04 DIAGNOSIS — Z87891 Personal history of nicotine dependence: Secondary | ICD-10-CM

## 2013-11-04 DIAGNOSIS — Z8249 Family history of ischemic heart disease and other diseases of the circulatory system: Secondary | ICD-10-CM

## 2013-11-04 DIAGNOSIS — G4700 Insomnia, unspecified: Secondary | ICD-10-CM

## 2013-11-04 DIAGNOSIS — R6510 Systemic inflammatory response syndrome (SIRS) of non-infectious origin without acute organ dysfunction: Secondary | ICD-10-CM

## 2013-11-04 HISTORY — DX: Abscess of liver: K75.0

## 2013-11-04 LAB — GLUCOSE, CAPILLARY
Glucose-Capillary: 227 mg/dL — ABNORMAL HIGH (ref 70–99)
Glucose-Capillary: 271 mg/dL — ABNORMAL HIGH (ref 70–99)

## 2013-11-04 LAB — APTT: aPTT: 38 seconds — ABNORMAL HIGH (ref 24–37)

## 2013-11-04 LAB — COMPREHENSIVE METABOLIC PANEL
ALT: 8 U/L (ref 0–35)
ALT: 10 U/L (ref 0–35)
AST: 11 U/L (ref 0–37)
AST: 12 U/L (ref 0–37)
Albumin: 2 g/dL — ABNORMAL LOW (ref 3.5–5.2)
Alkaline Phosphatase: 129 U/L — ABNORMAL HIGH (ref 39–117)
Alkaline Phosphatase: 123 U/L — ABNORMAL HIGH (ref 39–117)
BUN: 10 mg/dL (ref 6–23)
CO2: 24 mEq/L (ref 19–32)
Calcium: 8.3 mg/dL — ABNORMAL LOW (ref 8.4–10.5)
Chloride: 100 mEq/L (ref 96–112)
Chloride: 101 mEq/L (ref 96–112)
Creatinine, Ser: 1.18 mg/dL — ABNORMAL HIGH (ref 0.50–1.10)
GFR calc Af Amer: 62 mL/min — ABNORMAL LOW (ref >90)
GFR calc non Af Amer: 47 mL/min — ABNORMAL LOW (ref >90)
Glucose, Bld: 260 mg/dL — ABNORMAL HIGH (ref 70–99)
Potassium: 3.3 mEq/L — ABNORMAL LOW (ref 3.5–5.1)
Potassium: 3.5 mEq/L (ref 3.5–5.1)
Sodium: 135 mEq/L (ref 135–145)
Sodium: 135 mEq/L (ref 135–145)
Total Bilirubin: 0.3 mg/dL (ref 0.3–1.2)
Total Bilirubin: 0.3 mg/dL (ref 0.3–1.2)
Total Protein: 7.3 g/dL (ref 6.0–8.3)

## 2013-11-04 LAB — CBC WITH DIFFERENTIAL/PLATELET
Basophils Absolute: 0 10*3/uL (ref 0.0–0.1)
Basophils Absolute: 0 10*3/uL (ref 0.0–0.1)
Basophils Relative: 0 % (ref 0–1)
Basophils Relative: 0 % (ref 0–1)
Eosinophils Absolute: 0.2 10*3/uL (ref 0.0–0.7)
Eosinophils Relative: 1 % (ref 0–5)
Eosinophils Relative: 1 % (ref 0–5)
HCT: 22.9 % — ABNORMAL LOW (ref 36.0–46.0)
HCT: 25.8 % — ABNORMAL LOW (ref 36.0–46.0)
Hemoglobin: 7.3 g/dL — ABNORMAL LOW (ref 12.0–15.0)
Hemoglobin: 8.1 g/dL — ABNORMAL LOW (ref 12.0–15.0)
Lymphocytes Relative: 13 % (ref 12–46)
Lymphocytes Relative: 12 % (ref 12–46)
Lymphs Abs: 2.4 10*3/uL (ref 0.7–4.0)
MCH: 26 pg (ref 26.0–34.0)
MCHC: 31.4 g/dL (ref 30.0–36.0)
MCV: 83.6 fL (ref 78.0–100.0)
MCV: 83 fL (ref 78.0–100.0)
Monocytes Absolute: 3.3 10*3/uL — ABNORMAL HIGH (ref 0.1–1.0)
Monocytes Relative: 14 % — ABNORMAL HIGH (ref 3–12)
Monocytes Relative: 16 % — ABNORMAL HIGH (ref 3–12)
Neutro Abs: 12.5 10*3/uL — ABNORMAL HIGH (ref 1.7–7.7)
Neutro Abs: 14.5 10*3/uL — ABNORMAL HIGH (ref 1.7–7.7)
Neutrophils Relative %: 72 % (ref 43–77)
Platelets: 421 10*3/uL — ABNORMAL HIGH (ref 150–400)
RDW: 20 % — ABNORMAL HIGH (ref 11.5–15.5)
RDW: 20.1 % — ABNORMAL HIGH (ref 11.5–15.5)
WBC: 17.4 10*3/uL — ABNORMAL HIGH (ref 4.0–10.5)
WBC: 20.4 10*3/uL — ABNORMAL HIGH (ref 4.0–10.5)

## 2013-11-04 MED ORDER — HYDROCODONE-ACETAMINOPHEN 5-325 MG PO TABS
1.0000 | ORAL_TABLET | ORAL | Status: DC | PRN
  Administered 2013-11-05 – 2013-11-12 (×5): 1 via ORAL
  Filled 2013-11-04 (×5): qty 1

## 2013-11-04 MED ORDER — INSULIN ASPART 100 UNIT/ML ~~LOC~~ SOLN
0.0000 [IU] | Freq: Every day | SUBCUTANEOUS | Status: DC
  Administered 2013-11-04: 3 [IU] via SUBCUTANEOUS

## 2013-11-04 MED ORDER — INSULIN GLARGINE 100 UNIT/ML ~~LOC~~ SOLN
5.0000 [IU] | Freq: Every day | SUBCUTANEOUS | Status: DC
  Administered 2013-11-04: 5 [IU] via SUBCUTANEOUS
  Filled 2013-11-04 (×2): qty 0.05

## 2013-11-04 MED ORDER — FUROSEMIDE 40 MG PO TABS
40.0000 mg | ORAL_TABLET | Freq: Every day | ORAL | Status: DC
  Administered 2013-11-05 – 2013-11-13 (×9): 40 mg via ORAL
  Filled 2013-11-04 (×11): qty 1

## 2013-11-04 MED ORDER — INSULIN GLARGINE 100 UNIT/ML ~~LOC~~ SOLN
20.0000 [IU] | Freq: Every day | SUBCUTANEOUS | Status: DC
  Administered 2013-11-04: 20 [IU] via SUBCUTANEOUS
  Filled 2013-11-04 (×3): qty 0.2

## 2013-11-04 MED ORDER — ENOXAPARIN SODIUM 40 MG/0.4ML ~~LOC~~ SOLN
40.0000 mg | SUBCUTANEOUS | Status: DC
  Administered 2013-11-04 – 2013-11-12 (×9): 40 mg via SUBCUTANEOUS
  Filled 2013-11-04 (×11): qty 0.4

## 2013-11-04 MED ORDER — SENNOSIDES 8.6 MG PO TABS: 1.0000 | ORAL_TABLET | Freq: Every day | ORAL | Status: DC

## 2013-11-04 MED ORDER — POTASSIUM CHLORIDE CRYS ER 20 MEQ PO TBCR
20.0000 meq | EXTENDED_RELEASE_TABLET | Freq: Every day | ORAL | Status: DC
  Administered 2013-11-04 – 2013-11-05 (×2): 20 meq via ORAL
  Filled 2013-11-04 (×2): qty 1

## 2013-11-04 MED ORDER — SODIUM CHLORIDE 0.9 % IV SOLN
250.0000 mL | INTRAVENOUS | Status: DC | PRN
  Administered 2013-11-04: 17:00:00 via INTRAVENOUS
  Administered 2013-11-07: 250 mL via INTRAVENOUS

## 2013-11-04 MED ORDER — TRAZODONE HCL 50 MG PO TABS: 25.0000 mg | ORAL_TABLET | Freq: Every evening | ORAL | Status: DC | PRN

## 2013-11-04 MED ORDER — ACETAMINOPHEN 650 MG RE SUPP: 650.0000 mg | Freq: Four times a day (QID) | RECTAL | Status: DC | PRN

## 2013-11-04 MED ORDER — SODIUM CHLORIDE 0.9 % IJ SOLN
3.0000 mL | Freq: Two times a day (BID) | INTRAMUSCULAR | Status: DC
  Administered 2013-11-10: 3 mL via INTRAVENOUS

## 2013-11-04 MED ORDER — ONDANSETRON HCL 4 MG/2ML IJ SOLN: 4.0000 mg | Freq: Four times a day (QID) | INTRAMUSCULAR | Status: DC | PRN

## 2013-11-04 MED ORDER — INSULIN ASPART 100 UNIT/ML ~~LOC~~ SOLN
0.0000 [IU] | Freq: Three times a day (TID) | SUBCUTANEOUS | Status: DC
  Administered 2013-11-04: 3 [IU] via SUBCUTANEOUS
  Administered 2013-11-05 – 2013-11-07 (×2): 1 [IU] via SUBCUTANEOUS
  Administered 2013-11-07: 3 [IU] via SUBCUTANEOUS
  Administered 2013-11-08: 1 [IU] via SUBCUTANEOUS
  Administered 2013-11-08 – 2013-11-09 (×3): 3 [IU] via SUBCUTANEOUS
  Administered 2013-11-09 – 2013-11-10 (×2): 2 [IU] via SUBCUTANEOUS
  Administered 2013-11-10: 3 [IU] via SUBCUTANEOUS
  Administered 2013-11-10 – 2013-11-11 (×4): 2 [IU] via SUBCUTANEOUS
  Administered 2013-11-13: 1 [IU] via SUBCUTANEOUS

## 2013-11-04 MED ORDER — ONDANSETRON HCL 4 MG PO TABS: 4.0000 mg | ORAL_TABLET | Freq: Four times a day (QID) | ORAL | Status: DC | PRN

## 2013-11-04 MED ORDER — SENNA 8.6 MG PO TABS
1.0000 | ORAL_TABLET | Freq: Every day | ORAL | Status: DC
  Administered 2013-11-04 – 2013-11-13 (×9): 8.6 mg via ORAL
  Filled 2013-11-04 (×11): qty 1

## 2013-11-04 MED ORDER — DEXTROSE 5 % IV SOLN
2.0000 g | INTRAVENOUS | Status: DC
  Administered 2013-11-05: 2 g via INTRAVENOUS
  Filled 2013-11-04: qty 2

## 2013-11-04 MED ORDER — SODIUM CHLORIDE 0.9 % IJ SOLN: 3.0000 mL | INTRAMUSCULAR | Status: DC | PRN

## 2013-11-04 MED ORDER — ALUM & MAG HYDROXIDE-SIMETH 200-200-20 MG/5ML PO SUSP: 30.0000 mL | Freq: Four times a day (QID) | ORAL | Status: DC | PRN

## 2013-11-04 MED ORDER — ACETAMINOPHEN 325 MG PO TABS: 650.0000 mg | ORAL_TABLET | Freq: Four times a day (QID) | ORAL | Status: DC | PRN

## 2013-11-04 MED ORDER — SODIUM CHLORIDE 0.9 % IV SOLN
INTRAVENOUS | Status: DC
  Administered 2013-11-04: 13:00:00 via INTRAVENOUS

## 2013-11-04 MED ORDER — PANTOPRAZOLE SODIUM 40 MG PO TBEC
40.0000 mg | DELAYED_RELEASE_TABLET | Freq: Every day | ORAL | Status: DC
  Administered 2013-11-04 – 2013-11-13 (×10): 40 mg via ORAL
  Filled 2013-11-04 (×10): qty 1

## 2013-11-04 MED ORDER — SODIUM CHLORIDE 0.9 % IJ SOLN
10.0000 mL | INTRAMUSCULAR | Status: DC | PRN
  Administered 2013-11-04 – 2013-11-13 (×4): 10 mL

## 2013-11-04 MED ORDER — POLYETHYLENE GLYCOL 3350 17 GM/SCOOP PO POWD
17.0000 g | Freq: Every day | ORAL | Status: DC
  Filled 2013-11-04: qty 255

## 2013-11-04 MED ORDER — LOSARTAN POTASSIUM 50 MG PO TABS
100.0000 mg | ORAL_TABLET | Freq: Every day | ORAL | Status: DC
  Administered 2013-11-05 – 2013-11-13 (×9): 100 mg via ORAL
  Filled 2013-11-04 (×11): qty 2

## 2013-11-04 MED ORDER — SODIUM CHLORIDE 0.9 % IJ SOLN
10.0000 mL | Freq: Two times a day (BID) | INTRAMUSCULAR | Status: DC
  Administered 2013-11-05 – 2013-11-12 (×6): 10 mL

## 2013-11-04 MED ORDER — AMLODIPINE BESYLATE 5 MG PO TABS
5.0000 mg | ORAL_TABLET | Freq: Every day | ORAL | Status: DC
  Administered 2013-11-05 – 2013-11-13 (×9): 5 mg via ORAL
  Filled 2013-11-04 (×11): qty 1

## 2013-11-04 MED ORDER — DEXTROSE 5 % IV SOLN
2.0000 g | Freq: Once | INTRAVENOUS | Status: AC
  Administered 2013-11-04: 2 g via INTRAVENOUS
  Filled 2013-11-04: qty 2

## 2013-11-04 MED ORDER — SODIUM CHLORIDE 0.9 % IJ SOLN
10.0000 mL | INTRAMUSCULAR | Status: DC | PRN
  Administered 2013-11-04: 10 mL via INTRAVENOUS

## 2013-11-04 MED ORDER — ALPRAZOLAM 0.5 MG PO TABS
1.0000 mg | ORAL_TABLET | Freq: Three times a day (TID) | ORAL | Status: DC | PRN
  Administered 2013-11-08 – 2013-11-12 (×5): 1 mg via ORAL
  Filled 2013-11-04 (×5): qty 2

## 2013-11-04 NOTE — Progress Notes (Signed)
Patient is seen as a work-in today.    [name] was admitted to the [facility] from 10/29/2013- 11/01/2013.  Her attending was her PCP, Dr. Renard Matter.  She presented to the ED with fevers. Oncology was not consulted during her hospital course to our knowledge.  During her hospital course, she was identified to have bacteremia and E.Coli grew from the right antecubital and E. Coli and enterococcus species grew from port-a-cath.  To complicate the situation more,  Shigella grew from from GI pathogen panel of stool.   The E. Coli and enterococcus was identified to be sensitive to Cipro and therefore she was treated and discharged on PO Cipro without a repeat blood culture proving clearance of bactermia.    She was discharged then then underwent outpatient restaging scans ordered by [place], [facility].   CT scan report follows below, but generally speaking, her malignancy is responding to chemotherapy but she was noted to have a > 4 cm intrahepatic abscess.    Due to the size of this abscess, antibiotics will not likely be sufficient and therefore I contacted Dr. Karilyn Cota (GI) after reviewing these scans.  He agreed that [name] would benefit from percutaneous drainage of this abscess and recommended IR.    I placed the order for this procedure and Dr. Grace Isaac was gracious enough to contact me to discuss the patient's case.  We had a long discussion and we concluded that [name] would benefit from IV antibiotics prior to the procedure in an inpatient setting.  With this information, I contacted Dr. Kerry Hough (Hospitalist) to help with Logistics.  We have concluded that [name] would come to the clinic today for IV Rocephin.  I ordered PO Flagyl as an outpatient with Cipro to cover for anaerobes that are likely associated with intrahepatic abscess prior to my discussion with Dr. Kerry Hough.  Dr. Kerry Hough prefers that the patient be admitted to [facility] so ID could follow this patient due to her complicated infections.   Dr. Grace Isaac agrees that [facility] will be beneficial and the IR procedure could be done at [facility].  Dr. Kerry Hough and Autumn Smothers, NP have been gracious enough to perform admission note to aid the hospitalists at [facility] with the patient's admission.  In the clinic PT/INR and aPTT have been ordered and drawn.  Results should be in [facility] in a few minutes. She is to be NPO after midnight.  Dr. Grace Isaac will plan for the procedure tomorrow. She is receiving IV Rocephin presently at the [facility].   RADIOLOGY:  11/03/2013  CLINICAL DATA: Metastatic pancreatic cancer, left lower quadrant  pain  EXAM:  CT CHEST, ABDOMEN, AND PELVIS WITH CONTRAST  TECHNIQUE:  Multidetector CT imaging of the chest, abdomen and pelvis was  performed following the standard protocol during bolus  administration of intravenous contrast.  CONTRAST: 80mL OMNIPAQUE IOHEXOL 300 MG/ML SOLN  COMPARISON: CT abdomen pelvis dated 07/28/2013. CT chest dated  05/09/2013.  FINDINGS:  CT CHEST FINDINGS  Mild linear scarring at the medial right lung base (series 3/image  50). Prior right lower lobe pulmonary nodule is no longer well  visualized.  Linear scarring in the right middle lobe and lingula. Moderate  centrilobular emphysematous changes, upper lobe predominant. Trace  bilateral pleural effusions. No pneumothorax.  Left thyroid is mildly nodular/heterogeneous.  Mild cardiomegaly. Trace pericardial effusion. Mild atherosclerotic  calcifications of the aortic arch.  Small thoracic lymph nodes which do not meet pathologic CT size  criteria,  including:  --6 mm short axis prevascular node (series 2/image 21)  --7 mm short axis left axillary node (series 2/image 21)  --8 mm short axis subcarinal node (series 2/image 30)  Degenerative changes of the thoracic spine.  CT ABDOMEN AND PELVIS FINDINGS  Multifocal hepatic metastases, mildly improved, including:  --3.5 x 2.6 cm metastasis in the anterior segment  right hepatic lobe  (series 2/ image 49), previously 4.5 x 3.5 cm  --2.7 x 2.6 cm metastasis in the posterior segment right hepatic  dome (series 2/ image 51), previously 3.3 x 4.0 cm  --2.4 x 2.5 cm metastasis in the medial segment left hepatic lobe  (series 2/ image 59), previously 3.5 x 3.8 cm.  Interval development of an irregular 4.6 x 4.4 cm multiloculated  fluid density cystic lesion within the anterior segment right  hepatic lobe and encroaching upon the gallbladder fossa (series 2/  images 67 and 70). This appearance is worrisome for a possible  hepatic abscess.  Pneumobilia, predominantly within the left hepatic lobe. Indwelling  metallic internal biliary stent.  Ill-defined pancreatic tail mass measuring approximately 3.7 x 6.2  cm (series 2/ image 59), previously 5.0 x 6.6 cm. Enhancing soft  tissue component extends inferiorly and involves the left kidney  (series 2/image 53). Suspected involvement of the splenic hilum with  heterogeneous perfusion of the spleen. Overall, this appearance is  improved.  Gallbladder is grossly unremarkable. No intrahepatic or extrahepatic  ductal dilatation.  Bilateral adrenal glands and right kidney are grossly unremarkable.  2.3 cm left para-aortic nodal metastasis (series 2/image 70),  grossly unchanged.  Moderate abdominopelvic ascites, malignant. Associated omental soft  tissue beneath the left anterior abdominal wall with discrete 10 x  12 mm implant (series 2/ image 75).  No evidence of bowel obstruction.  Mild atherosclerotic calcifications of the abdominal aorta.  Status post hysterectomy. Bilateral ovaries are unremarkable.  Bladder is underdistended.  Body wall edema.  Mild degenerative changes at L5-S1.  IMPRESSION:  6.2 cm ill-defined pancreatic tail mass, decreased. Involvement of  the left kidney and spleen.  Multifocal hepatic metastases, mildly improved. 2.3 cm left  para-aortic nodal metastasis, unchanged. Moderate  abdominopelvic  ascites, malignant, with associated peritoneal nodularity.  Prior medial right lower lobe metastasis has essentially resolved  with residual scarring.  4.6 cm complex fluid density lesion in the anterior segment right  hepatic lobe, new, worrisome for hepatic abscess.  These results were called by telephone at the time of interpretation  on 11/03/2013 at 1:08 PM to [facility], who verbally acknowledged  these results.  Electronically Signed  By: Charline Bills M.D.  On: 11/03/2013 13:10   Patient and plan discussed with Dr. Alla German and he is in agreement with the aforementioned.   Blonnie Maske

## 2013-11-04 NOTE — Progress Notes (Signed)
.  [name] arrives today for IV antibiotic and to discuss further treatment for hepatic abscess.

## 2013-11-04 NOTE — Consult Note (Signed)
HPI: [name] is an 67 y.o. female with multiple medical problems as well as pancreatic cancer with liver mets. However, she is now found to have a hepatic abscess as well. Images were reviewed and IR is requested to percutaneously drain this abscess. She has been admitted to [facility] for IV abx and procedure. Chart, PMHx, meds reviewed.  Past Medical History:  Past Medical History  Diagnosis Date  . Diabetes mellitus without complication   . Hypertension   . Anxiety   . Arthritis   . Pancreatic cancer   . 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 Peripheral neuropathy 10/13/2013  . Liver abscess 11/04/2013    Past Surgical History:  Past Surgical History  Procedure Laterality Date  . Abdominal hysterectomy    . Colonoscopy  2006    Dr. Jena Gauss, polyp, benign  . Ercp  05/10/2013    Procedure: ENDOSCOPIC RETROGRADE CHOLANGIOPANCREATOGRAPHY (ERCP)(DIFFICULT CANNULATION) ;  Surgeon: Malissa Hippo, MD;  Location: [facility];  Service: Endoscopy;;  . Biliary stent placement  05/10/2013    Procedure: BILIARY STENT PLACEMENT ( X WALLFLEX STENT);  Surgeon: Malissa Hippo, MD;  Location: [facility];  Service: Endoscopy;;  . Sphincterotomy  05/10/2013    Procedure: SPHINCTEROTOMY (PRECUT MADE WITH NEEDLE KNIFE);  Surgeon: Malissa Hippo, MD;  Location: [facility];  Service: Endoscopy;;  . Liver biopsy  05/12/2013  . Portacath placement Left 05/23/2013    Procedure: INSERTION PORT-A-CATH;  Surgeon: Fabio Bering, MD;  Location: [facility];  Service: General;  Laterality: Left;    Family History:  Family History  Problem Relation Age of Onset  . Colon cancer Neg Hx   . Liver disease Neg Hx   . Bone cancer Brother   . Cancer Brother   . Hypertension Mother     Social History:  reports that she has quit smoking. She quit smokeless tobacco use about 7 months ago. She reports that she does not drink alcohol or use illicit drugs.  Allergies: No Known  Allergies  Medications:   Medication List    ASK your doctor about these medications       ABRAXANE IV  Inject into the vein. Day 1, 8, 15 every 28 days     ALPRAZolam 1 MG tablet  Commonly known as:  XANAX  Take 1 mg by mouth 3 (three) times daily as needed for sleep or anxiety.     amLODipine 5 MG tablet  Commonly known as:  NORVASC  Take 5 mg by mouth daily.     dexamethasone 4 MG tablet  Commonly known as:  DECADRON  Take 1 tablet in the am and 1 tablet in the pm x 2 days after chemo.     furosemide 40 MG tablet  Commonly known as:  LASIX  Take 1 tablet (40 mg total) by mouth daily.     gabapentin 300 MG capsule  Commonly known as:  NEURONTIN  Take 1 tab at HS x 3 days, then 2 tab at HS x 3 days, then 3 tabs at HS thereafter     GEMZAR IV  Inject into the vein. Day 1, 8, 15 every 28 days     heparin lock flush 100 UNIT/ML Soln injection  5 mLs (500 Units total) by Intracatheter route daily as needed [facility]).     HYDROCODONE-ACETAMINOPHEN PO  Take 1 tablet by mouth as needed (only takes when knee is hurting - averages 2 x weekly.).  insulin glargine 100 UNIT/ML injection  Commonly known as:  LANTUS  Inject 20 Units into the skin at bedtime.     LORazepam 0.5 MG tablet  Commonly known as:  ATIVAN  Take 0.5 mg by mouth. May take 1 -2 tabs every 6 hours IF needed for nausea/vomiting. Start with 1 tab and increase to 2 tabs if needed. (Do not take Xanax within 2 hours of taking Ativan).     losartan 100 MG tablet  Commonly known as:  COZAAR  Take 100 mg by mouth daily.     metroNIDAZOLE 500 MG tablet  Commonly known as:  FLAGYL  Take 1 tablet (500 mg total) by mouth 3 (three) times daily.     omeprazole 20 MG capsule  Commonly known as:  PRILOSEC  Take 1 capsule (20 mg total) by mouth daily.     polyethylene glycol powder powder  Commonly known as:  GLYCOLAX/MIRALAX  Take 17 g by mouth daily.     potassium chloride SA 20 MEQ tablet  Commonly  known as:  K-DUR,KLOR-CON  Take 1 tablet (20 mEq total) by mouth daily.     senna 8.6 MG tablet  Commonly known as:  SENOKOT  Take 1 tablet by mouth daily.        Please HPI for pertinent positives, otherwise complete 10 system ROS negative.  Physical Exam: BP 149/86  Pulse 108  Temp(Src) 98.8 F (37.1 C) (Oral)  Resp 18  Ht 5\' 5"  (1.651 m)  Wt 166 lb 9.6 oz (75.569 kg)  BMI 27.72 kg/m2  SpO2 97% Body mass index is 27.72 kg/(m^2).   General Appearance:  Alert, cooperative, no distress, appears stated age  Head:  Normocephalic, without obvious abnormality, atraumatic  ENT: Unremarkable  Neck: Supple, symmetrical, trachea midline  Lungs:   Clear to auscultation bilaterally, no w/r/r.  Chest Wall:  No tenderness or deformity  Heart:  Regular rate and rhythm, S1, S2 normal, no murmur, rub or gallop.  Abdomen:   Soft, non-tender, non distended.  Neurologic: Normal affect, no gross deficits.   Results for orders placed in visit on 11/04/13 (from the past 48 hour(s))  PROTIME-INR     Status: None   Collection Time    11/04/13  1:05 PM      Result Value Range   Prothrombin Time 15.2  11.6 - 15.2 seconds   INR 1.23  0.00 - 1.49  APTT     Status: Abnormal   Collection Time    11/04/13  1:05 PM      Result Value Range   aPTT 38 (*) 24 - 37 seconds   Comment:            IF BASELINE aPTT IS ELEVATED,     SUGGEST PATIENT RISK ASSESSMENT     BE USED TO DETERMINE APPROPRIATE     ANTICOAGULANT THERAPY.  CBC WITH DIFFERENTIAL     Status: Abnormal   Collection Time    11/04/13  1:50 PM      Result Value Range   WBC 17.4 (*) 4.0 - 10.5 K/uL   RBC 2.74 (*) 3.87 - 5.11 MIL/uL   Hemoglobin 7.3 (*) 12.0 - 15.0 g/dL   HCT 30.8 (*) 65.7 - 84.6 %   MCV 83.6  78.0 - 100.0 fL   MCH 26.6  26.0 - 34.0 pg   MCHC 31.9  30.0 - 36.0 g/dL   RDW 96.2 (*) 95.2 - 84.1 %   Platelets 412 (*) 150 -  400 K/uL   Neutrophils Relative % 72  43 - 77 %   Lymphocytes Relative 13  12 - 46 %    Monocytes Relative 14 (*) 3 - 12 %   Eosinophils Relative 1  0 - 5 %   Basophils Relative 0  0 - 1 %   Neutro Abs 12.5 (*) 1.7 - 7.7 K/uL   Lymphs Abs 2.3  0.7 - 4.0 K/uL   Monocytes Absolute 2.4 (*) 0.1 - 1.0 K/uL   Eosinophils Absolute 0.2  0.0 - 0.7 K/uL   Basophils Absolute 0.0  0.0 - 0.1 K/uL   RBC Morphology POLYCHROMASIA PRESENT     Comment: RARE NRBCs   WBC Morphology ATYPICAL LYMPHOCYTES     Comment: ATYPICAL MONONUCLEAR CELLS     INCREASED BANDS (>20% BANDS)     DOHLE BODIES   Smear Review PLATELET COUNT CONFIRMED BY SMEAR     Comment: PLATELETS APPEAR INCREASED     LARGE PLATELETS PRESENT     GIANT PLATELETS SEEN  COMPREHENSIVE METABOLIC PANEL     Status: Abnormal   Collection Time    11/04/13  1:50 PM      Result Value Range   Sodium 135  135 - 145 mEq/L   Potassium 3.3 (*) 3.5 - 5.1 mEq/L   Chloride 100  96 - 112 mEq/L   CO2 24  19 - 32 mEq/L   Glucose, Bld 267 (*) 70 - 99 mg/dL   BUN 10  6 - 23 mg/dL   Creatinine, Ser 1.61 (*) 0.50 - 1.10 mg/dL   Calcium 8.3 (*) 8.4 - 10.5 mg/dL   Total Protein 6.9  6.0 - 8.3 g/dL   Albumin 1.8 (*) 3.5 - 5.2 g/dL   AST 11  0 - 37 U/L   ALT 8  0 - 35 U/L   Alkaline Phosphatase 129 (*) 39 - 117 U/L   Total Bilirubin 0.3  0.3 - 1.2 mg/dL   GFR calc non Af Amer 47 (*) >90 mL/min   GFR calc Af Amer 55 (*) >90 mL/min   Comment: (NOTE)     The eGFR has been calculated using the CKD EPI equation.     This calculation has not been validated in all clinical situations.     eGFR's persistently <90 mL/min signify possible Chronic Kidney     Disease.   Ct Chest W Contrast  11/03/2013   CLINICAL DATA:  Metastatic pancreatic cancer, left lower quadrant pain  EXAM: CT CHEST, ABDOMEN, AND PELVIS WITH CONTRAST  TECHNIQUE: Multidetector CT imaging of the chest, abdomen and pelvis was performed following the standard protocol during bolus administration of intravenous contrast.  CONTRAST:  80mL OMNIPAQUE IOHEXOL 300 MG/ML  SOLN  COMPARISON:  CT  abdomen pelvis dated 07/28/2013. CT chest dated 05/09/2013.  FINDINGS: CT CHEST FINDINGS  Mild linear scarring at the medial right lung base (series 3/image 50). Prior right lower lobe pulmonary nodule is no longer well visualized.  Linear scarring in the right middle lobe and lingula. Moderate centrilobular emphysematous changes, upper lobe predominant. Trace bilateral pleural effusions. No pneumothorax.  Left thyroid is mildly nodular/heterogeneous.  Mild cardiomegaly. Trace pericardial effusion. Mild atherosclerotic calcifications of the aortic arch.  Small thoracic lymph nodes which do not meet pathologic CT size criteria, including:  --6 mm short axis prevascular node (series 2/image 21)  --7 mm short axis left axillary node (series 2/image 21)  --8 mm short axis subcarinal node (series 2/image 30)  Degenerative changes  of the thoracic spine.  CT ABDOMEN AND PELVIS FINDINGS  Multifocal hepatic metastases, mildly improved, including:  --3.5 x 2.6 cm metastasis in the anterior segment right hepatic lobe (series 2/ image 49), previously 4.5 x 3.5 cm  --2.7 x 2.6 cm metastasis in the posterior segment right hepatic dome (series 2/ image 51), previously 3.3 x 4.0 cm  --2.4 x 2.5 cm metastasis in the medial segment left hepatic lobe (series 2/ image 59), previously 3.5 x 3.8 cm.  Interval development of an irregular 4.6 x 4.4 cm multiloculated fluid density cystic lesion within the anterior segment right hepatic lobe and encroaching upon the gallbladder fossa (series 2/ images 67 and 70). This appearance is worrisome for a possible hepatic abscess.  Pneumobilia, predominantly within the left hepatic lobe. Indwelling metallic internal biliary stent.  Ill-defined pancreatic tail mass measuring approximately 3.7 x 6.2 cm (series 2/ image 59), previously 5.0 x 6.6 cm. Enhancing soft tissue component extends inferiorly and involves the left kidney (series 2/image 53). Suspected involvement of the splenic hilum with  heterogeneous perfusion of the spleen. Overall, this appearance is improved.  Gallbladder is grossly unremarkable. No intrahepatic or extrahepatic ductal dilatation.  Bilateral adrenal glands and right kidney are grossly unremarkable.  2.3 cm left para-aortic nodal metastasis (series 2/image 70), grossly unchanged.  Moderate abdominopelvic ascites, malignant. Associated omental soft tissue beneath the left anterior abdominal wall with discrete 10 x 12 mm implant (series 2/ image 75).  No evidence of bowel obstruction.  Mild atherosclerotic calcifications of the abdominal aorta.  Status post hysterectomy.  Bilateral ovaries are unremarkable.  Bladder is underdistended.  Body wall edema.  Mild degenerative changes at L5-S1.  IMPRESSION: 6.2 cm ill-defined pancreatic tail mass, decreased. Involvement of the left kidney and spleen.  Multifocal hepatic metastases, mildly improved. 2.3 cm left para-aortic nodal metastasis, unchanged. Moderate abdominopelvic ascites, malignant, with associated peritoneal nodularity.  Prior medial right lower lobe metastasis has essentially resolved with residual scarring.  4.6 cm complex fluid density lesion in the anterior segment right hepatic lobe, new, worrisome for hepatic abscess.  These results were called by telephone at the time of interpretation on 11/03/2013 at 1:08 PM to [facility], who verbally acknowledged these results.   Electronically Signed   By: Charline Bills M.D.   On: 11/03/2013 13:10   Ct Abdomen Pelvis W Contrast  11/03/2013   CLINICAL DATA:  Metastatic pancreatic cancer, left lower quadrant pain  EXAM: CT CHEST, ABDOMEN, AND PELVIS WITH CONTRAST  TECHNIQUE: Multidetector CT imaging of the chest, abdomen and pelvis was performed following the standard protocol during bolus administration of intravenous contrast.  CONTRAST:  80mL OMNIPAQUE IOHEXOL 300 MG/ML  SOLN  COMPARISON:  CT abdomen pelvis dated 07/28/2013. CT chest dated 05/09/2013.  FINDINGS: CT CHEST  FINDINGS  Mild linear scarring at the medial right lung base (series 3/image 50). Prior right lower lobe pulmonary nodule is no longer well visualized.  Linear scarring in the right middle lobe and lingula. Moderate centrilobular emphysematous changes, upper lobe predominant. Trace bilateral pleural effusions. No pneumothorax.  Left thyroid is mildly nodular/heterogeneous.  Mild cardiomegaly. Trace pericardial effusion. Mild atherosclerotic calcifications of the aortic arch.  Small thoracic lymph nodes which do not meet pathologic CT size criteria, including:  --6 mm short axis prevascular node (series 2/image 21)  --7 mm short axis left axillary node (series 2/image 21)  --8 mm short axis subcarinal node (series 2/image 30)  Degenerative changes of the thoracic spine.  CT ABDOMEN AND  PELVIS FINDINGS  Multifocal hepatic metastases, mildly improved, including:  --3.5 x 2.6 cm metastasis in the anterior segment right hepatic lobe (series 2/ image 49), previously 4.5 x 3.5 cm  --2.7 x 2.6 cm metastasis in the posterior segment right hepatic dome (series 2/ image 51), previously 3.3 x 4.0 cm  --2.4 x 2.5 cm metastasis in the medial segment left hepatic lobe (series 2/ image 59), previously 3.5 x 3.8 cm.  Interval development of an irregular 4.6 x 4.4 cm multiloculated fluid density cystic lesion within the anterior segment right hepatic lobe and encroaching upon the gallbladder fossa (series 2/ images 67 and 70). This appearance is worrisome for a possible hepatic abscess.  Pneumobilia, predominantly within the left hepatic lobe. Indwelling metallic internal biliary stent.  Ill-defined pancreatic tail mass measuring approximately 3.7 x 6.2 cm (series 2/ image 59), previously 5.0 x 6.6 cm. Enhancing soft tissue component extends inferiorly and involves the left kidney (series 2/image 53). Suspected involvement of the splenic hilum with heterogeneous perfusion of the spleen. Overall, this appearance is improved.   Gallbladder is grossly unremarkable. No intrahepatic or extrahepatic ductal dilatation.  Bilateral adrenal glands and right kidney are grossly unremarkable.  2.3 cm left para-aortic nodal metastasis (series 2/image 70), grossly unchanged.  Moderate abdominopelvic ascites, malignant. Associated omental soft tissue beneath the left anterior abdominal wall with discrete 10 x 12 mm implant (series 2/ image 75).  No evidence of bowel obstruction.  Mild atherosclerotic calcifications of the abdominal aorta.  Status post hysterectomy.  Bilateral ovaries are unremarkable.  Bladder is underdistended.  Body wall edema.  Mild degenerative changes at L5-S1.  IMPRESSION: 6.2 cm ill-defined pancreatic tail mass, decreased. Involvement of the left kidney and spleen.  Multifocal hepatic metastases, mildly improved. 2.3 cm left para-aortic nodal metastasis, unchanged. Moderate abdominopelvic ascites, malignant, with associated peritoneal nodularity.  Prior medial right lower lobe metastasis has essentially resolved with residual scarring.  4.6 cm complex fluid density lesion in the anterior segment right hepatic lobe, new, worrisome for hepatic abscess.  These results were called by telephone at the time of interpretation on 11/03/2013 at 1:08 PM to [facility], who verbally acknowledged these results.   Electronically Signed   By: Charline Bills M.D.   On: 11/03/2013 13:10    Assessment/Plan Pancreatic cancer with liver mets. Hepatic abscess. Discussed CT guided drainage with moderate sedation Explained procedure, risks, complications, drain expectations, and risks of sedation. Labs reviewed. Consent signed in chart  Brayton El PA-C 11/04/2013, 4:20 PM

## 2013-11-04 NOTE — Addendum Note (Signed)
Addended by: Ellouise Newer on: 11/04/2013 01:42 PM   Modules accepted: Orders

## 2013-11-04 NOTE — H&P (Signed)
[facility] History and Physical  [name] QMV:[id number] DOB: July 08, 1946 DOA: (Not on file)  Referring physician:  PCP: [name], MD  Specialists:   Chief Complaint: liver abscess per CT abdomen  HPI: [name] is a 67 y.o. female with a past medical history that includes diabetes, hypertension, pancreatic cancer with metastases to the liver, liver abscess, bacteremia presents to the [facility] cancer Center due to liver abscess found on CT abdomen pelvis done for cancer staging. Information is obtained from the patient and the chart. Patient admitted to [facility] from 10/29/2013 to 11/01/2013 with fevers. During that hospitalization she was found to have bacteremia and Escherichia coli grew from the right antecubital and Escherichia coli and enterococcus species grew from Port-A-Cath. In addition Shigella group from GI pathogen panel of stool. Her Escherichia coli and enterococcus was identified to be sensitive to Cipro she was discharged on by mouth Cipro and Flagyl without a repeat blood culture proving clearance of bacteremia. She was discharged and underwent outpatient restaging scan ordered by oncology team at the [facility]. The CT scan report yields a greater than 4 cm intrahepatic abscess. Per oncology due to the size of this abscess antibiotics would not likely be sufficient and GI was consulted. Dr. Karilyn Cota reviewed the scans and opined that patient would benefit from percutaneous drainage of abscess and recommended interventional radiology. Dellis Anes, placed the order for interventional radiology to drain abscess after speaking with Dr. Grace Isaac who agreed to see the patient. In addition IV antibiotics were recommended in an inpatient setting. Patient received 1 dose of Rocephin today prior to admission in the oncology clinic. Patient will be admitted to [place] so infectious disease can also follow due to her complicated infections.  Patient reports over the last 2 days since she was discharged to home she has had intermittent fevers. She states oral temperature max was 102.0. She denies any nausea vomiting diarrhea. She denies any chest pain palpitation shortness of breath headache visual disturbances. She denies dysuria hematuria frequency or urgency.  I have contacted our colleagues at [facility] and patient will be assigned a medical bed under the hospitalist team there.   Review of Systems: 10 point review of systems completed all systems are negative except as indicated in the history of present illness   Past Medical History  Diagnosis Date  . Diabetes mellitus without complication   . Hypertension   . Anxiety   . Arthritis   . Pancreatic cancer   . 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 Peripheral neuropathy 10/13/2013  . Liver abscess 11/04/2013   Past Surgical History  Procedure Laterality Date  . Abdominal hysterectomy    . Colonoscopy  2006    Dr. Jena Gauss, polyp, benign  . Ercp  05/10/2013    Procedure: ENDOSCOPIC RETROGRADE CHOLANGIOPANCREATOGRAPHY (ERCP)(DIFFICULT CANNULATION) ;  Surgeon: Malissa Hippo, MD;  Location: [facility] ORS;  Service: Endoscopy;;  . Biliary stent placement  05/10/2013    Procedure: BILIARY STENT PLACEMENT ( X WALLFLEX STENT);  Surgeon: Malissa Hippo, MD;  Location: [facility];  Service: Endoscopy;;  . Sphincterotomy  05/10/2013    Procedure: SPHINCTEROTOMY (PRECUT MADE WITH NEEDLE KNIFE);  Surgeon: Malissa Hippo, MD;  Location: [facility];  Service: Endoscopy;;  . Liver biopsy  05/12/2013  . Portacath placement Left 05/23/2013    Procedure: INSERTION PORT-A-CATH;  Surgeon: Fabio Bering, MD;  Location: [facility] ORS;  Service: General;  Laterality: Left;  Social History:  reports that she has quit smoking. She quit smokeless tobacco use about 7 months ago. She reports that she does not drink alcohol or use illicit drugs. She lives at home with her  daughter. He is independent with ADLs. She is retired  No Known Allergies  Family History  Problem Relation Age of Onset  . Colon cancer Neg Hx   . Liver disease Neg Hx   . Bone cancer Brother   . Cancer Brother   . Hypertension Mother    father deceased at 110 from emphysema. Mother still alive and has hypertension. She is total of 6 brothers and sisters who collective medical history is positive for diabetes negative for stroke heart attack.  Prior to Admission medications   Medication Sig Start Date End Date Taking? Authorizing Provider  ALPRAZolam Prudy Feeler) 1 MG tablet Take 1 mg by mouth 3 (three) times daily as needed for sleep or anxiety.    Historical Provider, MD  amLODipine (NORVASC) 5 MG tablet Take 5 mg by mouth daily.    Historical Provider, MD  dexamethasone (DECADRON) 4 MG tablet Take 1 tablet in the am and 1 tablet in the pm x 2 days after chemo. 10/27/13   Ellouise Newer, PA-C  furosemide (LASIX) 40 MG tablet Take 1 tablet (40 mg total) by mouth daily. 10/27/13   Ellouise Newer, PA-C  gabapentin (NEURONTIN) 300 MG capsule Take 1 tab at HS x 3 days, then 2 tab at HS x 3 days, then 3 tabs at HS thereafter 10/13/13   Ellouise Newer, PA-C  Gemcitabine HCl (GEMZAR IV) Inject into the vein. Day 1, 8, 15 every 28 days    Historical Provider, MD  heparin lock flush 100 UNIT/ML SOLN injection 5 mLs (500 Units total) by Intracatheter route daily as needed [facility]). 08/07/13   Isabella Stalling, MD  HYDROCODONE-ACETAMINOPHEN PO Take 1 tablet by mouth as needed (only takes when knee is hurting - averages 2 x weekly.).    Historical Provider, MD  insulin glargine (LANTUS) 100 UNIT/ML injection Inject 20 Units into the skin at bedtime.     Historical Provider, MD  LORazepam (ATIVAN) 0.5 MG tablet Take 0.5 mg by mouth. May take 1 -2 tabs every 6 hours IF needed for nausea/vomiting. Start with 1 tab and increase to 2 tabs if needed. (Do not take Xanax within 2 hours of taking Ativan).     Historical Provider, MD  losartan (COZAAR) 100 MG tablet Take 100 mg by mouth daily.    Historical Provider, MD  metroNIDAZOLE (FLAGYL) 500 MG tablet Take 1 tablet (500 mg total) by mouth 3 (three) times daily. 11/03/13   Ellouise Newer, PA-C  omeprazole (PRILOSEC) 20 MG capsule Take 1 capsule (20 mg total) by mouth daily. 06/13/13   Ellouise Newer, PA-C  PACLitaxel Protein-Bound Part (ABRAXANE IV) Inject into the vein. Day 1, 8, 15 every 28 days    Historical Provider, MD  polyethylene glycol powder (GLYCOLAX/MIRALAX) powder Take 17 g by mouth daily.    Historical Provider, MD  potassium chloride SA (K-DUR,KLOR-CON) 20 MEQ tablet Take 1 tablet (20 mEq total) by mouth daily. 10/27/13   Ellouise Newer, PA-C  senna (SENOKOT) 8.6 MG tablet Take 1 tablet by mouth daily.    Historical Provider, MD   Physical Exam: There were no vitals filed for this visit.   General:  Well-nourished appears comfortable  Eyes: Pupils are equal round reactive to light, EOMI, no  scleral icterus  ENT: Ears are clear nose without drainage oropharynx without erythema or exudate. Mucous membranes of her mouth are moist and clear. Good dentition  Neck: Supple no JVD full range of motion no lymphadenopathy  Cardiovascular: Regular rate and rhythm no murmur no gallop no rub no lower extremity edema pedal pulses are present and palpable  Respiratory: Normal effort breath sounds clear bilaterally no wheeze no rhonchi  Abdomen: Obese soft positive bowel sounds nontender throughout no mass organomegaly noted  Skin: Warm and dry no rashes or lesions  Musculoskeletal: Joints without swelling/erythema full range of motion no clubbing no cyanosis  Psychiatric: Calm  Neurologic: Cranial nerves II through XII grossly intact speech clear facial symmetry  Labs on Admission:  Basic Metabolic Panel:  Recent Labs Lab 10/29/13 1533 10/30/13 0532 10/31/13 0450  NA 134* 136 134*  K 3.6 4.5 3.4*  CL 98 102 101  CO2 23  26 23   GLUCOSE 106* 331* 347*  BUN 21 26* 28*  CREATININE 1.10 1.33* 1.43*  CALCIUM 8.7 7.9* 7.6*   Liver Function Tests:  Recent Labs Lab 10/29/13 1533 10/30/13 0532  AST 26 21  ALT 20 17  ALKPHOS 223* 163*  BILITOT 0.8 0.4  PROT 7.1 6.1  ALBUMIN 2.3* 1.8*   No results found for this basename: LIPASE, AMYLASE,  in the last 168 hours No results found for this basename: AMMONIA,  in the last 168 hours CBC:  Recent Labs Lab 10/29/13 1533 10/30/13 0532  WBC 8.1 23.8*  NEUTROABS 7.3  --   HGB 9.5* 8.3*  HCT 29.6* 25.9*  MCV 85.3 85.8  PLT 129* 157   Cardiac Enzymes: No results found for this basename: CKTOTAL, CKMB, CKMBINDEX, TROPONINI,  in the last 168 hours  BNP (last 3 results) No results found for this basename: PROBNP,  in the last 8760 hours CBG:  Recent Labs Lab 10/31/13 1711 10/31/13 2057 11/01/13 0750 11/01/13 0912 11/01/13 1133  GLUCAP 204* 126* 222* 245* 212*    Radiological Exams on Admission: Ct Chest W Contrast  11/03/2013   CLINICAL DATA:  Metastatic pancreatic cancer, left lower quadrant pain  EXAM: CT CHEST, ABDOMEN, AND PELVIS WITH CONTRAST  TECHNIQUE: Multidetector CT imaging of the chest, abdomen and pelvis was performed following the standard protocol during bolus administration of intravenous contrast.  CONTRAST:  80mL OMNIPAQUE IOHEXOL 300 MG/ML  SOLN  COMPARISON:  CT abdomen pelvis dated 07/28/2013. CT chest dated 05/09/2013.  FINDINGS: CT CHEST FINDINGS  Mild linear scarring at the medial right lung base (series 3/image 50). Prior right lower lobe pulmonary nodule is no longer well visualized.  Linear scarring in the right middle lobe and lingula. Moderate centrilobular emphysematous changes, upper lobe predominant. Trace bilateral pleural effusions. No pneumothorax.  Left thyroid is mildly nodular/heterogeneous.  Mild cardiomegaly. Trace pericardial effusion. Mild atherosclerotic calcifications of the aortic arch.  Small thoracic lymph nodes  which do not meet pathologic CT size criteria, including:  --6 mm short axis prevascular node (series 2/image 21)  --7 mm short axis left axillary node (series 2/image 21)  --8 mm short axis subcarinal node (series 2/image 30)  Degenerative changes of the thoracic spine.  CT ABDOMEN AND PELVIS FINDINGS  Multifocal hepatic metastases, mildly improved, including:  --3.5 x 2.6 cm metastasis in the anterior segment right hepatic lobe (series 2/ image 49), previously 4.5 x 3.5 cm  --2.7 x 2.6 cm metastasis in the posterior segment right hepatic dome (series 2/ image 51), previously 3.3 x  4.0 cm  --2.4 x 2.5 cm metastasis in the medial segment left hepatic lobe (series 2/ image 59), previously 3.5 x 3.8 cm.  Interval development of an irregular 4.6 x 4.4 cm multiloculated fluid density cystic lesion within the anterior segment right hepatic lobe and encroaching upon the gallbladder fossa (series 2/ images 67 and 70). This appearance is worrisome for a possible hepatic abscess.  Pneumobilia, predominantly within the left hepatic lobe. Indwelling metallic internal biliary stent.  Ill-defined pancreatic tail mass measuring approximately 3.7 x 6.2 cm (series 2/ image 59), previously 5.0 x 6.6 cm. Enhancing soft tissue component extends inferiorly and involves the left kidney (series 2/image 53). Suspected involvement of the splenic hilum with heterogeneous perfusion of the spleen. Overall, this appearance is improved.  Gallbladder is grossly unremarkable. No intrahepatic or extrahepatic ductal dilatation.  Bilateral adrenal glands and right kidney are grossly unremarkable.  2.3 cm left para-aortic nodal metastasis (series 2/image 70), grossly unchanged.  Moderate abdominopelvic ascites, malignant. Associated omental soft tissue beneath the left anterior abdominal wall with discrete 10 x 12 mm implant (series 2/ image 75).  No evidence of bowel obstruction.  Mild atherosclerotic calcifications of the abdominal aorta.  Status  post hysterectomy.  Bilateral ovaries are unremarkable.  Bladder is underdistended.  Body wall edema.  Mild degenerative changes at L5-S1.  IMPRESSION: 6.2 cm ill-defined pancreatic tail mass, decreased. Involvement of the left kidney and spleen.  Multifocal hepatic metastases, mildly improved. 2.3 cm left para-aortic nodal metastasis, unchanged. Moderate abdominopelvic ascites, malignant, with associated peritoneal nodularity.  Prior medial right lower lobe metastasis has essentially resolved with residual scarring.  4.6 cm complex fluid density lesion in the anterior segment right hepatic lobe, new, worrisome for hepatic abscess.  These results were called by telephone at the time of interpretation on 11/03/2013 at 1:08 PM to [facility], who verbally acknowledged these results.   Electronically Signed   By: Charline Bills M.D.   On: 11/03/2013 13:10   Ct Abdomen Pelvis W Contrast  11/03/2013   CLINICAL DATA:  Metastatic pancreatic cancer, left lower quadrant pain  EXAM: CT CHEST, ABDOMEN, AND PELVIS WITH CONTRAST  TECHNIQUE: Multidetector CT imaging of the chest, abdomen and pelvis was performed following the standard protocol during bolus administration of intravenous contrast.  CONTRAST:  80mL OMNIPAQUE IOHEXOL 300 MG/ML  SOLN  COMPARISON:  CT abdomen pelvis dated 07/28/2013. CT chest dated 05/09/2013.  FINDINGS: CT CHEST FINDINGS  Mild linear scarring at the medial right lung base (series 3/image 50). Prior right lower lobe pulmonary nodule is no longer well visualized.  Linear scarring in the right middle lobe and lingula. Moderate centrilobular emphysematous changes, upper lobe predominant. Trace bilateral pleural effusions. No pneumothorax.  Left thyroid is mildly nodular/heterogeneous.  Mild cardiomegaly. Trace pericardial effusion. Mild atherosclerotic calcifications of the aortic arch.  Small thoracic lymph nodes which do not meet pathologic CT size criteria, including:  --6 mm short axis  prevascular node (series 2/image 21)  --7 mm short axis left axillary node (series 2/image 21)  --8 mm short axis subcarinal node (series 2/image 30)  Degenerative changes of the thoracic spine.  CT ABDOMEN AND PELVIS FINDINGS  Multifocal hepatic metastases, mildly improved, including:  --3.5 x 2.6 cm metastasis in the anterior segment right hepatic lobe (series 2/ image 49), previously 4.5 x 3.5 cm  --2.7 x 2.6 cm metastasis in the posterior segment right hepatic dome (series 2/ image 51), previously 3.3 x 4.0 cm  --2.4 x 2.5 cm metastasis  in the medial segment left hepatic lobe (series 2/ image 59), previously 3.5 x 3.8 cm.  Interval development of an irregular 4.6 x 4.4 cm multiloculated fluid density cystic lesion within the anterior segment right hepatic lobe and encroaching upon the gallbladder fossa (series 2/ images 67 and 70). This appearance is worrisome for a possible hepatic abscess.  Pneumobilia, predominantly within the left hepatic lobe. Indwelling metallic internal biliary stent.  Ill-defined pancreatic tail mass measuring approximately 3.7 x 6.2 cm (series 2/ image 59), previously 5.0 x 6.6 cm. Enhancing soft tissue component extends inferiorly and involves the left kidney (series 2/image 53). Suspected involvement of the splenic hilum with heterogeneous perfusion of the spleen. Overall, this appearance is improved.  Gallbladder is grossly unremarkable. No intrahepatic or extrahepatic ductal dilatation.  Bilateral adrenal glands and right kidney are grossly unremarkable.  2.3 cm left para-aortic nodal metastasis (series 2/image 70), grossly unchanged.  Moderate abdominopelvic ascites, malignant. Associated omental soft tissue beneath the left anterior abdominal wall with discrete 10 x 12 mm implant (series 2/ image 75).  No evidence of bowel obstruction.  Mild atherosclerotic calcifications of the abdominal aorta.  Status post hysterectomy.  Bilateral ovaries are unremarkable.  Bladder is  underdistended.  Body wall edema.  Mild degenerative changes at L5-S1.  IMPRESSION: 6.2 cm ill-defined pancreatic tail mass, decreased. Involvement of the left kidney and spleen.  Multifocal hepatic metastases, mildly improved. 2.3 cm left para-aortic nodal metastasis, unchanged. Moderate abdominopelvic ascites, malignant, with associated peritoneal nodularity.  Prior medial right lower lobe metastasis has essentially resolved with residual scarring.  4.6 cm complex fluid density lesion in the anterior segment right hepatic lobe, new, worrisome for hepatic abscess.  These results were called by telephone at the time of interpretation on 11/03/2013 at 1:08 PM to [facility], who verbally acknowledged these results.   Electronically Signed   By: Charline Bills M.D.   On: 11/03/2013 13:10    EKG:   Assessment/Plan Principal Problem:   Liver abscess: In the setting of pancreatic cancer with metastases to the liver. Will admit to medical bed at [facility] hospital.. Dr. Grace Isaac with interventional radiology has agreed to see patient. Percutaneous drainage of abscess scheduled for 11/05/2013. Of note PT/INR and APTT have been drawn and results are pending. N.p.o. past midnight in anticipation of procedure. Patient currently nontoxic appearing. Vital signs are stable. She received one dose of Rocephin in the cancer clinic prior to admission we'll continue this on admission. Will get blood cultures x2 noting that patient has been on antibiotics for at least the last 7 days. See HPI for other micro details. Will request infectious disease consult as well. Active Problems:    HTN (hypertension): Somewhat high side of normal. Slightly tachycardic. Continue home Norvasc. Currently blood pressure stable    Malignant neoplasm of pancreas, part unspecified: History of stage IV pancreatic cancer. In the therapy on a weekly basis. Repeat staging scan done this week shows positive response to chemotherapy. Currently with  ongoing chemotherapy. Last chemotherapy on 10/20/2013.    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 She is on Aranesp. Last dose given 10/27/2013    Fever: Likely related to #1. Of note recent hospitalization patient found to have bacteremia and Escherichia coli from right antecubital and Escherichia coli and enterococcus grew from Port-A-Cath. Repeat blood cultures. Will provide Tylenol if her temp is 101.5 or greater. Will request infectious disease consult. Continue the Rocephin until evaluation by ID  Diabetes. She reports appetite waxes and  wanes. Will continue her home Lantus at a lower dose. Will use sliding scale insulin for optimal glycemic control. Will provide car modified diet.     Infectious Disease: Dr Ninetta Lights  IR Dr Grace Isaac   Code Status: full Family Communication: daughter at bedside Disposition Plan: home when ready  Time spent: 77 minutes  Gwenyth Bender [facility] Pager [phone]  If 7PM-7AM, please contact night-coverage [email or web address] Password [facility] 11/04/2013, 2:35 PM   Attending note:  Patient seen and examined. Above note reviewed and amended.  Patient appears to be doing quite well clinically.  She was found to have a liver abscess on CT imaging.  On her recent hospitalization, she was found to have E coli in blood cultures as well as enterococcus.  Stool was positive for Shigela. She will need inpatient admission for drainage of hepatic abscess and infectious disease input.  Will plan on admitting to [facility] hospital to have this done. Dr. York Spaniel has accepted the patient in transfer.  Will also order basic labs like cbc and bmet, repeat blood cultures.  Fortune Brannigan

## 2013-11-04 NOTE — Progress Notes (Signed)
Tolerated Rocephin 2 gms well.  Port left accessed and dressed. To go by family driven automobile to [facility] for further evaluation.  Room assignment is 6 [facility] Room 31.

## 2013-11-04 NOTE — Progress Notes (Signed)
ANTIBIOTIC CONSULT NOTE - INITIAL  Pharmacy Consult for ceftriaxone Indication: lever abscess, shigella stool, bacteremia   No Known Allergies  Patient Measurements: Height: 5\' 5"  (165.1 cm) Weight: 166 lb 9.6 oz (75.569 kg) IBW/kg (Calculated) : 57   Vital Signs: Temp: 98.8 F (37.1 C) (12/09 1553) Temp src: Oral (12/09 1553) BP: 149/86 mmHg (12/09 1553) Pulse Rate: 108 (12/09 1553) Intake/Output from previous day:   Intake/Output from this shift:    Labs:  Recent Labs  11/04/13 1350  WBC 17.4*  HGB 7.3*  PLT 412*  CREATININE 1.18*   Estimated Creatinine Clearance: 48.3 ml/min (by C-G formula based on Cr of 1.18). No results found for this basename: VANCOTROUGH, Leodis Binet, VANCORANDOM, GENTTROUGH, GENTPEAK, GENTRANDOM, TOBRATROUGH, TOBRAPEAK, TOBRARND, AMIKACINPEAK, AMIKACINTROU, AMIKACIN,  in the last 72 hours   Microbiology: Recent Results (from the past 720 hour(s))  CULTURE, BLOOD (ROUTINE X 2)     Status: None   Collection Time    10/29/13  3:33 PM      Result Value Range Status   Specimen Description BLOOD PORTA CATH DRAWN BY RN GM   Final   Special Requests BOTTLES DRAWN AEROBIC AND ANAEROBIC [facility]   Final   Culture  Setup Time     Final   Value: 10/30/2013 16:31     Performed at [facility]   Culture     Final   Value: ESCHERICHIA COLI     ENTEROCOCCUS SPECIES     4 Note: Gram Stain Report Called to,Read Back By and Verified With: Edgar Frisk RN 12 14 850P EDMOJ Gram Stain Report Called to,Read Back By and Verified With: Edgar Frisk RN 12 4 14  900P EDMOJ     Performed at [facility]   Report Status PENDING   Incomplete   Organism ID, Bacteria ESCHERICHIA COLI   Final  CULTURE, BLOOD (ROUTINE X 2)     Status: None   Collection Time    10/29/13  3:45 PM      Result Value Range Status   Specimen Description BLOOD RIGHT ANTECUBITAL   Final   Special Requests BOTTLES DRAWN AEROBIC AND ANAEROBIC 16CC   Final   Culture  Setup Time      Final   Value: 10/30/2013 13:41     Performed at [facility]   Culture     Final   Value: ESCHERICHIA COLI     Note: SUSCEPTIBILITIES PERFORMED ON PREVIOUS CULTURE WITHIN THE LAST 5 DAYS.     Note: Performed at [facility] Gram Stain Report Called to,Read Back By and Verified With: Lenward Chancellor RN ON 10/30/13 @0815  BY RESSEGGER R     Performed at [facility]   Report Status 11/01/2013 FINAL   Final    Medical History: Past Medical History  Diagnosis Date  . Diabetes mellitus without complication   . Hypertension   . Anxiety   . Arthritis   . Pancreatic cancer   . 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 Peripheral neuropathy 10/13/2013  . Liver abscess 11/04/2013    Assessment: 67 YOF seen at [facility] cancer center and transferred to [facility] for ID consult. Patient was recently hospitalized at [facility] for bacteremia with E.Coli and enterococcus as well as Shigella in her stool culture. She was discharged on PO ciprofloxacin and Flagyl and repeat BCx was negative. She was then found to have a hepatic abscess with a percutaneous drainage scheduled for 12/10. She does have pancreatic cancer with  metastases. She is now to continue ceftriaxone. She did receive ceftriaxone 2g IV x1 at the cancer center today at 1310 per chart review. SCr 1.18 with est CrCl ~66mL/min.  Goal of Therapy:  eradication of infection  Plan: 1. Continue ceftriaxone at 2g IV q24h starting 12/10 at 1300 2. No adjustment is required for renal dysfunction 3. Pharmacy will continue to follow for clinical progression and ID recommendations.   Analysa Nutting D. Gari Trovato, PharmD, BCPS Clinical Pharmacist Pager: [phone] 11/04/2013 4:09 PM

## 2013-11-05 ENCOUNTER — Ambulatory Visit (HOSPITAL_COMMUNITY): Payer: Medicare Other

## 2013-11-05 ENCOUNTER — Ambulatory Visit (HOSPITAL_COMMUNITY)
Admission: RE | Admit: 2013-11-05 | Discharge: 2013-11-05 | Disposition: A | Discharge status: Home / Self Care | Payer: Medicare Other | Source: Ambulatory Visit | Attending: Oncology | Admitting: Oncology

## 2013-11-05 VITALS — BP 142/79 | HR 85 | Resp 20

## 2013-11-05 DIAGNOSIS — C259 Malignant neoplasm of pancreas, unspecified: Secondary | ICD-10-CM

## 2013-11-05 DIAGNOSIS — C7800 Secondary malignant neoplasm of unspecified lung: Secondary | ICD-10-CM

## 2013-11-05 DIAGNOSIS — C787 Secondary malignant neoplasm of liver and intrahepatic bile duct: Secondary | ICD-10-CM

## 2013-11-05 DIAGNOSIS — K750 Abscess of liver: Secondary | ICD-10-CM

## 2013-11-05 LAB — BASIC METABOLIC PANEL
CO2: 25 mEq/L (ref 19–32)
Glucose, Bld: 173 mg/dL — ABNORMAL HIGH (ref 70–99)
Potassium: 3.4 mEq/L — ABNORMAL LOW (ref 3.5–5.1)
Sodium: 137 mEq/L (ref 135–145)

## 2013-11-05 LAB — GLUCOSE, CAPILLARY
Glucose-Capillary: 102 mg/dL — ABNORMAL HIGH (ref 70–99)
Glucose-Capillary: 58 mg/dL — ABNORMAL LOW (ref 70–99)
Glucose-Capillary: 164 mg/dL — ABNORMAL HIGH (ref 70–99)
Glucose-Capillary: 125 mg/dL — ABNORMAL HIGH (ref 70–99)
Glucose-Capillary: 81 mg/dL (ref 70–99)

## 2013-11-05 LAB — CBC
Hemoglobin: 7.2 g/dL — ABNORMAL LOW (ref 12.0–15.0)
MCH: 26.5 pg (ref 26.0–34.0)
MCV: 83.5 fL (ref 78.0–100.0)
Platelets: 436 10*3/uL — ABNORMAL HIGH (ref 150–400)
RBC: 2.72 MIL/uL — ABNORMAL LOW (ref 3.87–5.11)
WBC: 16.1 10*3/uL — ABNORMAL HIGH (ref 4.0–10.5)

## 2013-11-05 MED ORDER — MIDAZOLAM HCL 2 MG/2ML IJ SOLN
INTRAMUSCULAR | Status: AC | PRN
  Administered 2013-11-05: 1 mg via INTRAVENOUS

## 2013-11-05 MED ORDER — LIDOCAINE HCL 1 % IJ SOLN
INTRAMUSCULAR | Status: AC
  Filled 2013-11-05: qty 10

## 2013-11-05 MED ORDER — PIPERACILLIN-TAZOBACTAM 3.375 G IVPB
3.3750 g | Freq: Three times a day (TID) | INTRAVENOUS | Status: DC
  Administered 2013-11-05 – 2013-11-13 (×23): 3.375 g via INTRAVENOUS
  Filled 2013-11-05 (×27): qty 50

## 2013-11-05 MED ORDER — FENTANYL CITRATE 0.05 MG/ML IJ SOLN
INTRAMUSCULAR | Status: AC
  Filled 2013-11-05: qty 4

## 2013-11-05 MED ORDER — FENTANYL CITRATE 0.05 MG/ML IJ SOLN
INTRAMUSCULAR | Status: AC | PRN
  Administered 2013-11-05: 25 ug via INTRAVENOUS

## 2013-11-05 MED ORDER — POTASSIUM CHLORIDE CRYS ER 20 MEQ PO TBCR
40.0000 meq | EXTENDED_RELEASE_TABLET | Freq: Every day | ORAL | Status: DC
  Administered 2013-11-06 – 2013-11-10 (×5): 40 meq via ORAL
  Administered 2013-11-11: 20 meq via ORAL
  Administered 2013-11-12 – 2013-11-13 (×2): 40 meq via ORAL
  Filled 2013-11-05 (×10): qty 2

## 2013-11-05 MED ORDER — MIDAZOLAM HCL 2 MG/2ML IJ SOLN
INTRAMUSCULAR | Status: AC
  Filled 2013-11-05: qty 4

## 2013-11-05 NOTE — Progress Notes (Signed)
ANTIBIOTIC CONSULT NOTE - FOLLOW UP  Pharmacy Consult for zosyn Indication: liver abscess and E-coli / enterococcus bacteremia  No Known Allergies  Patient Measurements: Height: 5\' 5"  (165.1 cm) Weight: 169 lb 1.5 oz (76.7 kg) IBW/kg (Calculated) : 57 Adjusted Body Weight:   Vital Signs: Temp: 98.2 F (36.8 C) (12/10 1327) Temp src: Oral (12/10 1327) BP: 140/72 mmHg (12/10 1327) Pulse Rate: 82 (12/10 1327) Intake/Output from previous day:   Intake/Output from this shift: Total I/O In: 0  Out: 845 [Urine:550; Drains:295]  Labs:  Recent Labs  11/04/13 1350 11/04/13 1630 11/05/13 0500  WBC 17.4* 20.4* 16.1*  HGB 7.3* 8.1* 7.2*  PLT 412* 421* 436*  CREATININE 1.18* 1.06 1.10   Estimated Creatinine Clearance: 52.2 ml/min (by C-G formula based on Cr of 1.1). No results found for this basename: VANCOTROUGH, Leodis Binet, VANCORANDOM, GENTTROUGH, GENTPEAK, GENTRANDOM, TOBRATROUGH, TOBRAPEAK, TOBRARND, AMIKACINPEAK, AMIKACINTROU, AMIKACIN,  in the last 72 hours   Microbiology: Recent Results (from the past 720 hour(s))  CULTURE, BLOOD (ROUTINE X 2)     Status: None   Collection Time    10/29/13  3:33 PM      Result Value Range Status   Specimen Description BLOOD PORTA CATH DRAWN BY RN GM   Final   Special Requests BOTTLES DRAWN AEROBIC AND ANAEROBIC [facility]   Final   Culture  Setup Time     Final   Value: 10/30/2013 16:31     Performed at [facility]   Culture     Final   Value: ESCHERICHIA COLI     ENTEROCOCCUS SPECIES     4 Note: Gram Stain Report Called to,Read Back By and Verified With: Edgar Frisk RN 12 14 850P EDMOJ Gram Stain Report Called to,Read Back By and Verified With: Edgar Frisk RN 12 4 14  900P EDMOJ     Performed at [facility]   Report Status PENDING   Incomplete   Organism ID, Bacteria ESCHERICHIA COLI   Final  CULTURE, BLOOD (ROUTINE X 2)     Status: None   Collection Time    10/29/13  3:45 PM      Result Value Range Status   Specimen Description BLOOD RIGHT ANTECUBITAL   Final   Special Requests BOTTLES DRAWN AEROBIC AND ANAEROBIC 16CC   Final   Culture  Setup Time     Final   Value: 10/30/2013 13:41     Performed at [facility]   Culture     Final   Value: ESCHERICHIA COLI     Note: SUSCEPTIBILITIES PERFORMED ON PREVIOUS CULTURE WITHIN THE LAST 5 DAYS.     Note: Performed at [facility] Gram Stain Report Called to,Read Back By and Verified With: Lenward Chancellor RN ON 10/30/13 @0815  BY RESSEGGER R     Performed at [facility]   Report Status 11/01/2013 FINAL   Final    Anti-infectives   Start     Dose/Rate Route Frequency Ordered Stop   11/05/13 1300  cefTRIAXone (ROCEPHIN) 2 g in dextrose 5 % 50 mL IVPB  Status:  Discontinued     2 g 100 mL/hr over 30 Minutes Intravenous Every 24 hours 11/04/13 1609 11/05/13 1647      Assessment: 67 yo female with liver abscess and E-coli / enterococcus bacteremia will be switched from ceftriaxone to zosyn.  CrCl ~52.  Plan:  1) Zosyn 3.375g iv q8h (4hr infusion) 2) F/u with plan on antibiotic.  Yang Rack, Tsz-Yin 11/05/2013,4:51 PM

## 2013-11-05 NOTE — Progress Notes (Addendum)
CRITICAL VALUE ALERT  Critical value received: cbg 58(Patient is asymptomatic)  Date of notification:  11/05/13  Time of notification:  1205    Nurse who received alert: Z Gottfried Standish  MD notified (1st page): Sullivan/pager Time of first page:  1215  MD notified (2nd page):  Time of second page:  Responding MD:  awaiting  Time MD responded:  N/A

## 2013-11-05 NOTE — Progress Notes (Addendum)
Chart reviewed.  Assessment/Plan:  Principal Problem:   Liver abscess with E coli and enteroccccal bacteremia: s/p perc drain. I don't see sensitivities for enterococcus.  Await liver abscess culture. On rocephin currently.  ID eval pending.  D/w Dr. Ninetta Lights. Active Problems:   HTN (hypertension)   Malignant neoplasm of pancreas, part unspecified   Antineoplastic chemotherapy induced anemia Hypokalemia, mild. Replete DM with hypoglycemia this am while NPO. monitor  Subjective: Some soreness, but no other complaints. No diarrhea  Objective: Vital signs in last 24 hours: Temp:  [98.4 F (36.9 C)-99.2 F (37.3 C)] 98.4 F (36.9 C) (12/10 1052) Pulse Rate:  [85-108] 95 (12/10 1052) Resp:  [12-20] 16 (12/10 1052) BP: (129-151)/(66-86) 146/82 mmHg (12/10 1052) SpO2:  [93 %-100 %] 96 % (12/10 1052) Weight:  [75.569 kg (166 lb 9.6 oz)-76.7 kg (169 lb 1.5 oz)] 76.7 kg (169 lb 1.5 oz) (12/10 0530) Weight change:  Last BM Date: 11/04/13  Intake/Output from previous day:   Intake/Output this shift: Total I/O In: 0  Out: 845 [Urine:550; Drains:295]  Gen: nontoxic. Comfortable Lungs CTA without WRR CV RRR without MGR Abd soft. Drain with clear brown liquid and purulent "plug"  Lab Results:  Recent Labs  11/04/13 1630 11/05/13 0500  WBC 20.4* 16.1*  HGB 8.1* 7.2*  HCT 25.8* 22.7*  PLT 421* 436*   BMET  Recent Labs  11/04/13 1630 11/05/13 0500  NA 135 137  K 3.5 3.4*  CL 101 105  CO2 23 25  GLUCOSE 260* 173*  BUN 10 10  CREATININE 1.06 1.10  CALCIUM 8.3* 7.8*    Studies/Results: No results found.  Medications:  Scheduled Meds: . amLODipine  5 mg Oral Daily  . cefTRIAXone (ROCEPHIN)  IV  2 g Intravenous Q24H  . enoxaparin (LOVENOX) injection  40 mg Subcutaneous Q24H  . fentaNYL      . furosemide  40 mg Oral Daily  . insulin aspart  0-5 Units Subcutaneous QHS  . insulin aspart  0-9 Units Subcutaneous TID WC  . insulin glargine  20 Units Subcutaneous QHS   . insulin glargine  5 Units Subcutaneous QHS  . lidocaine      . losartan  100 mg Oral Daily  . midazolam      . pantoprazole  40 mg Oral Daily  . potassium chloride SA  20 mEq Oral Daily  . senna  1 tablet Oral Daily  . sodium chloride  10-40 mL Intracatheter Q12H  . sodium chloride  3 mL Intravenous Q12H   Continuous Infusions:  PRN Meds:.sodium chloride, acetaminophen, acetaminophen, ALPRAZolam, alum & mag hydroxide-simeth, HYDROcodone-acetaminophen, ondansetron (ZOFRAN) IV, ondansetron, sodium chloride, sodium chloride, traZODone   LOS: 1 day   Bonny Egger L 11/05/2013, 3:29 PM

## 2013-11-05 NOTE — Procedures (Signed)
Procedure:  CT guided drainage of hepatic abscess Findings:  Right inferior hepatic collection yielded purulent appearing fluid by aspiration via 18 G needle. 10 Fr pigtail drain placed and attached to suction bulb. Will follow output.

## 2013-11-05 NOTE — Consult Note (Signed)
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  Consider repeat CT in 5-7 days?  Comment- Guidelines rec checking amoeba serolgy with liver abscess. She seems to be low risk. Will need to repeat BCx to make sure she has not seeded her port, this would be unusual with E coli, possible with enterococcus.   Thank you so much for this interesting consult,   Johny Sax (pager) [phone] [email or web address]  [name] is an 67 y.o. female.  HPI: 67 yo F with hx of DM2 (04-2013), pancreatic CA (Dx 04-2013, last CTX 11-24) with liver and lung mets. Was adm to [facility] 10-29-13 with fevers, chills, and rigors. When in ED was found to have temp 105.9. She was started on vanco/zosyn. She had BCx+ for E coli (peripheral), and enterococcus and E coli (port), and was prepared for d/c home on 12-7 (on po cipro). At home she had further fevers since d/c. She had CT abd on 12-8 and was found to have slightly decreased cancer lesions but a new "4.6 cm complex fluid density lesion in the anterior segment right hepatic lobe, worrisome for hepatic abscess." She was re-admitted to the hospital to have a drain placed, which was done 12-10 via IR.  She has been started on ceftriaxone since admission.    Past Medical History  Diagnosis Date  . Diabetes mellitus without complication   . Hypertension   . Anxiety   . Arthritis   . Pancreatic cancer   . 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 Peripheral neuropathy 10/13/2013  . Liver abscess 11/04/2013    Past Surgical  History  Procedure Laterality Date  . Abdominal hysterectomy    . Colonoscopy  2006    Dr. Jena Gauss, polyp, benign  . Ercp  05/10/2013    Procedure: ENDOSCOPIC RETROGRADE CHOLANGIOPANCREATOGRAPHY (ERCP)(DIFFICULT CANNULATION) ;  Surgeon: Malissa Hippo, MD;  Location: [facility];  Service: Endoscopy;;  . Biliary stent placement  05/10/2013    Procedure: BILIARY STENT PLACEMENT ( X WALLFLEX STENT);  Surgeon: Malissa Hippo, MD;  Location: [facility];  Service: Endoscopy;;  . Sphincterotomy  05/10/2013    Procedure: SPHINCTEROTOMY (PRECUT MADE WITH NEEDLE KNIFE);  Surgeon: Malissa Hippo, MD;  Location: [facility];  Service: Endoscopy;;  . Liver biopsy  05/12/2013  . Portacath placement Left 05/23/2013    Procedure: INSERTION PORT-A-CATH;  Surgeon: Fabio Bering, MD;  Location: [facility];  Service: General;  Laterality: Left;     No Known Allergies  Medications:  Scheduled: . amLODipine  5 mg Oral Daily  . cefTRIAXone (ROCEPHIN)  IV  2 g Intravenous Q24H  . enoxaparin (LOVENOX) injection  40 mg Subcutaneous Q24H  . fentaNYL      . furosemide  40 mg Oral Daily  . insulin aspart  0-9 Units Subcutaneous TID WC  . insulin glargine  20 Units Subcutaneous QHS  . lidocaine      . losartan  100 mg Oral Daily  . midazolam      . pantoprazole  40 mg Oral Daily  . [START ON 11/06/2013] potassium chloride SA  40  mEq Oral Daily  . senna  1 tablet Oral Daily  . sodium chloride  10-40 mL Intracatheter Q12H  . sodium chloride  3 mL Intravenous Q12H    Total days of antibiotics: 7 (Ceftriaxone)          Social History:  reports that she has quit smoking. She quit smokeless tobacco use about 7 months ago. She reports that she does not drink alcohol or use illicit drugs.  Family History  Problem Relation Age of Onset  . Colon cancer Neg Hx   . Liver disease Neg Hx   . Bone cancer Brother   . Cancer Brother   . Hypertension Mother     General ROS: no dysphagia, no cough, no sob, no problems with  port, no dirrhea, no dysuria, +LE edema, fever resolved, see HPI.   Blood pressure 146/82, pulse 95, temperature 98.4 F (36.9 C), temperature source Oral, resp. rate 16, height 5\' 5"  (1.651 m), weight 76.7 kg (169 lb 1.5 oz), SpO2 96.00%. General appearance: alert, cooperative and no distress Eyes: negative findings: pupils equal, round, reactive to light and accomodation Throat: normal findings: oropharynx pink & moist without lesions or evidence of thrush Lungs: clear to auscultation bilaterally and L anterior chest port- nontender. no erythema or fluctuance.  Heart: regular rate and rhythm Abdomen: normal findings: bowel sounds normal and soft, non-tender and R mid abd drain. mild tenderness at insertion site.  Extremities: edema 3+ to knees   Results for orders placed during the hospital encounter of 11/04/13 (from the past 48 hour(s))  COMPREHENSIVE METABOLIC PANEL     Status: Abnormal   Collection Time    11/04/13  4:30 PM      Result Value Range   Sodium 135  135 - 145 mEq/L   Potassium 3.5  3.5 - 5.1 mEq/L   Chloride 101  96 - 112 mEq/L   CO2 23  19 - 32 mEq/L   Glucose, Bld 260 (*) 70 - 99 mg/dL   BUN 10  6 - 23 mg/dL   Creatinine, Ser 4.09  0.50 - 1.10 mg/dL   Calcium 8.3 (*) 8.4 - 10.5 mg/dL   Total Protein 7.3  6.0 - 8.3 g/dL   Albumin 2.0 (*) 3.5 - 5.2 g/dL   AST 12  0 - 37 U/L   ALT 10  0 - 35 U/L   Alkaline Phosphatase 123 (*) 39 - 117 U/L   Total Bilirubin 0.3  0.3 - 1.2 mg/dL   GFR calc non Af Amer 54 (*) >90 mL/min   GFR calc Af Amer 62 (*) >90 mL/min   Comment: (NOTE)     The eGFR has been calculated using the CKD EPI equation.     This calculation has not been validated in all clinical situations.     eGFR's persistently <90 mL/min signify possible Chronic Kidney     Disease.  CBC WITH DIFFERENTIAL     Status: Abnormal   Collection Time    11/04/13  4:30 PM      Result Value Range   WBC 20.4 (*) 4.0 - 10.5 K/uL   RBC 3.11 (*) 3.87 - 5.11 MIL/uL    Hemoglobin 8.1 (*) 12.0 - 15.0 g/dL   HCT 81.1 (*) 91.4 - 78.2 %   MCV 83.0  78.0 - 100.0 fL   MCH 26.0  26.0 - 34.0 pg   MCHC 31.4  30.0 - 36.0 g/dL   RDW 95.6 (*) 21.3 - 08.6 %  Platelets 421 (*) 150 - 400 K/uL   Neutrophils Relative % 71  43 - 77 %   Lymphocytes Relative 12  12 - 46 %   Monocytes Relative 16 (*) 3 - 12 %   Eosinophils Relative 1  0 - 5 %   Basophils Relative 0  0 - 1 %   Neutro Abs 14.5 (*) 1.7 - 7.7 K/uL   Lymphs Abs 2.4  0.7 - 4.0 K/uL   Monocytes Absolute 3.3 (*) 0.1 - 1.0 K/uL   Eosinophils Absolute 0.2  0.0 - 0.7 K/uL   Basophils Absolute 0.0  0.0 - 0.1 K/uL   RBC Morphology POLYCHROMASIA PRESENT     WBC Morphology ATYPICAL LYMPHOCYTES    GLUCOSE, CAPILLARY     Status: Abnormal   Collection Time    11/04/13  5:03 PM      Result Value Range   Glucose-Capillary 227 (*) 70 - 99 mg/dL  GLUCOSE, CAPILLARY     Status: Abnormal   Collection Time    11/04/13 10:04 PM      Result Value Range   Glucose-Capillary 271 (*) 70 - 99 mg/dL   Comment 1 Notify RN    BASIC METABOLIC PANEL     Status: Abnormal   Collection Time    11/05/13  5:00 AM      Result Value Range   Sodium 137  135 - 145 mEq/L   Potassium 3.4 (*) 3.5 - 5.1 mEq/L   Chloride 105  96 - 112 mEq/L   CO2 25  19 - 32 mEq/L   Glucose, Bld 173 (*) 70 - 99 mg/dL   BUN 10  6 - 23 mg/dL   Creatinine, Ser 4.78  0.50 - 1.10 mg/dL   Calcium 7.8 (*) 8.4 - 10.5 mg/dL   GFR calc non Af Amer 52 (*) >90 mL/min   GFR calc Af Amer 60 (*) >90 mL/min   Comment: (NOTE)     The eGFR has been calculated using the CKD EPI equation.     This calculation has not been validated in all clinical situations.     eGFR's persistently <90 mL/min signify possible Chronic Kidney     Disease.  CBC     Status: Abnormal   Collection Time    11/05/13  5:00 AM      Result Value Range   WBC 16.1 (*) 4.0 - 10.5 K/uL   RBC 2.72 (*) 3.87 - 5.11 MIL/uL   Hemoglobin 7.2 (*) 12.0 - 15.0 g/dL   HCT 29.5 (*) 62.1 - 30.8 %   MCV  83.5  78.0 - 100.0 fL   MCH 26.5  26.0 - 34.0 pg   MCHC 31.7  30.0 - 36.0 g/dL   RDW 65.7 (*) 84.6 - 96.2 %   Platelets 436 (*) 150 - 400 K/uL  GLUCOSE, CAPILLARY     Status: Abnormal   Collection Time    11/05/13  7:47 AM      Result Value Range   Glucose-Capillary 102 (*) 70 - 99 mg/dL  GLUCOSE, CAPILLARY     Status: Abnormal   Collection Time    11/05/13 12:04 PM      Result Value Range   Glucose-Capillary 58 (*) 70 - 99 mg/dL  GLUCOSE, CAPILLARY     Status: Abnormal   Collection Time    11/05/13  2:15 PM      Result Value Range   Glucose-Capillary 164 (*) 70 - 99 mg/dL  Component Value Date/Time   SDES BLOOD RIGHT ANTECUBITAL 10/29/2013 1545   SPECREQUEST BOTTLES DRAWN AEROBIC AND ANAEROBIC 16CC 10/29/2013 1545   CULT  Value: ESCHERICHIA COLI Note: SUSCEPTIBILITIES PERFORMED ON PREVIOUS CULTURE WITHIN THE LAST 5 DAYS. Note: Performed at [facility] Gram Stain Report Called to,Read Back By and Verified With: VALDISE DILDY RN ON 10/30/13 @0815  BY RESSEGGER R Performed at [facility] 10/29/2013 1545   REPTSTATUS 11/01/2013 FINAL 10/29/2013 1545   Ct Image Guided Drainage Percut Cath  Peritoneal Retroperit  11/05/2013   CLINICAL DATA:  History of metastatic pancreatic carcinoma to the liver. There is clinical evidence of infection with CT demonstrating a new liquefied area in the inferior liver suspicious for hepatic abscess. The patient presents for needle aspiration with possible drainage catheter placement.  EXAM: CT GUIDED DRAINAGE OF HEPATIC ABSCESS  ANESTHESIA/SEDATION: 1.0 Mg IV Versed 25 mcg IV Fentanyl  Total Moderate Sedation Time:  23 minutes  PROCEDURE: The procedure, risks, benefits, and alternatives were explained to the patient. Questions regarding the procedure were encouraged and answered. The patient understands and consents to the procedure.  The anterior right abdominal wall was prepped with Betadinein a sterile fashion, and a sterile drape was  applied covering the operative field. A sterile gown and sterile gloves were used for the procedure. Local anesthesia was provided with 1% Lidocaine.  CT was performed of the liver in a supine position. After localizing the abnormal fluid collection in the inferior right lobe, an 18 gauge needle was advanced under CT guidance into the collection. Fluid aspiration was performed. A sample was sent for culture analysis.  A guidewire was advanced through the needle. The tract was dilated and a 10 French percutaneous drain advanced into the liver. Catheter positioning was confirmed by CT. The catheter was flushed and connected to a suction bulb. The catheter was secured at the skin with a Prolene retention suture and StatLock device.  COMPLICATIONS: None  FINDINGS: Aspiration at the level of the more liquefied collection in the inferior right lobe of the liver yielded grossly purulent fluid. A percutaneous drain was formed in the collection and is draining well after placement. Output will be followed.  IMPRESSION: CT-guided drainage of hepatic abscess in the inferior right lobe. A 10 French drainage catheter was placed and attached to a suction bulb.   Electronically Signed   By: Irish Lack M.D.   On: 11/05/2013 15:31   Recent Results (from the past 240 hour(s))  CULTURE, BLOOD (ROUTINE X 2)     Status: None   Collection Time    10/29/13  3:33 PM      Result Value Range Status   Specimen Description BLOOD PORTA CATH DRAWN BY RN GM   Final   Special Requests BOTTLES DRAWN AEROBIC AND ANAEROBIC [facility]   Final   Culture  Setup Time     Final   Value: 10/30/2013 16:31     Performed at [facility]   Culture     Final   Value: ESCHERICHIA COLI     ENTEROCOCCUS SPECIES     4 Note: Gram Stain Report Called to,Read Back By and Verified With: Edgar Frisk RN 12 14 850P EDMOJ Gram Stain Report Called to,Read Back By and Verified With: Edgar Frisk RN 12 4 14  900P EDMOJ     Performed at [facility]   Report Status PENDING   Incomplete   Organism ID, Bacteria ESCHERICHIA COLI   Final  CULTURE,  BLOOD (ROUTINE X 2)     Status: None   Collection Time    10/29/13  3:45 PM      Result Value Range Status   Specimen Description BLOOD RIGHT ANTECUBITAL   Final   Special Requests BOTTLES DRAWN AEROBIC AND ANAEROBIC 16CC   Final   Culture  Setup Time     Final   Value: 10/30/2013 13:41     Performed at [facility]   Culture     Final   Value: ESCHERICHIA COLI     Note: SUSCEPTIBILITIES PERFORMED ON PREVIOUS CULTURE WITHIN THE LAST 5 DAYS.     Note: Performed at [facility] Gram Stain Report Called to,Read Back By and Verified With: Lenward Chancellor RN ON 10/30/13 @0815  BY RESSEGGER R     Performed at [facility]   Report Status 11/01/2013 FINAL   Final      11/05/2013, 4:08 PM     LOS: 1 day

## 2013-11-06 ENCOUNTER — Ambulatory Visit (HOSPITAL_COMMUNITY): Payer: Medicare Other

## 2013-11-06 DIAGNOSIS — A498 Other bacterial infections of unspecified site: Secondary | ICD-10-CM

## 2013-11-06 DIAGNOSIS — R7881 Bacteremia: Secondary | ICD-10-CM

## 2013-11-06 LAB — CULTURE, BLOOD (ROUTINE X 2)

## 2013-11-06 LAB — GLUCOSE, CAPILLARY
Glucose-Capillary: 73 mg/dL (ref 70–99)
Glucose-Capillary: 94 mg/dL (ref 70–99)

## 2013-11-06 LAB — HIV ANTIBODY (ROUTINE TESTING W REFLEX): HIV: NONREACTIVE

## 2013-11-06 NOTE — Progress Notes (Signed)
INFECTIOUS DISEASE PROGRESS NOTE  ID: [name] is a 67 y.o. female with  Principal Problem:   Liver abscess Active Problems:   HTN (hypertension)   Malignant neoplasm of pancreas, part unspecified   Antineoplastic chemotherapy induced anemia(285.3)   Fever   Hepatic abscess  Subjective: Without complaints  Abtx:  Anti-infectives   Start     Dose/Rate Route Frequency Ordered Stop   11/05/13 1800  piperacillin-tazobactam (ZOSYN) IVPB 3.375 g     3.375 g 12.5 mL/hr over 240 Minutes Intravenous Every 8 hours 11/05/13 1654     11/05/13 1300  cefTRIAXone (ROCEPHIN) 2 g in dextrose 5 % 50 mL IVPB  Status:  Discontinued     2 g 100 mL/hr over 30 Minutes Intravenous Every 24 hours 11/04/13 1609 11/05/13 1647      Medications:  Scheduled: . amLODipine  5 mg Oral Daily  . enoxaparin (LOVENOX) injection  40 mg Subcutaneous Q24H  . furosemide  40 mg Oral Daily  . insulin aspart  0-9 Units Subcutaneous TID WC  . losartan  100 mg Oral Daily  . pantoprazole  40 mg Oral Daily  . piperacillin-tazobactam (ZOSYN)  IV  3.375 g Intravenous Q8H  . potassium chloride SA  40 mEq Oral Daily  . senna  1 tablet Oral Daily  . sodium chloride  10-40 mL Intracatheter Q12H  . sodium chloride  3 mL Intravenous Q12H    Objective: Vital signs in last 24 hours: Temp:  [98.1 F (36.7 C)-98.5 F (36.9 C)] 98.5 F (36.9 C) (12/11 1000) Pulse Rate:  [85-95] 86 (12/11 1000) Resp:  [16-18] 16 (12/11 1000) BP: (132-141)/(64-84) 132/64 mmHg (12/11 1000) SpO2:  [98 %-100 %] 100 % (12/11 1000) Weight:  [71.5 kg (157 lb 10.1 oz)] 71.5 kg (157 lb 10.1 oz) (12/11 0535) Drain out 340 yesterday  General appearance: alert, cooperative and no distress Resp: clear to auscultation bilaterally Chest wall: L sided port- nontender.  Cardio: regular rate and rhythm GI: normal findings: bowel sounds normal and soft, non-tender  Lab Results  Recent Labs  11/04/13 1630 11/05/13 0500  WBC 20.4* 16.1*    HGB 8.1* 7.2*  HCT 25.8* 22.7*  NA 135 137  K 3.5 3.4*  CL 101 105  CO2 23 25  BUN 10 10  CREATININE 1.06 1.10   Liver Panel  Recent Labs  11/04/13 1350 11/04/13 1630  PROT 6.9 7.3  ALBUMIN 1.8* 2.0*  AST 11 12  ALT 8 10  ALKPHOS 129* 123*  BILITOT 0.3 0.3   Sedimentation Rate No results found for this basename: ESRSEDRATE,  in the last 72 hours C-Reactive Protein No results found for this basename: CRP,  in the last 72 hours  Microbiology: Recent Results (from the past 240 hour(s))  CULTURE, BLOOD (ROUTINE X 2)     Status: None   Collection Time    10/29/13  3:33 PM      Result Value Range Status   Specimen Description BLOOD PORTA CATH DRAWN BY RN GM   Final   Special Requests BOTTLES DRAWN AEROBIC AND ANAEROBIC [facility]   Final   Culture  Setup Time     Final   Value: 10/30/2013 16:31     Performed at [facility]   Culture     Final   Value: ESCHERICHIA COLI     ENTEROCOCCUS SPECIES     4 Note: Gram Stain Report Called to,Read Back By and Verified With: Edgar Frisk RN 12  14 850P EDMOJ Gram Stain Report Called to,Read Back By and Verified With: Edgar Frisk RN 12 4 14  900P EDMOJ     Performed at [facility]   Report Status 11/06/2013 FINAL   Final   Organism ID, Bacteria ESCHERICHIA COLI   Final   Organism ID, Bacteria ENTEROCOCCUS SPECIES   Final  CULTURE, BLOOD (ROUTINE X 2)     Status: None   Collection Time    10/29/13  3:45 PM      Result Value Range Status   Specimen Description BLOOD RIGHT ANTECUBITAL   Final   Special Requests BOTTLES DRAWN AEROBIC AND ANAEROBIC 16CC   Final   Culture  Setup Time     Final   Value: 10/30/2013 13:41     Performed at [facility]   Culture     Final   Value: ESCHERICHIA COLI     Note: SUSCEPTIBILITIES PERFORMED ON PREVIOUS CULTURE WITHIN THE LAST 5 DAYS.     Note: Performed at [facility] Gram Stain Report Called to,Read Back By and Verified With: VALDISE DILDY RN ON 10/30/13 @0815  BY  RESSEGGER R     Performed at [facility]   Report Status 11/01/2013 FINAL   Final  CULTURE, BLOOD (ROUTINE X 2)     Status: None   Collection Time    11/04/13  5:07 PM      Result Value Range Status   Specimen Description BLOOD RIGHT ARM   Final   Special Requests BOTTLES DRAWN AEROBIC ONLY 4CC   Final   Culture  Setup Time     Final   Value: 11/05/2013 01:21     Performed at [facility]   Culture     Final   Value:        BLOOD CULTURE RECEIVED NO GROWTH TO DATE CULTURE WILL BE HELD FOR 5 DAYS BEFORE ISSUING A FINAL NEGATIVE REPORT     Performed at [facility]   Report Status PENDING   Incomplete  CULTURE, BLOOD (ROUTINE X 2)     Status: None   Collection Time    11/04/13  5:11 PM      Result Value Range Status   Specimen Description BLOOD LEFT ARM   Final   Special Requests BOTTLES DRAWN AEROBIC ONLY 3CC'   Final   Culture  Setup Time     Final   Value: 11/05/2013 00:39     Performed at [facility]   Culture     Final   Value:        BLOOD CULTURE RECEIVED NO GROWTH TO DATE CULTURE WILL BE HELD FOR 5 DAYS BEFORE ISSUING A FINAL NEGATIVE REPORT     Performed at [facility]   Report Status PENDING   Incomplete  CULTURE, ROUTINE-ABSCESS     Status: None   Collection Time    11/05/13 10:19 AM      Result Value Range Status   Specimen Description ABSCESS   Final   Special Requests HEPATIC ABSCESS    Final   Gram Stain PENDING   Incomplete   Culture     Final   Value: NO GROWTH     Performed at [facility]   Report Status PENDING   Incomplete  ANAEROBIC CULTURE     Status: None   Collection Time    11/05/13 10:19 AM      Result Value Range Status   Specimen Description ABSCESS  Final   Special Requests HEPATIC ABSCESS   Final   Gram Stain     Final   Value: ABUNDANT WBC PRESENT,BOTH PMN AND MONONUCLEAR     NO SQUAMOUS EPITHELIAL CELLS SEEN     FEW GRAM NEGATIVE RODS     Performed at [facility]   Culture      Final   Value: NO ANAEROBES ISOLATED; CULTURE IN PROGRESS FOR 5 DAYS     Performed at [facility]   Report Status PENDING   Incomplete    Studies/Results: Ct Image Guided Drainage Percut Cath  Peritoneal Retroperit  11/05/2013   CLINICAL DATA:  History of metastatic pancreatic carcinoma to the liver. There is clinical evidence of infection with CT demonstrating a new liquefied area in the inferior liver suspicious for hepatic abscess. The patient presents for needle aspiration with possible drainage catheter placement.  EXAM: CT GUIDED DRAINAGE OF HEPATIC ABSCESS  ANESTHESIA/SEDATION: 1.0 Mg IV Versed 25 mcg IV Fentanyl  Total Moderate Sedation Time:  23 minutes  PROCEDURE: The procedure, risks, benefits, and alternatives were explained to the patient. Questions regarding the procedure were encouraged and answered. The patient understands and consents to the procedure.  The anterior right abdominal wall was prepped with Betadinein a sterile fashion, and a sterile drape was applied covering the operative field. A sterile gown and sterile gloves were used for the procedure. Local anesthesia was provided with 1% Lidocaine.  CT was performed of the liver in a supine position. After localizing the abnormal fluid collection in the inferior right lobe, an 18 gauge needle was advanced under CT guidance into the collection. Fluid aspiration was performed. A sample was sent for culture analysis.  A guidewire was advanced through the needle. The tract was dilated and a 10 French percutaneous drain advanced into the liver. Catheter positioning was confirmed by CT. The catheter was flushed and connected to a suction bulb. The catheter was secured at the skin with a Prolene retention suture and StatLock device.  COMPLICATIONS: None  FINDINGS: Aspiration at the level of the more liquefied collection in the inferior right lobe of the liver yielded grossly purulent fluid. A percutaneous drain was formed in the  collection and is draining well after placement. Output will be followed.  IMPRESSION: CT-guided drainage of hepatic abscess in the inferior right lobe. A 10 French drainage catheter was placed and attached to a suction bulb.   Electronically Signed   By: Irish Lack M.D.   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  Wbc down  Afebrile.  Will continue to watch, f/u CT in future.          Johny Sax Infectious Diseases (pager) [phone] [email or web address] 11/06/2013, 2:02 PM  LOS: 2 days

## 2013-11-06 NOTE — Progress Notes (Signed)
Inpatient Diabetes Program Recommendations  AACE/ADA: New Consensus Statement on Inpatient Glycemic Control (2013)  Target Ranges:  Prepandial:   less than 140 mg/dL      Peak postprandial:   less than 180 mg/dL (1-2 hours)      Critically ill patients:  140 - 180 mg/dL   Results for APRILL, BANKO (MRN [id number]) as of 11/06/2013 13:30  Ref. Range 11/05/2013 07:47 11/05/2013 12:04 11/05/2013 14:15 11/05/2013 16:54 11/05/2013 21:49 11/06/2013 07:36 11/06/2013 11:44  Glucose-Capillary Latest Range: 70-99 mg/dL 841 (H) 58 (L) 324 (H) 125 (H) 81 73 106 (H)   Inpatient Diabetes Program Recommendations Insulin - Basal: Please consider decreasing Lantus to 5 units QHS and adjust accordingly.  Note:  Patient has a history of diabetes and takes Lantus 20 units QHS as an outpatient for diabetes management. Currently, patient is ordered to receive Lantus 20 units QHS and Novolog 0-9 units AC for inpatient glycemic control. Noted patient received Lantus 25 units on 12/9 @ 23:27 and was hypoglycemic on 12/10 @ 12:04 with glucose of 58 mg/dl.  In reviewing the chart, noted that patient did not receive any basal insulin last night.  At bedtime, CBG was 81 mg/dl and Lantus was charted as "Not Given, CBG 81 and patient did not want insulin".  Despite not receiving Lantus last night, fasting blood glucose was 73 mg/dl this morning.  Please consider decreasing Lantus to 5 units QHS and adjust accordingly.  Will continue to follow.  Thanks, Orlando Penner, RN, MSN, CCRN Diabetes Coordinator Inpatient Diabetes Program [phone] (Team Pager) [phone] ([facility] office) [phone] [facility] office)

## 2013-11-06 NOTE — Progress Notes (Signed)
Chart reviewed.  Assessment/Plan:  Principal Problem:   Liver abscess with E coli and enteroccccal bacteremia: s/p perc drain.  Await liver abscess culture. Now on zosyn per ID.  HIV NR Active Problems:   HTN (hypertension)   Malignant neoplasm of pancreas, part unspecified   Antineoplastic chemotherapy induced anemia Hypokalemia, mild.  DM 2: stable  Subjective: Continues to feel better daily.  Wants to walk around the unit. No N/V/D  Objective: Vital signs in last 24 hours: Temp:  [98.1 F (36.7 C)-98.5 F (36.9 C)] 98.5 F (36.9 C) (12/11 1000) Pulse Rate:  [82-95] 86 (12/11 1000) Resp:  [16-18] 16 (12/11 1000) BP: (132-141)/(64-84) 132/64 mmHg (12/11 1000) SpO2:  [98 %-100 %] 100 % (12/11 1000) Weight:  [71.5 kg (157 lb 10.1 oz)] 71.5 kg (157 lb 10.1 oz) (12/11 0535) Weight change: -4.069 kg (-8 lb 15.5 oz) Last BM Date: 11/04/13  Intake/Output from previous day: 12/10 0701 - 12/11 0700 In: 0  Out: 890 [Urine:550; Drains:340] Intake/Output this shift: Total I/O In: 240 [P.O.:240] Out: 50 [Drains:50]  Gen: nontoxic. Comfortable Lungs CTA without WRR CV RRR without MGR Abd soft. Drain with cloudy tan fluid  Lab Results:  Recent Labs  11/04/13 1630 11/05/13 0500  WBC 20.4* 16.1*  HGB 8.1* 7.2*  HCT 25.8* 22.7*  PLT 421* 436*   BMET  Recent Labs  11/04/13 1630 11/05/13 0500  NA 135 137  K 3.5 3.4*  CL 101 105  CO2 23 25  GLUCOSE 260* 173*  BUN 10 10  CREATININE 1.06 1.10  CALCIUM 8.3* 7.8*    Studies/Results: Ct Image Guided Drainage Percut Cath  Peritoneal Retroperit  11/05/2013   CLINICAL DATA:  History of metastatic pancreatic carcinoma to the liver. There is clinical evidence of infection with CT demonstrating a new liquefied area in the inferior liver suspicious for hepatic abscess. The patient presents for needle aspiration with possible drainage catheter placement.  EXAM: CT GUIDED DRAINAGE OF HEPATIC ABSCESS  ANESTHESIA/SEDATION: 1.0  Mg IV Versed 25 mcg IV Fentanyl  Total Moderate Sedation Time:  23 minutes  PROCEDURE: The procedure, risks, benefits, and alternatives were explained to the patient. Questions regarding the procedure were encouraged and answered. The patient understands and consents to the procedure.  The anterior right abdominal wall was prepped with Betadinein a sterile fashion, and a sterile drape was applied covering the operative field. A sterile gown and sterile gloves were used for the procedure. Local anesthesia was provided with 1% Lidocaine.  CT was performed of the liver in a supine position. After localizing the abnormal fluid collection in the inferior right lobe, an 18 gauge needle was advanced under CT guidance into the collection. Fluid aspiration was performed. A sample was sent for culture analysis.  A guidewire was advanced through the needle. The tract was dilated and a 10 French percutaneous drain advanced into the liver. Catheter positioning was confirmed by CT. The catheter was flushed and connected to a suction bulb. The catheter was secured at the skin with a Prolene retention suture and StatLock device.  COMPLICATIONS: None  FINDINGS: Aspiration at the level of the more liquefied collection in the inferior right lobe of the liver yielded grossly purulent fluid. A percutaneous drain was formed in the collection and is draining well after placement. Output will be followed.  IMPRESSION: CT-guided drainage of hepatic abscess in the inferior right lobe. A 10 French drainage catheter was placed and attached to a suction bulb.   Electronically Signed  By: Irish Lack M.D.   On: 11/05/2013 15:31    Medications:  Scheduled Meds: . amLODipine  5 mg Oral Daily  . enoxaparin (LOVENOX) injection  40 mg Subcutaneous Q24H  . furosemide  40 mg Oral Daily  . insulin aspart  0-9 Units Subcutaneous TID WC  . insulin glargine  20 Units Subcutaneous QHS  . losartan  100 mg Oral Daily  . pantoprazole  40 mg  Oral Daily  . piperacillin-tazobactam (ZOSYN)  IV  3.375 g Intravenous Q8H  . potassium chloride SA  40 mEq Oral Daily  . senna  1 tablet Oral Daily  . sodium chloride  10-40 mL Intracatheter Q12H  . sodium chloride  3 mL Intravenous Q12H   Continuous Infusions:  PRN Meds:.sodium chloride, acetaminophen, acetaminophen, ALPRAZolam, alum & mag hydroxide-simeth, HYDROcodone-acetaminophen, ondansetron (ZOFRAN) IV, ondansetron, sodium chloride, sodium chloride, traZODone   LOS: 2 days   Camber Ninh L 11/06/2013, 1:24 PM

## 2013-11-06 NOTE — Progress Notes (Signed)
Subjective: Hepatic abscess drain placed 12/10 340 cc output so far Feels better!!  Objective: Vital signs in last 24 hours: Temp:  [98.1 F (36.7 C)-98.4 F (36.9 C)] 98.3 F (36.8 C) (12/11 0535) Pulse Rate:  [82-95] 85 (12/11 0535) Resp:  [12-20] 18 (12/11 0535) BP: (138-151)/(72-84) 138/84 mmHg (12/11 0535) SpO2:  [96 %-100 %] 98 % (12/11 0535) Weight:  [157 lb 10.1 oz (71.5 kg)] 157 lb 10.1 oz (71.5 kg) (12/11 0535) Last BM Date: 11/04/13  Intake/Output from previous day: 12/10 0701 - 12/11 0700 In: 0  Out: 890 [Urine:550; Drains:340] Intake/Output this shift:    PE:  Afeb; vss Comfortable Drain intact Output 340 cc recorded 12/10 75 cc in JP now Cloudy brown fluid Cx pending Site clean dry; NT No wbc today  Lab Results:   Recent Labs  11/04/13 1630 11/05/13 0500  WBC 20.4* 16.1*  HGB 8.1* 7.2*  HCT 25.8* 22.7*  PLT 421* 436*   BMET  Recent Labs  11/04/13 1630 11/05/13 0500  NA 135 137  K 3.5 3.4*  CL 101 105  CO2 23 25  GLUCOSE 260* 173*  BUN 10 10  CREATININE 1.06 1.10  CALCIUM 8.3* 7.8*   PT/INR  Recent Labs  11/04/13 1305  LABPROT 15.2  INR 1.23   ABG No results found for this basename: PHART, PCO2, PO2, HCO3,  in the last 72 hours  Studies/Results: Ct Image Guided Drainage Percut Cath  Peritoneal Retroperit  11/05/2013   CLINICAL DATA:  History of metastatic pancreatic carcinoma to the liver. There is clinical evidence of infection with CT demonstrating a new liquefied area in the inferior liver suspicious for hepatic abscess. The patient presents for needle aspiration with possible drainage catheter placement.  EXAM: CT GUIDED DRAINAGE OF HEPATIC ABSCESS  ANESTHESIA/SEDATION: 1.0 Mg IV Versed 25 mcg IV Fentanyl  Total Moderate Sedation Time:  23 minutes  PROCEDURE: The procedure, risks, benefits, and alternatives were explained to the patient. Questions regarding the procedure were encouraged and answered. The patient  understands and consents to the procedure.  The anterior right abdominal wall was prepped with Betadinein a sterile fashion, and a sterile drape was applied covering the operative field. A sterile gown and sterile gloves were used for the procedure. Local anesthesia was provided with 1% Lidocaine.  CT was performed of the liver in a supine position. After localizing the abnormal fluid collection in the inferior right lobe, an 18 gauge needle was advanced under CT guidance into the collection. Fluid aspiration was performed. A sample was sent for culture analysis.  A guidewire was advanced through the needle. The tract was dilated and a 10 French percutaneous drain advanced into the liver. Catheter positioning was confirmed by CT. The catheter was flushed and connected to a suction bulb. The catheter was secured at the skin with a Prolene retention suture and StatLock device.  COMPLICATIONS: None  FINDINGS: Aspiration at the level of the more liquefied collection in the inferior right lobe of the liver yielded grossly purulent fluid. A percutaneous drain was formed in the collection and is draining well after placement. Output will be followed.  IMPRESSION: CT-guided drainage of hepatic abscess in the inferior right lobe. A 10 French drainage catheter was placed and attached to a suction bulb.   Electronically Signed   By: Irish Lack M.D.   On: 11/05/2013 15:31    Anti-infectives: Anti-infectives   Start     Dose/Rate Route Frequency Ordered Stop   11/05/13 1800  piperacillin-tazobactam (ZOSYN) IVPB 3.375 g     3.375 g 12.5 mL/hr over 240 Minutes Intravenous Every 8 hours 11/05/13 1654     11/05/13 1300  cefTRIAXone (ROCEPHIN) 2 g in dextrose 5 % 50 mL IVPB  Status:  Discontinued     2 g 100 mL/hr over 30 Minutes Intravenous Every 24 hours 11/04/13 1609 11/05/13 1647      Assessment/Plan: s/p * No surgery found *  Hepatic abscess drain intact Will follow   LOS: 2 days    [name]  A 11/06/2013

## 2013-11-07 ENCOUNTER — Ambulatory Visit (HOSPITAL_COMMUNITY): Payer: Medicare Other

## 2013-11-07 DIAGNOSIS — B952 Enterococcus as the cause of diseases classified elsewhere: Secondary | ICD-10-CM

## 2013-11-07 DIAGNOSIS — G4700 Insomnia, unspecified: Secondary | ICD-10-CM | POA: Diagnosis present

## 2013-11-07 LAB — BASIC METABOLIC PANEL
BUN: 7 mg/dL (ref 6–23)
CO2: 27 mEq/L (ref 19–32)
Calcium: 8.4 mg/dL (ref 8.4–10.5)
Chloride: 100 mEq/L (ref 96–112)
Creatinine, Ser: 1.28 mg/dL — ABNORMAL HIGH (ref 0.50–1.10)
GFR calc Af Amer: 50 mL/min — ABNORMAL LOW (ref >90)
GFR calc non Af Amer: 43 mL/min — ABNORMAL LOW (ref >90)

## 2013-11-07 LAB — CBC
HCT: 26.6 % — ABNORMAL LOW (ref 36.0–46.0)
MCH: 25.8 pg — ABNORMAL LOW (ref 26.0–34.0)
MCHC: 30.5 g/dL (ref 30.0–36.0)
MCV: 84.7 fL (ref 78.0–100.0)
Platelets: 465 10*3/uL — ABNORMAL HIGH (ref 150–400)
RDW: 20.4 % — ABNORMAL HIGH (ref 11.5–15.5)

## 2013-11-07 LAB — GLUCOSE, CAPILLARY
Glucose-Capillary: 147 mg/dL — ABNORMAL HIGH (ref 70–99)
Glucose-Capillary: 202 mg/dL — ABNORMAL HIGH (ref 70–99)

## 2013-11-07 NOTE — Progress Notes (Signed)
Chart reviewed.  Assessment/Plan:  Principal Problem:   Liver abscess with E coli and enteroccccal bacteremia: s/p perc drain.  Await liver abscess culture. Now on zosyn per ID.  HIV NR Active Problems:   HTN (hypertension)   Malignant neoplasm of pancreas, part unspecified   Antineoplastic chemotherapy induced anemia Hypokalemia, mild.  DM 2: stable  Subjective: Continues to feel better daily.  Ambulated around unit multiple times  Objective: Vital signs in last 24 hours: Temp:  [98.3 F (36.8 C)-98.7 F (37.1 C)] 98.7 F (37.1 C) (12/12 1419) Pulse Rate:  [84-92] 92 (12/12 1419) Resp:  [18-19] 19 (12/12 1419) BP: (124-131)/(62-80) 131/79 mmHg (12/12 1419) SpO2:  [100 %] 100 % (12/12 1419) Weight:  [71.215 kg (157 lb)] 71.215 kg (157 lb) (12/12 0500) Weight change: -0.285 kg (-10.1 oz) Last BM Date: 11/06/13  Intake/Output from previous day: 12/11 0701 - 12/12 0700 In: 600 [P.O.:600] Out: 75 [Urine:3; Drains:70; Stool:2] Intake/Output this shift:    Gen: nontoxic. Comfortable Lungs CTA without WRR CV RRR without MGR Abd soft. Drain with cloudy tan fluid  Lab Results:  Recent Labs  11/05/13 0500 11/07/13 0615  WBC 16.1* 14.8*  HGB 7.2* 8.1*  HCT 22.7* 26.6*  PLT 436* 465*   BMET  Recent Labs  11/05/13 0500 11/07/13 0615  NA 137 137  K 3.4* 4.0  CL 105 100  CO2 25 27  GLUCOSE 173* 107*  BUN 10 7  CREATININE 1.10 1.28*  CALCIUM 7.8* 8.4    Studies/Results: No results found.  Medications:  Scheduled Meds: . amLODipine  5 mg Oral Daily  . enoxaparin (LOVENOX) injection  40 mg Subcutaneous Q24H  . furosemide  40 mg Oral Daily  . insulin aspart  0-9 Units Subcutaneous TID WC  . losartan  100 mg Oral Daily  . pantoprazole  40 mg Oral Daily  . piperacillin-tazobactam (ZOSYN)  IV  3.375 g Intravenous Q8H  . potassium chloride SA  40 mEq Oral Daily  . senna  1 tablet Oral Daily  . sodium chloride  10-40 mL Intracatheter Q12H  . sodium chloride   3 mL Intravenous Q12H   Continuous Infusions:  PRN Meds:.sodium chloride, acetaminophen, acetaminophen, ALPRAZolam, alum & mag hydroxide-simeth, HYDROcodone-acetaminophen, ondansetron (ZOFRAN) IV, ondansetron, sodium chloride, sodium chloride, traZODone   LOS: 3 days   [name] L 11/07/2013, 5:34 PM

## 2013-11-07 NOTE — Progress Notes (Signed)
INFECTIOUS DISEASE PROGRESS NOTE  ID: [name] is a 67 y.o. female with  Principal Problem:   Liver abscess Active Problems:   HTN (hypertension)   Malignant neoplasm of pancreas, part unspecified   Antineoplastic chemotherapy induced anemia(285.3)   Fever   Hepatic abscess   Insomnia  Subjective: Without complaints  Abtx:  Anti-infectives   Start     Dose/Rate Route Frequency Ordered Stop   11/05/13 1800  piperacillin-tazobactam (ZOSYN) IVPB 3.375 g     3.375 g 12.5 mL/hr over 240 Minutes Intravenous Every 8 hours 11/05/13 1654     11/05/13 1300  cefTRIAXone (ROCEPHIN) 2 g in dextrose 5 % 50 mL IVPB  Status:  Discontinued     2 g 100 mL/hr over 30 Minutes Intravenous Every 24 hours 11/04/13 1609 11/05/13 1647      Medications:  Scheduled: . amLODipine  5 mg Oral Daily  . enoxaparin (LOVENOX) injection  40 mg Subcutaneous Q24H  . furosemide  40 mg Oral Daily  . insulin aspart  0-9 Units Subcutaneous TID WC  . losartan  100 mg Oral Daily  . pantoprazole  40 mg Oral Daily  . piperacillin-tazobactam (ZOSYN)  IV  3.375 g Intravenous Q8H  . potassium chloride SA  40 mEq Oral Daily  . senna  1 tablet Oral Daily  . sodium chloride  10-40 mL Intracatheter Q12H  . sodium chloride  3 mL Intravenous Q12H    Objective: Vital signs in last 24 hours: Temp:  [98.3 F (36.8 C)-98.7 F (37.1 C)] 98.7 F (37.1 C) (12/12 1419) Pulse Rate:  [84-92] 92 (12/12 1419) Resp:  [18-19] 19 (12/12 1419) BP: (124-131)/(62-80) 131/79 mmHg (12/12 1419) SpO2:  [100 %] 100 % (12/12 1419) Weight:  [71.215 kg (157 lb)] 71.215 kg (157 lb) (12/12 0500) Drain out 70 (11-06-13)  General appearance: alert, cooperative and no distress Resp: clear to auscultation bilaterally Chest wall: port non-tender Cardio: regular rate and rhythm GI: normal findings: bowel sounds normal and soft, non-tender  Lab Results  Recent Labs  11/05/13 0500 11/07/13 0615  WBC 16.1* 14.8*  HGB 7.2* 8.1*    HCT 22.7* 26.6*  NA 137 137  K 3.4* 4.0  CL 105 100  CO2 25 27  BUN 10 7  CREATININE 1.10 1.28*   Liver Panel  Recent Labs  11/04/13 1630  PROT 7.3  ALBUMIN 2.0*  AST 12  ALT 10  ALKPHOS 123*  BILITOT 0.3   Sedimentation Rate No results found for this basename: ESRSEDRATE,  in the last 72 hours C-Reactive Protein No results found for this basename: CRP,  in the last 72 hours  Microbiology: Recent Results (from the past 240 hour(s))  CULTURE, BLOOD (ROUTINE X 2)     Status: None   Collection Time    10/29/13  3:33 PM      Result Value Range Status   Specimen Description BLOOD PORTA CATH DRAWN BY RN GM   Final   Special Requests BOTTLES DRAWN AEROBIC AND ANAEROBIC [facility]   Final   Culture  Setup Time     Final   Value: 10/30/2013 16:31     Performed at [facility]   Culture     Final   Value: ESCHERICHIA COLI     ENTEROCOCCUS SPECIES     4 Note: Gram Stain Report Called to,Read Back By and Verified With: Edgar Frisk RN 12 14 850P EDMOJ Gram Stain Report Called to,Read Back By and Verified With:  Edgar Frisk RN 12 4 14  900P EDMOJ     Performed at [facility]   Report Status 11/06/2013 FINAL   Final   Organism ID, Bacteria ESCHERICHIA COLI   Final   Organism ID, Bacteria ENTEROCOCCUS SPECIES   Final  CULTURE, BLOOD (ROUTINE X 2)     Status: None   Collection Time    10/29/13  3:45 PM      Result Value Range Status   Specimen Description BLOOD RIGHT ANTECUBITAL   Final   Special Requests BOTTLES DRAWN AEROBIC AND ANAEROBIC 16CC   Final   Culture  Setup Time     Final   Value: 10/30/2013 13:41     Performed at [facility]   Culture     Final   Value: ESCHERICHIA COLI     Note: SUSCEPTIBILITIES PERFORMED ON PREVIOUS CULTURE WITHIN THE LAST 5 DAYS.     Note: Performed at [facility] Gram Stain Report Called to,Read Back By and Verified With: VALDISE DILDY RN ON 10/30/13 @0815  BY RESSEGGER R     Performed at [facility]    Report Status 11/01/2013 FINAL   Final  CULTURE, BLOOD (ROUTINE X 2)     Status: None   Collection Time    11/04/13  5:07 PM      Result Value Range Status   Specimen Description BLOOD RIGHT ARM   Final   Special Requests BOTTLES DRAWN AEROBIC ONLY 4CC   Final   Culture  Setup Time     Final   Value: 11/05/2013 01:21     Performed at [facility]   Culture     Final   Value:        BLOOD CULTURE RECEIVED NO GROWTH TO DATE CULTURE WILL BE HELD FOR 5 DAYS BEFORE ISSUING A FINAL NEGATIVE REPORT     Performed at [facility]   Report Status PENDING   Incomplete  CULTURE, BLOOD (ROUTINE X 2)     Status: None   Collection Time    11/04/13  5:11 PM      Result Value Range Status   Specimen Description BLOOD LEFT ARM   Final   Special Requests BOTTLES DRAWN AEROBIC ONLY 3CC'   Final   Culture  Setup Time     Final   Value: 11/05/2013 00:39     Performed at [facility]   Culture     Final   Value:        BLOOD CULTURE RECEIVED NO GROWTH TO DATE CULTURE WILL BE HELD FOR 5 DAYS BEFORE ISSUING A FINAL NEGATIVE REPORT     Performed at [facility]   Report Status PENDING   Incomplete  CULTURE, ROUTINE-ABSCESS     Status: None   Collection Time    11/05/13 10:19 AM      Result Value Range Status   Specimen Description ABSCESS   Final   Special Requests HEPATIC ABSCESS    Final   Gram Stain     Final   Value: ABUNDANT WBC PRESENT,BOTH PMN AND MONONUCLEAR     NO SQUAMOUS EPITHELIAL CELLS SEEN     FEW GRAM NEGATIVE RODS     Performed at [facility]   Culture     Final   Value: MULTIPLE ORGANISMS PRESENT, NONE PREDOMINANT     Performed at [facility]   Report Status PENDING   Incomplete  ANAEROBIC CULTURE  Status: None   Collection Time    11/05/13 10:19 AM      Result Value Range Status   Specimen Description ABSCESS   Final   Special Requests HEPATIC ABSCESS   Final   Gram Stain     Final   Value: ABUNDANT WBC PRESENT,BOTH PMN AND  MONONUCLEAR     NO SQUAMOUS EPITHELIAL CELLS SEEN     FEW GRAM NEGATIVE RODS     Performed at [facility]   Culture     Final   Value: NO ANAEROBES ISOLATED; CULTURE IN PROGRESS FOR 5 DAYS     Performed at [facility]   Report Status PENDING   Incomplete    Studies/Results: No results found.   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  Will continue to watch, repeat CT in 12-15/16?          Johny Sax Infectious Diseases (pager) [phone] [email or web address] 11/07/2013, 4:27 PM  LOS: 3 days

## 2013-11-07 NOTE — Progress Notes (Signed)
Subjective: Hepatic abscess drain placed 12/10 Feeling better each day.  Objective: Vital signs in last 24 hours: Temp:  [98.3 F (36.8 C)-98.8 F (37.1 C)] 98.3 F (36.8 C) (12/12 0554) Pulse Rate:  [84-89] 89 (12/12 0554) Resp:  [16-18] 18 (12/12 0554) BP: (124-134)/(62-80) 131/80 mmHg (12/12 0554) SpO2:  [98 %-100 %] 100 % (12/12 0554) Weight:  [157 lb (71.215 kg)] 157 lb (71.215 kg) (12/12 0500) Last BM Date: 11/06/13    PE:  Afeb; vss Comfortable Drain intact, site clean, dry, NT 70cc recorded yesterday, ~15cc in bulb this am. Output murky beige with debris in line. Line flushed free of debris 5cc NS flushed internally, aspirated cloudy fluid back, reconnected to bulb suction.  Lab Results:   Recent Labs  11/05/13 0500 11/07/13 0615  WBC 16.1* 14.8*  HGB 7.2* 8.1*  HCT 22.7* 26.6*  PLT 436* 465*   BMET  Recent Labs  11/05/13 0500 11/07/13 0615  NA 137 137  K 3.4* 4.0  CL 105 100  CO2 25 27  GLUCOSE 173* 107*  BUN 10 7  CREATININE 1.10 1.28*  CALCIUM 7.8* 8.4   PT/INR  Recent Labs  11/04/13 1305  LABPROT 15.2  INR 1.23   ABG No results found for this basename: PHART, PCO2, PO2, HCO3,  in the last 72 hours  Studies/Results: Ct Image Guided Drainage Percut Cath  Peritoneal Retroperit  11/05/2013   CLINICAL DATA:  History of metastatic pancreatic carcinoma to the liver. There is clinical evidence of infection with CT demonstrating a new liquefied area in the inferior liver suspicious for hepatic abscess. The patient presents for needle aspiration with possible drainage catheter placement.  EXAM: CT GUIDED DRAINAGE OF HEPATIC ABSCESS  ANESTHESIA/SEDATION: 1.0 Mg IV Versed 25 mcg IV Fentanyl  Total Moderate Sedation Time:  23 minutes  PROCEDURE: The procedure, risks, benefits, and alternatives were explained to the patient. Questions regarding the procedure were encouraged and answered. The patient understands and consents to the procedure.  The  anterior right abdominal wall was prepped with Betadinein a sterile fashion, and a sterile drape was applied covering the operative field. A sterile gown and sterile gloves were used for the procedure. Local anesthesia was provided with 1% Lidocaine.  CT was performed of the liver in a supine position. After localizing the abnormal fluid collection in the inferior right lobe, an 18 gauge needle was advanced under CT guidance into the collection. Fluid aspiration was performed. A sample was sent for culture analysis.  A guidewire was advanced through the needle. The tract was dilated and a 10 French percutaneous drain advanced into the liver. Catheter positioning was confirmed by CT. The catheter was flushed and connected to a suction bulb. The catheter was secured at the skin with a Prolene retention suture and StatLock device.  COMPLICATIONS: None  FINDINGS: Aspiration at the level of the more liquefied collection in the inferior right lobe of the liver yielded grossly purulent fluid. A percutaneous drain was formed in the collection and is draining well after placement. Output will be followed.  IMPRESSION: CT-guided drainage of hepatic abscess in the inferior right lobe. A 10 French drainage catheter was placed and attached to a suction bulb.   Electronically Signed   By: Irish Lack M.D.   On: 11/05/2013 15:31    Anti-infectives: Anti-infectives   Start     Dose/Rate Route Frequency Ordered Stop   11/05/13 1800  piperacillin-tazobactam (ZOSYN) IVPB 3.375 g     3.375 g 12.5  mL/hr over 240 Minutes Intravenous Every 8 hours 11/05/13 1654     11/05/13 1300  cefTRIAXone (ROCEPHIN) 2 g in dextrose 5 % 50 mL IVPB  Status:  Discontinued     2 g 100 mL/hr over 30 Minutes Intravenous Every 24 hours 11/04/13 1609 11/05/13 1647      Assessment/Plan: s/p Hepatic abscess drain Good function WBC down Output trending down Cont flushes as ordered.   LOS: 3 days    Brayton El 11/07/2013

## 2013-11-08 ENCOUNTER — Ambulatory Visit (HOSPITAL_COMMUNITY): Payer: Medicare Other

## 2013-11-08 LAB — CULTURE, ROUTINE-ABSCESS

## 2013-11-08 LAB — GLUCOSE, CAPILLARY
Glucose-Capillary: 113 mg/dL — ABNORMAL HIGH (ref 70–99)
Glucose-Capillary: 139 mg/dL — ABNORMAL HIGH (ref 70–99)

## 2013-11-08 NOTE — Progress Notes (Signed)
   Assessment/Plan:  Principal Problem:   Liver abscess with E coli and enteroccccal bacteremia: s/p perc drain.  Abscess cx shows multiple orgs. Anaerobic GNR. abx per ID. rescan when drain output <10 cc/day. 70 yesterday. Active Problems:   HTN (hypertension)   Malignant neoplasm of pancreas, part unspecified   Antineoplastic chemotherapy induced anemia Hypokalemia, resolved DM 2: stable  Subjective: No complaints  Objective: Vital signs in last 24 hours: Temp:  [98.7 F (37.1 C)-98.8 F (37.1 C)] 98.8 F (37.1 C) (12/13 0523) Pulse Rate:  [81-92] 81 (12/13 0523) Resp:  [19-20] 20 (12/13 0523) BP: (120-138)/(65-79) 138/68 mmHg (12/13 0523) SpO2:  [99 %-100 %] 100 % (12/13 0523) Weight:  [71 kg (156 lb 8.4 oz)] 71 kg (156 lb 8.4 oz) (12/13 0654) Weight change: -0.215 kg (-7.6 oz) Last BM Date: 11/07/13  Intake/Output from previous day: 12/12 0701 - 12/13 0700 In: 1226 [P.O.:700; I.V.:526] Out: 6 [Drains:43] Intake/Output this shift: Total I/O In: 360 [P.O.:360] Out: -   Gen: nontoxic. Comfortable Lungs CTA without WRR CV RRR without MGR Abd soft. Drain with cloudy tan fluid  Lab Results:  Recent Labs  11/07/13 0615  WBC 14.8*  HGB 8.1*  HCT 26.6*  PLT 465*   BMET  Recent Labs  11/07/13 0615  NA 137  K 4.0  CL 100  CO2 27  GLUCOSE 107*  BUN 7  CREATININE 1.28*  CALCIUM 8.4    Studies/Results: No results found.  Medications:  Scheduled Meds: . amLODipine  5 mg Oral Daily  . enoxaparin (LOVENOX) injection  40 mg Subcutaneous Q24H  . furosemide  40 mg Oral Daily  . insulin aspart  0-9 Units Subcutaneous TID WC  . losartan  100 mg Oral Daily  . pantoprazole  40 mg Oral Daily  . piperacillin-tazobactam (ZOSYN)  IV  3.375 g Intravenous Q8H  . potassium chloride SA  40 mEq Oral Daily  . senna  1 tablet Oral Daily  . sodium chloride  10-40 mL Intracatheter Q12H  . sodium chloride  3 mL Intravenous Q12H   Continuous Infusions:  PRN  Meds:.sodium chloride, acetaminophen, acetaminophen, ALPRAZolam, alum & mag hydroxide-simeth, HYDROcodone-acetaminophen, ondansetron (ZOFRAN) IV, ondansetron, sodium chloride, sodium chloride, traZODone   LOS: 4 days   Taiz Bickle L 11/08/2013, 2:13 PM

## 2013-11-08 NOTE — Progress Notes (Signed)
Subjective: Patient states she is feeling good today, denies any abdominal pain, N/V. She denies any fever or chills.  Objective: Physical Exam: BP 138/68  Pulse 81  Temp(Src) 98.8 F (37.1 C) (Oral)  Resp 20  Ht 5\' 5"  (1.651 m)  Wt 156 lb 8.4 oz (71 kg)  BMI 26.05 kg/m2  SpO2 100%  Abd: Soft, NT, ND, (+) BS, dressing C/D/I, no signs of leaking or bleeding. Ouput 24 hr light yellow less debris 43cc (70cc)   Labs: CBC  Recent Labs  11/07/13 0615  WBC 14.8*  HGB 8.1*  HCT 26.6*  PLT 465*   BMET  Recent Labs  11/07/13 0615  NA 137  K 4.0  CL 100  CO2 27  GLUCOSE 107*  BUN 7  CREATININE 1.28*  CALCIUM 8.4   LFT No results found for this basename: PROT, ALBUMIN, AST, ALT, ALKPHOS, BILITOT, BILIDIR, IBILI, LIPASE,  in the last 72 hours PT/INR No results found for this basename: LABPROT, INR,  in the last 72 hours   Studies/Results: No results found.  Assessment/Plan: Hepatic abscess s/p percutaneous drain placed 12/10 Continue to follow output, afebrile, wbc trending down  Flush Qshift. Repeat imaging once output <10cc All other plans per [facility].    LOS: 4 days    Berneta Levins PA-C 11/08/2013 11:13 AM

## 2013-11-08 NOTE — Progress Notes (Signed)
ANTIBIOTIC CONSULT NOTE - FOLLOW UP  Pharmacy Consult for zosyn Indication: liver abscess and E-coli / enterococcus bacteremia  No Known Allergies  Patient Measurements: Height: 5\' 5"  (165.1 cm) Weight: 156 lb 8.4 oz (71 kg) IBW/kg (Calculated) : 57 Adjusted Body Weight:   Vital Signs: Temp: 98.8 F (37.1 C) (12/13 0523) Temp src: Oral (12/13 0523) BP: 138/68 mmHg (12/13 0523) Pulse Rate: 81 (12/13 0523) Intake/Output from previous day: 12/12 0701 - 12/13 0700 In: 1226 [P.O.:700; I.V.:526] Out: 30 [Drains:43] Intake/Output from this shift: Total I/O In: 360 [P.O.:360] Out: -   Labs:  Recent Labs  11/07/13 0615  WBC 14.8*  HGB 8.1*  PLT 465*  CREATININE 1.28*   Estimated Creatinine Clearance: 43.3 ml/min (by C-G formula based on Cr of 1.28).   Microbiology: Recent Results (from the past 720 hour(s))  CULTURE, BLOOD (ROUTINE X 2)     Status: None   Collection Time    10/29/13  3:33 PM      Result Value Range Status   Specimen Description BLOOD PORTA CATH DRAWN BY RN GM   Final   Special Requests BOTTLES DRAWN AEROBIC AND ANAEROBIC [facility]   Final   Culture  Setup Time     Final   Value: 10/30/2013 16:31     Performed at [facility]   Culture     Final   Value: ESCHERICHIA COLI     ENTEROCOCCUS SPECIES     4 Note: Gram Stain Report Called to,Read Back By and Verified With: Edgar Frisk RN 12 14 850P EDMOJ Gram Stain Report Called to,Read Back By and Verified With: Edgar Frisk RN 12 4 14  900P EDMOJ     Performed at [facility]   Report Status 11/06/2013 FINAL   Final   Organism ID, Bacteria ESCHERICHIA COLI   Final   Organism ID, Bacteria ENTEROCOCCUS SPECIES   Final  CULTURE, BLOOD (ROUTINE X 2)     Status: None   Collection Time    10/29/13  3:45 PM      Result Value Range Status   Specimen Description BLOOD RIGHT ANTECUBITAL   Final   Special Requests BOTTLES DRAWN AEROBIC AND ANAEROBIC 16CC   Final   Culture  Setup Time     Final   Value: 10/30/2013 13:41     Performed at [facility]   Culture     Final   Value: ESCHERICHIA COLI     Note: SUSCEPTIBILITIES PERFORMED ON PREVIOUS CULTURE WITHIN THE LAST 5 DAYS.     Note: Performed at [facility] Gram Stain Report Called to,Read Back By and Verified With: VALDISE DILDY RN ON 10/30/13 @0815  BY RESSEGGER R     Performed at [facility]   Report Status 11/01/2013 FINAL   Final  CULTURE, BLOOD (ROUTINE X 2)     Status: None   Collection Time    11/04/13  5:07 PM      Result Value Range Status   Specimen Description BLOOD RIGHT ARM   Final   Special Requests BOTTLES DRAWN AEROBIC ONLY 4CC   Final   Culture  Setup Time     Final   Value: 11/05/2013 01:21     Performed at [facility]   Culture     Final   Value:        BLOOD CULTURE RECEIVED NO GROWTH TO DATE CULTURE WILL BE HELD FOR 5 DAYS BEFORE ISSUING A FINAL NEGATIVE REPORT  Performed at [facility]   Report Status PENDING   Incomplete  CULTURE, BLOOD (ROUTINE X 2)     Status: None   Collection Time    11/04/13  5:11 PM      Result Value Range Status   Specimen Description BLOOD LEFT ARM   Final   Special Requests BOTTLES DRAWN AEROBIC ONLY 3CC'   Final   Culture  Setup Time     Final   Value: 11/05/2013 00:39     Performed at [facility]   Culture     Final   Value:        BLOOD CULTURE RECEIVED NO GROWTH TO DATE CULTURE WILL BE HELD FOR 5 DAYS BEFORE ISSUING A FINAL NEGATIVE REPORT     Performed at [facility]   Report Status PENDING   Incomplete  CULTURE, ROUTINE-ABSCESS     Status: None   Collection Time    11/05/13 10:19 AM      Result Value Range Status   Specimen Description ABSCESS   Final   Special Requests HEPATIC ABSCESS    Final   Gram Stain     Final   Value: ABUNDANT WBC PRESENT,BOTH PMN AND MONONUCLEAR     NO SQUAMOUS EPITHELIAL CELLS SEEN     FEW GRAM NEGATIVE RODS     Performed at [facility]   Culture     Final    Value: MULTIPLE ORGANISMS PRESENT, NONE PREDOMINANT     Note: NO STAPHYLOCOCCUS AUREUS ISOLATED NO GROUP A STREP (S.PYOGENES) ISOLATED     Performed at [facility]   Report Status 11/08/2013 FINAL   Final  ANAEROBIC CULTURE     Status: None   Collection Time    11/05/13 10:19 AM      Result Value Range Status   Specimen Description ABSCESS   Final   Special Requests HEPATIC ABSCESS   Final   Gram Stain     Final   Value: ABUNDANT WBC PRESENT,BOTH PMN AND MONONUCLEAR     NO SQUAMOUS EPITHELIAL CELLS SEEN     FEW GRAM NEGATIVE RODS     Performed at [facility]   Culture     Final   Value: NO ANAEROBES ISOLATED; CULTURE IN PROGRESS FOR 5 DAYS     Performed at [facility]   Report Status PENDING   Incomplete   Assessment: 67 yo female with liver abscess and E-coli / enterococcus bacteremia who was  switched from ceftriaxone to Zosyn on 12/10. SCr 1.28 with est  CrCl 40-58mL/min. NO new growth on cultures. ID continuing to watch. Hepatic drain with decreased output. Plan for repeat CT Monday or Tuesday.  Plan:  1. Zosyn 3.375g iv q8h (4hr infusion) 2. F/u ID recommendations, LOT, renal function  Lorice Lafave D. Yoceline Bazar, PharmD, BCPS Clinical Pharmacist Pager: [phone] 11/08/2013 1:56 PM

## 2013-11-09 ENCOUNTER — Ambulatory Visit (HOSPITAL_COMMUNITY): Payer: Medicare Other

## 2013-11-09 LAB — GLUCOSE, CAPILLARY
Glucose-Capillary: 208 mg/dL — ABNORMAL HIGH (ref 70–99)
Glucose-Capillary: 228 mg/dL — ABNORMAL HIGH (ref 70–99)

## 2013-11-09 MED ORDER — INSULIN GLARGINE 100 UNIT/ML ~~LOC~~ SOLN
5.0000 [IU] | Freq: Every day | SUBCUTANEOUS | Status: DC
  Administered 2013-11-09 – 2013-11-10 (×2): 5 [IU] via SUBCUTANEOUS
  Filled 2013-11-09 (×3): qty 0.05

## 2013-11-09 NOTE — Progress Notes (Signed)
   Assessment/Plan:  Principal Problem:   Liver abscess with E coli and enteroccccal bacteremia: s/p perc drain.  Abscess cx shows multiple orgs. Anaerobic GNR. abx per ID. rescan when drain output <10 cc/day.  Active Problems:   HTN (hypertension)   Malignant neoplasm of pancreas, part unspecified   Antineoplastic chemotherapy induced anemia Hypokalemia, resolved DM 2: will resume lantus 5 units qhs  Subjective: No complaints  Objective: Vital signs in last 24 hours: Temp:  [97.7 F (36.5 C)-98.8 F (37.1 C)] 97.7 F (36.5 C) (12/14 0514) Pulse Rate:  [86-96] 91 (12/14 0514) Resp:  [18-20] 18 (12/14 0514) BP: (117-141)/(57-89) 135/72 mmHg (12/14 0514) SpO2:  [97 %-99 %] 99 % (12/14 0514) Weight:  [70.761 kg (156 lb)] 70.761 kg (156 lb) (12/14 0514) Weight change: -0.239 kg (-8.4 oz) Last BM Date: 11/07/13  Intake/Output from previous day: 12/13 0701 - 12/14 0700 In: 472 [P.O.:360; I.V.:112] Out: 40 [Drains:40] Intake/Output this shift: Total I/O In: -  Out: 220 [Urine:200; Drains:20]  Gen: nontoxic. Comfortable Lungs CTA without WRR CV RRR without MGR Abd soft. Drain small amount tan clear.   Lab Results:  Recent Labs  11/07/13 0615  WBC 14.8*  HGB 8.1*  HCT 26.6*  PLT 465*   BMET  Recent Labs  11/07/13 0615  NA 137  K 4.0  CL 100  CO2 27  GLUCOSE 107*  BUN 7  CREATININE 1.28*  CALCIUM 8.4    Studies/Results: No results found.  Medications:  Scheduled Meds: . amLODipine  5 mg Oral Daily  . enoxaparin (LOVENOX) injection  40 mg Subcutaneous Q24H  . furosemide  40 mg Oral Daily  . insulin aspart  0-9 Units Subcutaneous TID WC  . losartan  100 mg Oral Daily  . pantoprazole  40 mg Oral Daily  . piperacillin-tazobactam (ZOSYN)  IV  3.375 g Intravenous Q8H  . potassium chloride SA  40 mEq Oral Daily  . senna  1 tablet Oral Daily  . sodium chloride  10-40 mL Intracatheter Q12H  . sodium chloride  3 mL Intravenous Q12H   Continuous  Infusions:  PRN Meds:.sodium chloride, acetaminophen, acetaminophen, ALPRAZolam, alum & mag hydroxide-simeth, HYDROcodone-acetaminophen, ondansetron (ZOFRAN) IV, ondansetron, sodium chloride, sodium chloride, traZODone   LOS: 5 days   Agam Tuohy L 11/09/2013, 2:04 PM

## 2013-11-10 ENCOUNTER — Ambulatory Visit (HOSPITAL_COMMUNITY): Payer: Medicare Other

## 2013-11-10 ENCOUNTER — Inpatient Hospital Stay (HOSPITAL_COMMUNITY): Payer: Medicare Other

## 2013-11-10 LAB — GLUCOSE, CAPILLARY
Glucose-Capillary: 191 mg/dL — ABNORMAL HIGH (ref 70–99)
Glucose-Capillary: 158 mg/dL — ABNORMAL HIGH (ref 70–99)
Glucose-Capillary: 116 mg/dL — ABNORMAL HIGH (ref 70–99)

## 2013-11-10 LAB — ANAEROBIC CULTURE

## 2013-11-10 NOTE — Progress Notes (Signed)
INFECTIOUS DISEASE PROGRESS NOTE  ID: [name] is a 67 y.o. female with  Principal Problem:   Liver abscess Active Problems:   HTN (hypertension)   Malignant neoplasm of pancreas, part unspecified   Antineoplastic chemotherapy induced anemia(285.3)   Fever   Hepatic abscess   Insomnia  Subjective: Without complaints. Abd distended, normal BM.   Abtx:  Anti-infectives   Start     Dose/Rate Route Frequency Ordered Stop   11/05/13 1800  piperacillin-tazobactam (ZOSYN) IVPB 3.375 g     3.375 g 12.5 mL/hr over 240 Minutes Intravenous Every 8 hours 11/05/13 1654     11/05/13 1300  cefTRIAXone (ROCEPHIN) 2 g in dextrose 5 % 50 mL IVPB  Status:  Discontinued     2 g 100 mL/hr over 30 Minutes Intravenous Every 24 hours 11/04/13 1609 11/05/13 1647      Medications:  Scheduled: . amLODipine  5 mg Oral Daily  . enoxaparin (LOVENOX) injection  40 mg Subcutaneous Q24H  . furosemide  40 mg Oral Daily  . insulin aspart  0-9 Units Subcutaneous TID WC  . insulin glargine  5 Units Subcutaneous QHS  . losartan  100 mg Oral Daily  . pantoprazole  40 mg Oral Daily  . piperacillin-tazobactam (ZOSYN)  IV  3.375 g Intravenous Q8H  . potassium chloride SA  40 mEq Oral Daily  . senna  1 tablet Oral Daily  . sodium chloride  10-40 mL Intracatheter Q12H  . sodium chloride  3 mL Intravenous Q12H    Objective: Vital signs in last 24 hours: Temp:  [98 F (36.7 C)-98.5 F (36.9 C)] 98.3 F (36.8 C) (12/15 1417) Pulse Rate:  [79-98] 98 (12/15 1417) Resp:  [18-19] 18 (12/15 1417) BP: (110-133)/(61-71) 127/70 mmHg (12/15 1417) SpO2:  [96 %-100 %] 99 % (12/15 1417)   General appearance: alert, cooperative and no distress Resp: clear to auscultation bilaterally Chest wall: port site- non-temder, no fluctuance.  Cardio: regular rate and rhythm GI: normal findings: bowel sounds normal and soft, non-tender and abnormal findings:  distended  Lab Results No results found for this  basename: WBC, HGB, HCT, PLATELETS, NA, K, CL, CO2, BUN, CREATININE, GLU,  in the last 72 hours Liver Panel No results found for this basename: PROT, ALBUMIN, AST, ALT, ALKPHOS, BILITOT, BILIDIR, IBILI,  in the last 72 hours Sedimentation Rate No results found for this basename: ESRSEDRATE,  in the last 72 hours C-Reactive Protein No results found for this basename: CRP,  in the last 72 hours  Microbiology: Recent Results (from the past 240 hour(s))  CULTURE, BLOOD (ROUTINE X 2)     Status: None   Collection Time    11/04/13  5:07 PM      Result Value Range Status   Specimen Description BLOOD RIGHT ARM   Final   Special Requests BOTTLES DRAWN AEROBIC ONLY 4CC   Final   Culture  Setup Time     Final   Value: 11/05/2013 01:21     Performed at [facility]   Culture     Final   Value:        BLOOD CULTURE RECEIVED NO GROWTH TO DATE CULTURE WILL BE HELD FOR 5 DAYS BEFORE ISSUING A FINAL NEGATIVE REPORT     Performed at [facility]   Report Status PENDING   Incomplete  CULTURE, BLOOD (ROUTINE X 2)     Status: None   Collection Time    11/04/13  5:11 PM  Result Value Range Status   Specimen Description BLOOD LEFT ARM   Final   Special Requests BOTTLES DRAWN AEROBIC ONLY 3CC'   Final   Culture  Setup Time     Final   Value: 11/05/2013 00:39     Performed at [facility]   Culture     Final   Value:        BLOOD CULTURE RECEIVED NO GROWTH TO DATE CULTURE WILL BE HELD FOR 5 DAYS BEFORE ISSUING A FINAL NEGATIVE REPORT     Performed at [facility]   Report Status PENDING   Incomplete  CULTURE, ROUTINE-ABSCESS     Status: None   Collection Time    11/05/13 10:19 AM      Result Value Range Status   Specimen Description ABSCESS   Final   Special Requests HEPATIC ABSCESS    Final   Gram Stain     Final   Value: ABUNDANT WBC PRESENT,BOTH PMN AND MONONUCLEAR     NO SQUAMOUS EPITHELIAL CELLS SEEN     FEW GRAM NEGATIVE RODS     Performed at [facility]   Culture     Final   Value: MULTIPLE ORGANISMS PRESENT, NONE PREDOMINANT     Note: NO STAPHYLOCOCCUS AUREUS ISOLATED NO GROUP A STREP (S.PYOGENES) ISOLATED     Performed at [facility]   Report Status 11/08/2013 FINAL   Final  ANAEROBIC CULTURE     Status: None   Collection Time    11/05/13 10:19 AM      Result Value Range Status   Specimen Description ABSCESS   Final   Special Requests HEPATIC ABSCESS   Final   Gram Stain     Final   Value: ABUNDANT WBC PRESENT,BOTH PMN AND MONONUCLEAR     NO SQUAMOUS EPITHELIAL CELLS SEEN     FEW GRAM NEGATIVE RODS     Performed at [facility]   Culture     Final   Value: NO ANAEROBES ISOLATED     Performed at [facility]   Report Status 11/10/2013 FINAL   Final    Studies/Results: No results found.   Assessment/Plan: Liver Abscess  Bacteremia (E coli R- amp/sulbactam, enterococcus S- amp, vanco)  Stage IV pancreatic CA (liver and lung mets)   Total days of antibiotics: 12 (zosyn) Plan for repeat CT when output < 10cc/day or in place 1 week (12-17).  Afebrile, WBC continues to improve.  Doing well.          Johny Sax Infectious Diseases (pager) [phone] [email or web address] 11/10/2013, 2:52 PM  LOS: 6 days

## 2013-11-10 NOTE — Progress Notes (Signed)
  Subjective: Hepatic abscess drain placed 12/10 Feeling better each day.  Objective: Vital signs in last 24 hours: Temp:  [98 F (36.7 C)-98.5 F (36.9 C)] 98.5 F (36.9 C) (12/15 0627) Pulse Rate:  [79-94] 89 (12/15 0627) Resp:  [18-19] 18 (12/15 0627) BP: (110-133)/(61-74) 133/71 mmHg (12/15 0627) SpO2:  [96 %-100 %] 96 % (12/15 0627) Last BM Date: 11/07/13    PE:  Afeb; vss Comfortable Drain intact, site clean, dry, NT 25cc recorded yesterday, ~25cc in bulb this am. Output is now thin clear with greenish tint. Some fibrinous debris in bulb as well   Lab Results:  No labs since 12/12  Anti-infectives: Anti-infectives   Start     Dose/Rate Route Frequency Ordered Stop   11/05/13 1800  piperacillin-tazobactam (ZOSYN) IVPB 3.375 g     3.375 g 12.5 mL/hr over 240 Minutes Intravenous Every 8 hours 11/05/13 1654     11/05/13 1300  cefTRIAXone (ROCEPHIN) 2 g in dextrose 5 % 50 mL IVPB  Status:  Discontinued     2 g 100 mL/hr over 30 Minutes Intravenous Every 24 hours 11/04/13 1609 11/05/13 1647      Assessment/Plan: s/p Hepatic abscess drain Good function WBC down Output trending down Cont flushes as ordered. Repeat CT when output <10cc per day or at 1 week since drain placement(12/10)   LOS: 6 days    Brayton El 11/10/2013

## 2013-11-10 NOTE — Progress Notes (Signed)
   Assessment/Plan:  Principal Problem:   Liver abscess with E coli and enteroccccal bacteremia: s/p perc drain.  Abscess cx shows multiple orgs. Anaerobic negative. abx per ID. rescan when drain output <10 cc/day or 1 week post drain. Active Problems:   HTN (hypertension)   Malignant neoplasm of pancreas, part unspecified   Antineoplastic chemotherapy induced anemia Hypokalemia, resolved DM 2: will resume lantus 5 units qhs  Subjective: No complaints  Objective: Vital signs in last 24 hours: Temp:  [98.3 F (36.8 C)-98.5 F (36.9 C)] 98.3 F (36.8 C) (12/15 1417) Pulse Rate:  [89-98] 98 (12/15 1417) Resp:  [18] 18 (12/15 1417) BP: (127-133)/(70-71) 127/70 mmHg (12/15 1417) SpO2:  [96 %-99 %] 99 % (12/15 1417) Weight change:  Last BM Date: 11/07/13  Intake/Output from previous day: 12/14 0701 - 12/15 0700 In: 182 [I.V.:172] Out: 225 [Urine:200; Drains:25] Intake/Output this shift:    Gen: nontoxic. Comfortable Lungs CTA without WRR CV RRR without MGR Abd soft. Drain small amount tan clear.   Lab Results: No results found for this basename: WBC, HGB, HCT, PLT,  in the last 72 hours BMET No results found for this basename: NA, K, CL, CO2, GLUCOSE, BUN, CREATININE, CALCIUM,  in the last 72 hours  Studies/Results: No results found.  Medications:  Scheduled Meds: . amLODipine  5 mg Oral Daily  . enoxaparin (LOVENOX) injection  40 mg Subcutaneous Q24H  . furosemide  40 mg Oral Daily  . insulin aspart  0-9 Units Subcutaneous TID WC  . insulin glargine  5 Units Subcutaneous QHS  . losartan  100 mg Oral Daily  . pantoprazole  40 mg Oral Daily  . piperacillin-tazobactam (ZOSYN)  IV  3.375 g Intravenous Q8H  . potassium chloride SA  40 mEq Oral Daily  . senna  1 tablet Oral Daily  . sodium chloride  10-40 mL Intracatheter Q12H  . sodium chloride  3 mL Intravenous Q12H   Continuous Infusions:  PRN Meds:.sodium chloride, acetaminophen, acetaminophen, ALPRAZolam,  alum & mag hydroxide-simeth, HYDROcodone-acetaminophen, ondansetron (ZOFRAN) IV, ondansetron, sodium chloride, sodium chloride, traZODone   LOS: 6 days   Casidee Jann L 11/10/2013, 10:33 PM

## 2013-11-11 ENCOUNTER — Ambulatory Visit (HOSPITAL_COMMUNITY): Payer: Medicare Other

## 2013-11-11 DIAGNOSIS — D6481 Anemia due to antineoplastic chemotherapy: Secondary | ICD-10-CM

## 2013-11-11 LAB — BASIC METABOLIC PANEL
CO2: 29 mEq/L (ref 19–32)
Calcium: 8.4 mg/dL (ref 8.4–10.5)
Chloride: 100 mEq/L (ref 96–112)
GFR calc non Af Amer: 46 mL/min — ABNORMAL LOW (ref >90)
Potassium: 4.2 mEq/L (ref 3.5–5.1)
Sodium: 136 mEq/L (ref 135–145)

## 2013-11-11 LAB — CBC
HCT: 25.5 % — ABNORMAL LOW (ref 36.0–46.0)
Platelets: 566 10*3/uL — ABNORMAL HIGH (ref 150–400)
RBC: 2.94 MIL/uL — ABNORMAL LOW (ref 3.87–5.11)
WBC: 12.4 10*3/uL — ABNORMAL HIGH (ref 4.0–10.5)

## 2013-11-11 LAB — CULTURE, BLOOD (ROUTINE X 2)
Culture: NO GROWTH
Culture: NO GROWTH

## 2013-11-11 LAB — GLUCOSE, CAPILLARY
Glucose-Capillary: 183 mg/dL — ABNORMAL HIGH (ref 70–99)
Glucose-Capillary: 181 mg/dL — ABNORMAL HIGH (ref 70–99)
Glucose-Capillary: 170 mg/dL — ABNORMAL HIGH (ref 70–99)

## 2013-11-11 MED ORDER — INSULIN GLARGINE 100 UNIT/ML ~~LOC~~ SOLN
8.0000 [IU] | Freq: Every day | SUBCUTANEOUS | Status: DC
  Administered 2013-11-11 – 2013-11-12 (×2): 8 [IU] via SUBCUTANEOUS
  Filled 2013-11-11 (×3): qty 0.08

## 2013-11-11 NOTE — Progress Notes (Signed)
  Subjective: Hepatic abscess drain placed 12/10 Better daily Ambulating lots daily Eating better  Objective: Vital signs in last 24 hours: Temp:  [98.2 F (36.8 C)-98.3 F (36.8 C)] 98.2 F (36.8 C) (12/16 0605) Pulse Rate:  [76-98] 76 (12/16 0605) Resp:  [18-20] 20 (12/16 0605) BP: (121-127)/(65-70) 121/65 mmHg (12/16 0605) SpO2:  [93 %-100 %] 100 % (12/16 0605) Last BM Date: 11/11/13  Intake/Output from previous day: 12/15 0701 - 12/16 0700 In: 10  Out: 50 [Drains:50] Intake/Output this shift: Total I/O In: 10 [Other:10] Out: 30 [Drains:30]  PE: afeb; vss Drain intact Output 50 cc yesterday (approx 20 cc/flush) Minimal yellow fluid in JP now- RN just emptied 30 cc (10 cc flush) Site clean and dry; NT Wbc 12.4  Lab Results:   Recent Labs  11/11/13 0458  WBC 12.4*  HGB 7.7*  HCT 25.5*  PLT 566*   BMET  Recent Labs  11/11/13 0458  NA 136  K 4.2  CL 100  CO2 29  GLUCOSE 229*  BUN 11  CREATININE 1.21*  CALCIUM 8.4   PT/INR No results found for this basename: LABPROT, INR,  in the last 72 hours ABG No results found for this basename: PHART, PCO2, PO2, HCO3,  in the last 72 hours  Studies/Results: No results found.  Anti-infectives: Anti-infectives   Start     Dose/Rate Route Frequency Ordered Stop   11/05/13 1800  piperacillin-tazobactam (ZOSYN) IVPB 3.375 g     3.375 g 12.5 mL/hr over 240 Minutes Intravenous Every 8 hours 11/05/13 1654     11/05/13 1300  cefTRIAXone (ROCEPHIN) 2 g in dextrose 5 % 50 mL IVPB  Status:  Discontinued     2 g 100 mL/hr over 30 Minutes Intravenous Every 24 hours 11/04/13 1609 11/05/13 1647      Assessment/Plan: s/p * No surgery found * Hepatic abscess Drain placed 12/10 Better daily Need CT when output 10-15 cc/24 hrs (minus flush) Plan per MD   LOS: 7 days    Jiovanny Burdell A 11/11/2013

## 2013-11-11 NOTE — Progress Notes (Signed)
INFECTIOUS DISEASE PROGRESS NOTE  ID: [name] is a 67 y.o. female with  Principal Problem:   Liver abscess Active Problems:   HTN (hypertension)   Malignant neoplasm of pancreas, part unspecified   Antineoplastic chemotherapy induced anemia(285.3)   Fever   Hepatic abscess   Insomnia  Subjective: without complaints, tired from ambulating  Abtx:  Anti-infectives   Start     Dose/Rate Route Frequency Ordered Stop   11/05/13 1800  piperacillin-tazobactam (ZOSYN) IVPB 3.375 g     3.375 g 12.5 mL/hr over 240 Minutes Intravenous Every 8 hours 11/05/13 1654     11/05/13 1300  cefTRIAXone (ROCEPHIN) 2 g in dextrose 5 % 50 mL IVPB  Status:  Discontinued     2 g 100 mL/hr over 30 Minutes Intravenous Every 24 hours 11/04/13 1609 11/05/13 1647      Medications:  Scheduled: . amLODipine  5 mg Oral Daily  . enoxaparin (LOVENOX) injection  40 mg Subcutaneous Q24H  . furosemide  40 mg Oral Daily  . insulin aspart  0-9 Units Subcutaneous TID WC  . insulin glargine  5 Units Subcutaneous QHS  . losartan  100 mg Oral Daily  . pantoprazole  40 mg Oral Daily  . piperacillin-tazobactam (ZOSYN)  IV  3.375 g Intravenous Q8H  . potassium chloride SA  40 mEq Oral Daily  . senna  1 tablet Oral Daily  . sodium chloride  10-40 mL Intracatheter Q12H  . sodium chloride  3 mL Intravenous Q12H    Objective: Vital signs in last 24 hours: Temp:  [98.2 F (36.8 C)-98.3 F (36.8 C)] 98.2 F (36.8 C) (12/16 0605) Pulse Rate:  [76-98] 76 (12/16 0605) Resp:  [18-20] 20 (12/16 0605) BP: (121-127)/(65-70) 121/65 mmHg (12/16 0605) SpO2:  [93 %-100 %] 100 % (12/16 0605)   General appearance: alert, cooperative and no distress Resp: clear to auscultation bilaterally Cardio: regular rate and rhythm GI: normal findings: bowel sounds normal and soft, non-tender  Lab Results  Recent Labs  11/11/13 0458  WBC 12.4*  HGB 7.7*  HCT 25.5*  NA 136  K 4.2  CL 100  CO2 29  BUN 11    CREATININE 1.21*   Liver Panel No results found for this basename: PROT, ALBUMIN, AST, ALT, ALKPHOS, BILITOT, BILIDIR, IBILI,  in the last 72 hours Sedimentation Rate No results found for this basename: ESRSEDRATE,  in the last 72 hours C-Reactive Protein No results found for this basename: CRP,  in the last 72 hours  Microbiology: Recent Results (from the past 240 hour(s))  CULTURE, BLOOD (ROUTINE X 2)     Status: None   Collection Time    11/04/13  5:07 PM      Result Value Range Status   Specimen Description BLOOD RIGHT ARM   Final   Special Requests BOTTLES DRAWN AEROBIC ONLY 4CC   Final   Culture  Setup Time     Final   Value: 11/05/2013 01:21     Performed at [facility]   Culture     Final   Value: NO GROWTH 5 DAYS     Performed at [facility]   Report Status 11/11/2013 FINAL   Final  CULTURE, BLOOD (ROUTINE X 2)     Status: None   Collection Time    11/04/13  5:11 PM      Result Value Range Status   Specimen Description BLOOD LEFT ARM   Final   Special Requests BOTTLES  DRAWN AEROBIC ONLY 3CC'   Final   Culture  Setup Time     Final   Value: 11/05/2013 00:39     Performed at [facility]   Culture     Final   Value: NO GROWTH 5 DAYS     Performed at [facility]   Report Status 11/11/2013 FINAL   Final  CULTURE, ROUTINE-ABSCESS     Status: None   Collection Time    11/05/13 10:19 AM      Result Value Range Status   Specimen Description ABSCESS   Final   Special Requests HEPATIC ABSCESS    Final   Gram Stain     Final   Value: ABUNDANT WBC PRESENT,BOTH PMN AND MONONUCLEAR     NO SQUAMOUS EPITHELIAL CELLS SEEN     FEW GRAM NEGATIVE RODS     Performed at [facility]   Culture     Final   Value: MULTIPLE ORGANISMS PRESENT, NONE PREDOMINANT     Note: NO STAPHYLOCOCCUS AUREUS ISOLATED NO GROUP A STREP (S.PYOGENES) ISOLATED     Performed at [facility]   Report Status 11/08/2013 FINAL   Final  ANAEROBIC CULTURE      Status: None   Collection Time    11/05/13 10:19 AM      Result Value Range Status   Specimen Description ABSCESS   Final   Special Requests HEPATIC ABSCESS   Final   Gram Stain     Final   Value: ABUNDANT WBC PRESENT,BOTH PMN AND MONONUCLEAR     NO SQUAMOUS EPITHELIAL CELLS SEEN     FEW GRAM NEGATIVE RODS     Performed at [facility]   Culture     Final   Value: NO ANAEROBES ISOLATED     Performed at [facility]   Report Status 11/10/2013 FINAL   Final    Studies/Results: No results found.   Assessment/Plan: Liver Abscess   Drain placed 12-10 Bacteremia (E coli R- amp/sulbactam, enterococcus S- amp, vanco)  Stage IV pancreatic CA (liver and lung mets)   Total days of antibiotics: 13 (zosyn) Drain out 50cc yesterday.  Awaiting repeat CT Repeat BCx 12-9 ngtd         Johny Sax Infectious Diseases (pager) [phone] [email or web address] 11/11/2013, 11:36 AM  LOS: 7 days

## 2013-11-11 NOTE — Progress Notes (Signed)
ANTIBIOTIC CONSULT NOTE - FOLLOW UP  Pharmacy Consult for Zosyn Indication: Hepatic abscess and E.coli/Enterococcus bacteremia  No Known Allergies  Patient Measurements: Height: 5\' 5"  (165.1 cm) Weight: 156 lb (70.761 kg) IBW/kg (Calculated) : 57  Vital Signs: Temp: 98.5 F (36.9 C) (12/16 1335) Temp src: Oral (12/16 1335) BP: 119/65 mmHg (12/16 1335) Pulse Rate: 90 (12/16 1335) Intake/Output from previous day: 12/15 0701 - 12/16 0700 In: 10  Out: 50 [Drains:50] Intake/Output from this shift: Total I/O In: 1147 [I.V.:1137; Other:10] Out: 30 [Drains:30]  Labs:  Recent Labs  11/11/13 0458  WBC 12.4*  HGB 7.7*  PLT 566*  CREATININE 1.21*   Estimated Creatinine Clearance: 45.7 ml/min (by C-G formula based on Cr of 1.21). No results found for this basename: VANCOTROUGH, Leodis Binet, VANCORANDOM, GENTTROUGH, GENTPEAK, GENTRANDOM, TOBRATROUGH, TOBRAPEAK, TOBRARND, AMIKACINPEAK, AMIKACINTROU, AMIKACIN,  in the last 72 hours   Microbiology: Recent Results (from the past 720 hour(s))  CULTURE, BLOOD (ROUTINE X 2)     Status: None   Collection Time    10/29/13  3:33 PM      Result Value Range Status   Specimen Description BLOOD PORTA CATH DRAWN BY RN GM   Final   Special Requests BOTTLES DRAWN AEROBIC AND ANAEROBIC [facility]   Final   Culture  Setup Time     Final   Value: 10/30/2013 16:31     Performed at [facility]   Culture     Final   Value: ESCHERICHIA COLI     ENTEROCOCCUS SPECIES     4 Note: Gram Stain Report Called to,Read Back By and Verified With: Edgar Frisk RN 12 14 850P EDMOJ Gram Stain Report Called to,Read Back By and Verified With: Edgar Frisk RN 12 4 14  900P EDMOJ     Performed at [facility]   Report Status 11/06/2013 FINAL   Final   Organism ID, Bacteria ESCHERICHIA COLI   Final   Organism ID, Bacteria ENTEROCOCCUS SPECIES   Final  CULTURE, BLOOD (ROUTINE X 2)     Status: None   Collection Time    10/29/13  3:45 PM      Result Value  Range Status   Specimen Description BLOOD RIGHT ANTECUBITAL   Final   Special Requests BOTTLES DRAWN AEROBIC AND ANAEROBIC 16CC   Final   Culture  Setup Time     Final   Value: 10/30/2013 13:41     Performed at [facility]   Culture     Final   Value: ESCHERICHIA COLI     Note: SUSCEPTIBILITIES PERFORMED ON PREVIOUS CULTURE WITHIN THE LAST 5 DAYS.     Note: Performed at [facility] Gram Stain Report Called to,Read Back By and Verified With: Lenward Chancellor RN ON 10/30/13 @0815  BY RESSEGGER R     Performed at [facility]   Report Status 11/01/2013 FINAL   Final  CULTURE, BLOOD (ROUTINE X 2)     Status: None   Collection Time    11/04/13  5:07 PM      Result Value Range Status   Specimen Description BLOOD RIGHT ARM   Final   Special Requests BOTTLES DRAWN AEROBIC ONLY 4CC   Final   Culture  Setup Time     Final   Value: 11/05/2013 01:21     Performed at [facility]   Culture     Final   Value: NO GROWTH 5 DAYS     Performed at [facility]   Report Status 11/11/2013 FINAL   Final  CULTURE, BLOOD (ROUTINE X 2)     Status: None   Collection Time    11/04/13  5:11 PM      Result Value Range Status   Specimen Description BLOOD LEFT ARM   Final   Special Requests BOTTLES DRAWN AEROBIC ONLY 3CC'   Final   Culture  Setup Time     Final   Value: 11/05/2013 00:39     Performed at [facility]   Culture     Final   Value: NO GROWTH 5 DAYS     Performed at [facility]   Report Status 11/11/2013 FINAL   Final  CULTURE, ROUTINE-ABSCESS     Status: None   Collection Time    11/05/13 10:19 AM      Result Value Range Status   Specimen Description ABSCESS   Final   Special Requests HEPATIC ABSCESS    Final   Gram Stain     Final   Value: ABUNDANT WBC PRESENT,BOTH PMN AND MONONUCLEAR     NO SQUAMOUS EPITHELIAL CELLS SEEN     FEW GRAM NEGATIVE RODS     Performed at [facility]   Culture     Final   Value: MULTIPLE  ORGANISMS PRESENT, NONE PREDOMINANT     Note: NO STAPHYLOCOCCUS AUREUS ISOLATED NO GROUP A STREP (S.PYOGENES) ISOLATED     Performed at [facility]   Report Status 11/08/2013 FINAL   Final  ANAEROBIC CULTURE     Status: None   Collection Time    11/05/13 10:19 AM      Result Value Range Status   Specimen Description ABSCESS   Final   Special Requests HEPATIC ABSCESS   Final   Gram Stain     Final   Value: ABUNDANT WBC PRESENT,BOTH PMN AND MONONUCLEAR     NO SQUAMOUS EPITHELIAL CELLS SEEN     FEW GRAM NEGATIVE RODS     Performed at [facility]   Culture     Final   Value: NO ANAEROBES ISOLATED     Performed at [facility]   Report Status 11/10/2013 FINAL   Final    Anti-infectives   Start     Dose/Rate Route Frequency Ordered Stop   11/05/13 1800  piperacillin-tazobactam (ZOSYN) IVPB 3.375 g     3.375 g 12.5 mL/hr over 240 Minutes Intravenous Every 8 hours 11/05/13 1654     11/05/13 1300  cefTRIAXone (ROCEPHIN) 2 g in dextrose 5 % 50 mL IVPB  Status:  Discontinued     2 g 100 mL/hr over 30 Minutes Intravenous Every 24 hours 11/04/13 1609 11/05/13 1647      Assessment: 67 y.o. F who continues on Zosyn for hepatic abscess with subsequent E.coli and Enterococcus bacteremia. ID on board -- wanting repeat CT for evaluation, will await LOT recommendations. Renal function stable, SCr 1.21, CrCl~40-50 ml/min. Dose remains appropriate.   Goal of Therapy:  Proper antibiotics for infection/cultures adjusted for renal/hepatic function   Plan:  1. Continue Zosyn 3.375g IV every 8 hours 2. Will continue to follow renal function, culture results, LOT, and antibiotic de-escalation plans   Georgina Pillion, PharmD, BCPS Clinical Pharmacist Pager: [phone] 11/11/2013 3:11 PM

## 2013-11-11 NOTE — Progress Notes (Signed)
   Assessment/Plan:  Principal Problem:   Liver abscess with E coli and enteroccccal bacteremia: s/p perc drain.  Abscess cx shows multiple orgs. Anaerobic negative. Discussed with ID.  rescan tomorrow, as 7 days out from drain. Active Problems:   HTN (hypertension)   Malignant neoplasm of pancreas, part unspecified   Antineoplastic chemotherapy induced anemia: monitor Hypokalemia, resolved DM 2: increase lantus 8 units qhs  Subjective: No complaints  Objective: Vital signs in last 24 hours: Temp:  [98.2 F (36.8 C)-98.5 F (36.9 C)] 98.5 F (36.9 C) (12/16 1335) Pulse Rate:  [76-90] 90 (12/16 1335) Resp:  [18-20] 18 (12/16 1335) BP: (119-121)/(65) 119/65 mmHg (12/16 1335) SpO2:  [93 %-100 %] 98 % (12/16 1335) Weight change:  Last BM Date: 11/11/13  Intake/Output from previous day: 12/15 0701 - 12/16 0700 In: 10  Out: 50 [Drains:50] Intake/Output this shift: Total I/O In: 1147 [I.V.:1137; Other:10] Out: 60 [Drains:60]  Gen: nontoxic. Comfortable Lungs CTA without WRR CV RRR without MGR Abd soft. Drain small amount tan clear.   Lab Results:  Recent Labs  11/11/13 0458  WBC 12.4*  HGB 7.7*  HCT 25.5*  PLT 566*   BMET  Recent Labs  11/11/13 0458  NA 136  K 4.2  CL 100  CO2 29  GLUCOSE 229*  BUN 11  CREATININE 1.21*  CALCIUM 8.4    Studies/Results: No results found.  Medications:  Scheduled Meds: . amLODipine  5 mg Oral Daily  . enoxaparin (LOVENOX) injection  40 mg Subcutaneous Q24H  . furosemide  40 mg Oral Daily  . insulin aspart  0-9 Units Subcutaneous TID WC  . insulin glargine  5 Units Subcutaneous QHS  . losartan  100 mg Oral Daily  . pantoprazole  40 mg Oral Daily  . piperacillin-tazobactam (ZOSYN)  IV  3.375 g Intravenous Q8H  . potassium chloride SA  40 mEq Oral Daily  . senna  1 tablet Oral Daily  . sodium chloride  10-40 mL Intracatheter Q12H  . sodium chloride  3 mL Intravenous Q12H   Continuous Infusions:  PRN  Meds:.sodium chloride, acetaminophen, acetaminophen, ALPRAZolam, alum & mag hydroxide-simeth, HYDROcodone-acetaminophen, ondansetron (ZOFRAN) IV, ondansetron, sodium chloride, sodium chloride, traZODone   LOS: 7 days   Sammy Cassar L 11/11/2013, 4:25 PM

## 2013-11-12 ENCOUNTER — Ambulatory Visit (HOSPITAL_COMMUNITY): Payer: Medicare Other

## 2013-11-12 ENCOUNTER — Inpatient Hospital Stay (HOSPITAL_COMMUNITY): Payer: Medicare Other

## 2013-11-12 LAB — GLUCOSE, CAPILLARY
Glucose-Capillary: 74 mg/dL (ref 70–99)
Glucose-Capillary: 99 mg/dL (ref 70–99)

## 2013-11-12 MED ORDER — IOHEXOL 300 MG/ML  SOLN
25.0000 mL | INTRAMUSCULAR | Status: AC
  Administered 2013-11-12 (×2): 25 mL via ORAL

## 2013-11-12 MED ORDER — IOHEXOL 300 MG/ML  SOLN
80.0000 mL | Freq: Once | INTRAMUSCULAR | Status: AC | PRN
  Administered 2013-11-12: 80 mL via INTRAVENOUS

## 2013-11-12 NOTE — Progress Notes (Signed)
Assessment/Plan:  Principal Problem:   Liver abscess with E coli and enteroccccal bacteremia: s/p perc drain. Discussed with Dr. Fredia Sorrow. Abscess essentially resolved.  Will arrange zosyn at home for another week and discharge home 12/18. Keep drain in and f/u ir as outpt Active Problems:   HTN (hypertension)   Malignant neoplasm of pancreas, part unspecified   Antineoplastic chemotherapy induced anemia: monitor Hypokalemia, resolved DM 2: increase lantus 8 units qhs  Subjective: No complaints  Objective: Vital signs in last 24 hours: Temp:  [98.3 F (36.8 C)-98.6 F (37 C)] 98.5 F (36.9 C) (12/17 1334) Pulse Rate:  [84-93] 88 (12/17 1334) Resp:  [16-18] 16 (12/17 1334) BP: (127-138)/(62-81) 138/81 mmHg (12/17 1334) SpO2:  [97 %-100 %] 100 % (12/17 1334) Weight:  [68.8 kg (151 lb 10.8 oz)] 68.8 kg (151 lb 10.8 oz) (12/17 0500) Weight change:  Last BM Date: 11/11/13  Intake/Output from previous day: 12/16 0701 - 12/17 0700 In: 1893.7 [P.O.:600; I.V.:1283.7] Out: 1010 [Urine:925; Drains:85] Intake/Output this shift:    Gen: nontoxic. Comfortable Lungs CTA without WRR CV RRR without MGR Abd soft. Drain small amount tan clear.   Lab Results:  Recent Labs  11/11/13 0458  WBC 12.4*  HGB 7.7*  HCT 25.5*  PLT 566*   BMET  Recent Labs  11/11/13 0458  NA 136  K 4.2  CL 100  CO2 29  GLUCOSE 229*  BUN 11  CREATININE 1.21*  CALCIUM 8.4    Studies/Results: Ct Abdomen W Contrast  11/12/2013   CLINICAL DATA:  Followup liver abscess status post percutaneous catheter drainage. Metastatic pancreatic carcinoma.  EXAM: CT ABDOMEN WITH CONTRAST  TECHNIQUE: Multidetector CT imaging of the abdomen was performed using the standard protocol following bolus administration of intravenous contrast.  CONTRAST:  80mL OMNIPAQUE IOHEXOL 300 MG/ML  SOLN  COMPARISON:  11/03/2013  FINDINGS: A percutaneous drainage catheter remains in place in the anterior segment right hepatic  lobe, with near complete resolution of the previously seen fluid collection at this site adjacent to the gallbladder fossa. There is a small residual fluid collection also seen in the anterior segment of the right hepatic lobe just superior and posterior to this lesion which measures 1.2 cm, and this is stable in size.  Other hypovascular lesions are also seen within both right and left hepatic lobes which are stable, and these could represent metastases or abscess ease. Diffuse biliary dilatation again seen with stent in the common bile duct. Soft tissue fullness in the pancreatic head surrounding the stent appears stable. Mild to moderate ascites is also stable.  Ill-defined soft tissue mass involving the pancreatic tail in upper pole the left kidney is stable. Retroperitoneal lymphadenopathy is seen in the left paraaortic space, with largest lymph node measuring 2.6 x 2.8 cm on image 32 compared to 2.4 x 2 point 6 cm previously. No other sites of lymphadenopathy identified. No evidence of dilated abdominal bowel loops.  IMPRESSION: Near complete resolution of cystic lesion in the anterior segment of the right hepatic lobe, adjacent to the gallbladder fossa, falling percutaneous catheter drainage. A smaller 1.2 cm fluid collection just posterior and superior to this lesion remains stable. Other hepatic metastases versus abscess ease are also stable.  Stable mass involving the pancreatic tail and upper pole of left kidney.  Stable soft tissue fullness involving the pancreatic head surrounding the the common bile duct stent.  Stable ascites.  Stable retroperitoneal lymphadenopathy, consistent metastatic disease.   Electronically Signed   By: Jonny Ruiz  Eppie Gibson M.D.   On: 11/12/2013 15:38    Medications:  Scheduled Meds: . amLODipine  5 mg Oral Daily  . enoxaparin (LOVENOX) injection  40 mg Subcutaneous Q24H  . furosemide  40 mg Oral Daily  . insulin aspart  0-9 Units Subcutaneous TID WC  . insulin glargine  8  Units Subcutaneous QHS  . losartan  100 mg Oral Daily  . pantoprazole  40 mg Oral Daily  . piperacillin-tazobactam (ZOSYN)  IV  3.375 g Intravenous Q8H  . potassium chloride SA  40 mEq Oral Daily  . senna  1 tablet Oral Daily  . sodium chloride  10-40 mL Intracatheter Q12H  . sodium chloride  3 mL Intravenous Q12H   Continuous Infusions:  PRN Meds:.sodium chloride, acetaminophen, acetaminophen, ALPRAZolam, alum & mag hydroxide-simeth, HYDROcodone-acetaminophen, ondansetron (ZOFRAN) IV, ondansetron, sodium chloride, sodium chloride, traZODone   LOS: 8 days   Damont Balles L 11/12/2013, 9:07 PM

## 2013-11-12 NOTE — Progress Notes (Signed)
INFECTIOUS DISEASE PROGRESS NOTE  ID: [name] is a 67 y.o. female with  Principal Problem:   Liver abscess Active Problems:   HTN (hypertension)   Malignant neoplasm of pancreas, part unspecified   Antineoplastic chemotherapy induced anemia(285.3)   Fever   Hepatic abscess   Insomnia  Subjective: Feels great.   Abtx:  Anti-infectives   Start     Dose/Rate Route Frequency Ordered Stop   11/05/13 1800  piperacillin-tazobactam (ZOSYN) IVPB 3.375 g     3.375 g 12.5 mL/hr over 240 Minutes Intravenous Every 8 hours 11/05/13 1654     11/05/13 1300  cefTRIAXone (ROCEPHIN) 2 g in dextrose 5 % 50 mL IVPB  Status:  Discontinued     2 g 100 mL/hr over 30 Minutes Intravenous Every 24 hours 11/04/13 1609 11/05/13 1647      Medications:  Scheduled: . amLODipine  5 mg Oral Daily  . enoxaparin (LOVENOX) injection  40 mg Subcutaneous Q24H  . furosemide  40 mg Oral Daily  . insulin aspart  0-9 Units Subcutaneous TID WC  . insulin glargine  8 Units Subcutaneous QHS  . losartan  100 mg Oral Daily  . pantoprazole  40 mg Oral Daily  . piperacillin-tazobactam (ZOSYN)  IV  3.375 g Intravenous Q8H  . potassium chloride SA  40 mEq Oral Daily  . senna  1 tablet Oral Daily  . sodium chloride  10-40 mL Intracatheter Q12H  . sodium chloride  3 mL Intravenous Q12H    Objective: Vital signs in last 24 hours: Temp:  [98.3 F (36.8 C)-98.6 F (37 C)] 98.5 F (36.9 C) (12/17 1334) Pulse Rate:  [84-93] 88 (12/17 1334) Resp:  [16-18] 16 (12/17 1334) BP: (127-138)/(62-81) 138/81 mmHg (12/17 1334) SpO2:  [97 %-100 %] 100 % (12/17 1334) Weight:  [68.8 kg (151 lb 10.8 oz)] 68.8 kg (151 lb 10.8 oz) (12/17 0500)   General appearance: alert, cooperative and no distress Resp: clear to auscultation bilaterally Cardio: regular rate and rhythm GI: normal findings: bowel sounds normal and soft, non-tender  Lab Results  Recent Labs  11/11/13 0458  WBC 12.4*  HGB 7.7*  HCT 25.5*  NA 136    K 4.2  CL 100  CO2 29  BUN 11  CREATININE 1.21*   Liver Panel No results found for this basename: PROT, ALBUMIN, AST, ALT, ALKPHOS, BILITOT, BILIDIR, IBILI,  in the last 72 hours Sedimentation Rate No results found for this basename: ESRSEDRATE,  in the last 72 hours C-Reactive Protein No results found for this basename: CRP,  in the last 72 hours  Microbiology: Recent Results (from the past 240 hour(s))  CULTURE, BLOOD (ROUTINE X 2)     Status: None   Collection Time    11/04/13  5:07 PM      Result Value Range Status   Specimen Description BLOOD RIGHT ARM   Final   Special Requests BOTTLES DRAWN AEROBIC ONLY 4CC   Final   Culture  Setup Time     Final   Value: 11/05/2013 01:21     Performed at [facility]   Culture     Final   Value: NO GROWTH 5 DAYS     Performed at [facility]   Report Status 11/11/2013 FINAL   Final  CULTURE, BLOOD (ROUTINE X 2)     Status: None   Collection Time    11/04/13  5:11 PM      Result Value Range Status  Specimen Description BLOOD LEFT ARM   Final   Special Requests BOTTLES DRAWN AEROBIC ONLY 3CC'   Final   Culture  Setup Time     Final   Value: 11/05/2013 00:39     Performed at [facility]   Culture     Final   Value: NO GROWTH 5 DAYS     Performed at [facility]   Report Status 11/11/2013 FINAL   Final  CULTURE, ROUTINE-ABSCESS     Status: None   Collection Time    11/05/13 10:19 AM      Result Value Range Status   Specimen Description ABSCESS   Final   Special Requests HEPATIC ABSCESS    Final   Gram Stain     Final   Value: ABUNDANT WBC PRESENT,BOTH PMN AND MONONUCLEAR     NO SQUAMOUS EPITHELIAL CELLS SEEN     FEW GRAM NEGATIVE RODS     Performed at [facility]   Culture     Final   Value: MULTIPLE ORGANISMS PRESENT, NONE PREDOMINANT     Note: NO STAPHYLOCOCCUS AUREUS ISOLATED NO GROUP A STREP (S.PYOGENES) ISOLATED     Performed at [facility]   Report Status  11/08/2013 FINAL   Final  ANAEROBIC CULTURE     Status: None   Collection Time    11/05/13 10:19 AM      Result Value Range Status   Specimen Description ABSCESS   Final   Special Requests HEPATIC ABSCESS   Final   Gram Stain     Final   Value: ABUNDANT WBC PRESENT,BOTH PMN AND MONONUCLEAR     NO SQUAMOUS EPITHELIAL CELLS SEEN     FEW GRAM NEGATIVE RODS     Performed at [facility]   Culture     Final   Value: NO ANAEROBES ISOLATED     Performed at [facility]   Report Status 11/10/2013 FINAL   Final    Studies/Results: Ct Abdomen W Contrast  11/12/2013   CLINICAL DATA:  Followup liver abscess status post percutaneous catheter drainage. Metastatic pancreatic carcinoma.  EXAM: CT ABDOMEN WITH CONTRAST  TECHNIQUE: Multidetector CT imaging of the abdomen was performed using the standard protocol following bolus administration of intravenous contrast.  CONTRAST:  80mL OMNIPAQUE IOHEXOL 300 MG/ML  SOLN  COMPARISON:  11/03/2013  FINDINGS: A percutaneous drainage catheter remains in place in the anterior segment right hepatic lobe, with near complete resolution of the previously seen fluid collection at this site adjacent to the gallbladder fossa. There is a small residual fluid collection also seen in the anterior segment of the right hepatic lobe just superior and posterior to this lesion which measures 1.2 cm, and this is stable in size.  Other hypovascular lesions are also seen within both right and left hepatic lobes which are stable, and these could represent metastases or abscess ease. Diffuse biliary dilatation again seen with stent in the common bile duct. Soft tissue fullness in the pancreatic head surrounding the stent appears stable. Mild to moderate ascites is also stable.  Ill-defined soft tissue mass involving the pancreatic tail in upper pole the left kidney is stable. Retroperitoneal lymphadenopathy is seen in the left paraaortic space, with largest lymph node  measuring 2.6 x 2.8 cm on image 32 compared to 2.4 x 2 point 6 cm previously. No other sites of lymphadenopathy identified. No evidence of dilated abdominal bowel loops.  IMPRESSION: Near complete resolution of cystic lesion in  the anterior segment of the right hepatic lobe, adjacent to the gallbladder fossa, falling percutaneous catheter drainage. A smaller 1.2 cm fluid collection just posterior and superior to this lesion remains stable. Other hepatic metastases versus abscess ease are also stable.  Stable mass involving the pancreatic tail and upper pole of left kidney.  Stable soft tissue fullness involving the pancreatic head surrounding the the common bile duct stent.  Stable ascites.  Stable retroperitoneal lymphadenopathy, consistent metastatic disease.   Electronically Signed   By: Myles Rosenthal M.D.   On: 11/12/2013 15:38     Assessment/Plan: Liver Abscess  Drain placed 12-10  Bacteremia (E coli R- amp/sulbactam;  enterococcus S- amp, vanco)  Stage IV pancreatic CA (liver and lung mets)   Total days of antibiotics: 14 (zosyn)  Drain out 85cc yesterday.  CT scan shows near complete resolution. Repeat BCx 12-9 are (-) final.   Would favor home with zosyn, plan to complete 1 more week of zosyn.  Will need IR f/u regarding when to have drain removed.          Johny Sax Infectious Diseases (pager) [phone] [email or web address] 11/12/2013, 4:21 PM  LOS: 8 days

## 2013-11-13 ENCOUNTER — Ambulatory Visit (HOSPITAL_COMMUNITY): Payer: Medicare Other

## 2013-11-13 LAB — GLUCOSE, CAPILLARY
Glucose-Capillary: 84 mg/dL (ref 70–99)
Glucose-Capillary: 145 mg/dL — ABNORMAL HIGH (ref 70–99)

## 2013-11-13 MED ORDER — PIPERACILLIN-TAZOBACTAM 3.375 G IVPB: 3.3750 g | Freq: Three times a day (TID) | INTRAVENOUS | Status: DC

## 2013-11-13 MED ORDER — INSULIN GLARGINE 100 UNIT/ML ~~LOC~~ SOLN: 5.0000 [IU] | Freq: Every day | SUBCUTANEOUS | Status: AC

## 2013-11-13 MED ORDER — HEPARIN SOD (PORK) LOCK FLUSH 100 UNIT/ML IV SOLN
500.0000 [IU] | INTRAVENOUS | Status: AC | PRN
  Administered 2013-11-13: 500 [IU]

## 2013-11-13 MED ORDER — ACETAMINOPHEN 325 MG PO TABS: 650.0000 mg | ORAL_TABLET | Freq: Four times a day (QID) | ORAL | Status: AC | PRN

## 2013-11-13 MED ORDER — OXYCODONE HCL 5 MG PO CAPS: 5.0000 mg | ORAL_CAPSULE | ORAL | Status: DC | PRN

## 2013-11-13 NOTE — Care Management Note (Signed)
  Page 1 of 1   11/13/2013     10:16:19 AM   CARE MANAGEMENT NOTE 11/13/2013  Patient:  [name], [name]   Account Number:  [id number]  Date Initiated:  11/13/2013  Documentation initiated by:  Ronny Flurry  Subjective/Objective Assessment:     Action/Plan:   Anticipated DC Date:  11/13/2013   Anticipated DC Plan:  HOME W HOME HEALTH SERVICES         Choice offered to / List presented to:  C-1 Patient        HH arranged  HH-1 RN      [facility] agency  [facility]   Status of service:  Completed, signed off Medicare Important Message given?   (If response is "NO", the following Medicare IM given date fields will be blank) Date Medicare IM given:   Date Additional Medicare IM given:    Discharge Disposition:  HOME W HOME HEALTH SERVICES  Per UR Regulation:    If discussed at Long Length of Stay Meetings, dates discussed:    Comments:  11-13-13 [facility] for IV Zoysn TID x 6 days and drain care . Patient's address is [address] , [place] . Ronny Flurry RN BSN [phone]

## 2013-11-13 NOTE — Discharge Summary (Signed)
Physician Discharge Summary  [name] ZOX:[id number] DOB: 12-11-1945 DOA: 11/04/2013  PCP: [name], MD  Admit date: 11/04/2013 Discharge date: 11/13/2013  Time spent: greater than 30 minutes  Recommendations for Outpatient Follow-up:  Monitor drain output. Flush drain with 5 cc sterile water daily IR follow up to manage drain and coordinate timing of discontinuation Home health arranged to assist with IV abx, drain care  Discharge Diagnoses:  Principal Problem:   Liver abscess Active Problems:   HTN (hypertension)   Malignant neoplasm of pancreas, part unspecified   Antineoplastic chemotherapy induced anemia(285.3) Bacteremia: Enterococus and  E coli prior to admission   Insomnia   Discharge Condition: stable  Filed Weights   11/09/13 0514 11/12/13 0500 11/13/13 0500  Weight: 70.761 kg (156 lb) 68.8 kg (151 lb 10.8 oz) 70.4 kg (155 lb 3.3 oz)    History of present illness: 67 y.o. female with a past medical history that includes diabetes, hypertension, pancreatic cancer with metastases to the liver, liver abscess, bacteremia presents to the [facility] due to liver abscess found on CT abdomen pelvis done for cancer staging. Information is obtained from the patient and the chart. Patient admitted to [facility] from 10/29/2013 to 11/01/2013 with fevers. During that hospitalization she was found to have bacteremia and Escherichia coli grew from the right antecubital and Escherichia coli and enterococcus species grew from Port-A-Cath. In addition Shigella group from GI pathogen panel of stool. Her Escherichia coli and enterococcus was identified to be sensitive to Cipro she was discharged on by mouth Cipro and Flagyl without a repeat blood culture proving clearance of bacteremia. She was discharged and underwent outpatient restaging scan ordered by oncology team at the [facility]. The CT scan report yields a greater than 4 cm intrahepatic  abscess. Per oncology due to the size of this abscess antibiotics would not likely be sufficient and GI was consulted. Dr. Karilyn Cota reviewed the scans and opined that patient would benefit from percutaneous drainage of abscess and recommended interventional radiology. Dellis Anes, placed the order for interventional radiology to drain abscess after speaking with Dr. Grace Isaac who agreed to see the patient. In addition IV antibiotics were recommended in an inpatient setting. Patient received 1 dose of Rocephin today prior to admission in the oncology clinic. Patient will be admitted to [facility] so infectious disease can also follow due to her complicated infections. Patient reports over the last 2 days since she was discharged to home she has had intermittent fevers. She states oral temperature max was 102.0. She denies any nausea vomiting diarrhea. She denies any chest pain palpitation shortness of breath headache visual disturbances. She denies dysuria hematuria frequency or urgency.  Hospital Course:  Admitted to hospitalist serviec at [facility].  ID and IR consulted. Started on zosyn.  Liver abscess was drained percutaneously, and JP drain placed. After 7 days, repeat CT showed near resolution of abscess.  WBC improved. Patient had minimal pain.  Patient will go home with another 7 days zosyn per portacath. Monitor drain output daily.  IR will call patient to schedule appointment.    Patient had hypoglycemia with home insulin regimen, so Levemir was decreased to 5 units nightly.  Other medical problems remained s  Consultations:  IR   ID  Procedures:  percutaneous drainage of liver abscess with placement of drain  Discharge Exam: Filed Vitals:   11/13/13 0623  BP: 128/69  Pulse: 88  Temp: 98.2 F (36.8 C)  Resp:  20    General: nontoxic Abd: s, nt, nd.  Bulb with 20 cc clear tan  Discharge Instructions  Discharge Orders   Future Appointments Provider Department Dept Phone   11/17/2013  9:45 AM [facility] Team B [facility] [phone]   11/24/2013 11:30 AM [facility] Chair 7 [facility] [phone]   Future Orders Complete By Expires   Activity as tolerated - No restrictions  As directed    Change dressing (specify)  As directed    Comments:     Dressing change: as needed   Diet Carb Modified  As directed    Discharge instructions  As directed    Comments:     Flush drain with 5 ml sterile water daily. Record daily output (minus flush amount) and record   May shower / Bathe  As directed    Scheduling Instructions:     Keep drain site clean and dry. May shower       Medication List    STOP taking these medications       ciprofloxacin 500 MG tablet  Commonly known as:  CIPRO     HYDROcodone-acetaminophen 7.5-325 MG per tablet  Commonly known as:  NORCO     metroNIDAZOLE 500 MG tablet  Commonly known as:  FLAGYL      TAKE these medications       acetaminophen 325 MG tablet  Commonly known as:  TYLENOL  Take 2 tablets (650 mg total) by mouth every 6 (six) hours as needed for mild pain (or Fever >/= 101).     ALPRAZolam 1 MG tablet  Commonly known as:  XANAX  Take 1 mg by mouth 3 (three) times daily as needed for sleep or anxiety.     amLODipine 5 MG tablet  Commonly known as:  NORVASC  Take 5 mg by mouth daily.     dexamethasone 4 MG tablet  Commonly known as:  DECADRON  Take 1 tablet in the am and 1 tablet in the pm x 2 days after chemo.     furosemide 40 MG tablet  Commonly known as:  LASIX  Take 1 tablet (40 mg total) by mouth daily.     gabapentin 300 MG capsule  Commonly known as:  NEURONTIN  Take 1 tab at HS x 3 days, then 2 tab at HS x 3 days, then 3 tabs at HS thereafter     insulin glargine 100 UNIT/ML injection  Commonly known as:  LANTUS  Inject 0.05 mLs (5 Units total) into the skin at bedtime.     losartan 100 MG tablet  Commonly known as:  COZAAR  Take 100 mg by mouth daily.     omeprazole 20 MG  capsule  Commonly known as:  PRILOSEC  Take 1 capsule (20 mg total) by mouth daily.     oxycodone 5 MG capsule  Commonly known as:  OXY-IR  Take 1 capsule (5 mg total) by mouth every 4 (four) hours as needed for pain.     piperacillin-tazobactam 3.375 GM/50ML IVPB  Commonly known as:  ZOSYN  - Inject 50 mLs (3.375 g total) into the vein every 8 (eight) hours. Through 11/19/13, then stop  -   - Quantity: QS     potassium chloride SA 20 MEQ tablet  Commonly known as:  K-DUR,KLOR-CON  Take 1 tablet (20 mEq total) by mouth daily.     senna 8.6 MG Tabs tablet  Commonly known as:  SENOKOT  Take  2 tablets by mouth at bedtime as needed for mild constipation.       No Known Allergies     Follow-up Information   Follow up with interventional radiology department. (they will call you with appointment)        The results of significant diagnostics from this hospitalization (including imaging, microbiology, ancillary and laboratory) are listed below for reference.    Significant Diagnostic Studies: Dg Chest 2 View  10/29/2013   CLINICAL DATA:  Cough, shortness of breath.  EXAM: CHEST  2 VIEW  COMPARISON:  July 27, 2013.  FINDINGS: Stable cardiomediastinal silhouette. Left subclavian Port-A-Cath is again noted with tip unchanged in position. Osteophyte formation is seen involving middle and lower thoracic spine. No pleural effusion or pneumothorax is noted. No acute pulmonary disease is noted.  IMPRESSION: No acute cardiopulmonary abnormality seen.   Electronically Signed   By: Roque Lias M.D.   On: 10/29/2013 16:34   Ct Chest W Contrast  11/03/2013   CLINICAL DATA:  Metastatic pancreatic cancer, left lower quadrant pain  EXAM: CT CHEST, ABDOMEN, AND PELVIS WITH CONTRAST  TECHNIQUE: Multidetector CT imaging of the chest, abdomen and pelvis was performed following the standard protocol during bolus administration of intravenous contrast.  CONTRAST:  80mL OMNIPAQUE IOHEXOL 300 MG/ML   SOLN  COMPARISON:  CT abdomen pelvis dated 07/28/2013. CT chest dated 05/09/2013.  FINDINGS: CT CHEST FINDINGS  Mild linear scarring at the medial right lung base (series 3/image 50). Prior right lower lobe pulmonary nodule is no longer well visualized.  Linear scarring in the right middle lobe and lingula. Moderate centrilobular emphysematous changes, upper lobe predominant. Trace bilateral pleural effusions. No pneumothorax.  Left thyroid is mildly nodular/heterogeneous.  Mild cardiomegaly. Trace pericardial effusion. Mild atherosclerotic calcifications of the aortic arch.  Small thoracic lymph nodes which do not meet pathologic CT size criteria, including:  --6 mm short axis prevascular node (series 2/image 21)  --7 mm short axis left axillary node (series 2/image 21)  --8 mm short axis subcarinal node (series 2/image 30)  Degenerative changes of the thoracic spine.  CT ABDOMEN AND PELVIS FINDINGS  Multifocal hepatic metastases, mildly improved, including:  --3.5 x 2.6 cm metastasis in the anterior segment right hepatic lobe (series 2/ image 49), previously 4.5 x 3.5 cm  --2.7 x 2.6 cm metastasis in the posterior segment right hepatic dome (series 2/ image 51), previously 3.3 x 4.0 cm  --2.4 x 2.5 cm metastasis in the medial segment left hepatic lobe (series 2/ image 59), previously 3.5 x 3.8 cm.  Interval development of an irregular 4.6 x 4.4 cm multiloculated fluid density cystic lesion within the anterior segment right hepatic lobe and encroaching upon the gallbladder fossa (series 2/ images 67 and 70). This appearance is worrisome for a possible hepatic abscess.  Pneumobilia, predominantly within the left hepatic lobe. Indwelling metallic internal biliary stent.  Ill-defined pancreatic tail mass measuring approximately 3.7 x 6.2 cm (series 2/ image 59), previously 5.0 x 6.6 cm. Enhancing soft tissue component extends inferiorly and involves the left kidney (series 2/image 53). Suspected involvement of the  splenic hilum with heterogeneous perfusion of the spleen. Overall, this appearance is improved.  Gallbladder is grossly unremarkable. No intrahepatic or extrahepatic ductal dilatation.  Bilateral adrenal glands and right kidney are grossly unremarkable.  2.3 cm left para-aortic nodal metastasis (series 2/image 70), grossly unchanged.  Moderate abdominopelvic ascites, malignant. Associated omental soft tissue beneath the left anterior abdominal wall with discrete 10 x 12 mm  implant (series 2/ image 75).  No evidence of bowel obstruction.  Mild atherosclerotic calcifications of the abdominal aorta.  Status post hysterectomy.  Bilateral ovaries are unremarkable.  Bladder is underdistended.  Body wall edema.  Mild degenerative changes at L5-S1.  IMPRESSION: 6.2 cm ill-defined pancreatic tail mass, decreased. Involvement of the left kidney and spleen.  Multifocal hepatic metastases, mildly improved. 2.3 cm left para-aortic nodal metastasis, unchanged. Moderate abdominopelvic ascites, malignant, with associated peritoneal nodularity.  Prior medial right lower lobe metastasis has essentially resolved with residual scarring.  4.6 cm complex fluid density lesion in the anterior segment right hepatic lobe, new, worrisome for hepatic abscess.  These results were called by telephone at the time of interpretation on 11/03/2013 at 1:08 PM to [facility], who verbally acknowledged these results.   Electronically Signed   By: Charline Bills M.D.   On: 11/03/2013 13:10   Ct Abdomen W Contrast  11/12/2013   CLINICAL DATA:  Followup liver abscess status post percutaneous catheter drainage. Metastatic pancreatic carcinoma.  EXAM: CT ABDOMEN WITH CONTRAST  TECHNIQUE: Multidetector CT imaging of the abdomen was performed using the standard protocol following bolus administration of intravenous contrast.  CONTRAST:  80mL OMNIPAQUE IOHEXOL 300 MG/ML  SOLN  COMPARISON:  11/03/2013  FINDINGS: A percutaneous drainage catheter remains  in place in the anterior segment right hepatic lobe, with near complete resolution of the previously seen fluid collection at this site adjacent to the gallbladder fossa. There is a small residual fluid collection also seen in the anterior segment of the right hepatic lobe just superior and posterior to this lesion which measures 1.2 cm, and this is stable in size.  Other hypovascular lesions are also seen within both right and left hepatic lobes which are stable, and these could represent metastases or abscess ease. Diffuse biliary dilatation again seen with stent in the common bile duct. Soft tissue fullness in the pancreatic head surrounding the stent appears stable. Mild to moderate ascites is also stable.  Ill-defined soft tissue mass involving the pancreatic tail in upper pole the left kidney is stable. Retroperitoneal lymphadenopathy is seen in the left paraaortic space, with largest lymph node measuring 2.6 x 2.8 cm on image 32 compared to 2.4 x 2 point 6 cm previously. No other sites of lymphadenopathy identified. No evidence of dilated abdominal bowel loops.  IMPRESSION: Near complete resolution of cystic lesion in the anterior segment of the right hepatic lobe, adjacent to the gallbladder fossa, falling percutaneous catheter drainage. A smaller 1.2 cm fluid collection just posterior and superior to this lesion remains stable. Other hepatic metastases versus abscess ease are also stable.  Stable mass involving the pancreatic tail and upper pole of left kidney.  Stable soft tissue fullness involving the pancreatic head surrounding the the common bile duct stent.  Stable ascites.  Stable retroperitoneal lymphadenopathy, consistent metastatic disease.   Electronically Signed   By: Myles Rosenthal M.D.   On: 11/12/2013 15:38   Ct Abdomen Pelvis W Contrast  11/03/2013   CLINICAL DATA:  Metastatic pancreatic cancer, left lower quadrant pain  EXAM: CT CHEST, ABDOMEN, AND PELVIS WITH CONTRAST  TECHNIQUE:  Multidetector CT imaging of the chest, abdomen and pelvis was performed following the standard protocol during bolus administration of intravenous contrast.  CONTRAST:  80mL OMNIPAQUE IOHEXOL 300 MG/ML  SOLN  COMPARISON:  CT abdomen pelvis dated 07/28/2013. CT chest dated 05/09/2013.  FINDINGS: CT CHEST FINDINGS  Mild linear scarring at the medial right lung base (series 3/image  50). Prior right lower lobe pulmonary nodule is no longer well visualized.  Linear scarring in the right middle lobe and lingula. Moderate centrilobular emphysematous changes, upper lobe predominant. Trace bilateral pleural effusions. No pneumothorax.  Left thyroid is mildly nodular/heterogeneous.  Mild cardiomegaly. Trace pericardial effusion. Mild atherosclerotic calcifications of the aortic arch.  Small thoracic lymph nodes which do not meet pathologic CT size criteria, including:  --6 mm short axis prevascular node (series 2/image 21)  --7 mm short axis left axillary node (series 2/image 21)  --8 mm short axis subcarinal node (series 2/image 30)  Degenerative changes of the thoracic spine.  CT ABDOMEN AND PELVIS FINDINGS  Multifocal hepatic metastases, mildly improved, including:  --3.5 x 2.6 cm metastasis in the anterior segment right hepatic lobe (series 2/ image 49), previously 4.5 x 3.5 cm  --2.7 x 2.6 cm metastasis in the posterior segment right hepatic dome (series 2/ image 51), previously 3.3 x 4.0 cm  --2.4 x 2.5 cm metastasis in the medial segment left hepatic lobe (series 2/ image 59), previously 3.5 x 3.8 cm.  Interval development of an irregular 4.6 x 4.4 cm multiloculated fluid density cystic lesion within the anterior segment right hepatic lobe and encroaching upon the gallbladder fossa (series 2/ images 67 and 70). This appearance is worrisome for a possible hepatic abscess.  Pneumobilia, predominantly within the left hepatic lobe. Indwelling metallic internal biliary stent.  Ill-defined pancreatic tail mass measuring  approximately 3.7 x 6.2 cm (series 2/ image 59), previously 5.0 x 6.6 cm. Enhancing soft tissue component extends inferiorly and involves the left kidney (series 2/image 53). Suspected involvement of the splenic hilum with heterogeneous perfusion of the spleen. Overall, this appearance is improved.  Gallbladder is grossly unremarkable. No intrahepatic or extrahepatic ductal dilatation.  Bilateral adrenal glands and right kidney are grossly unremarkable.  2.3 cm left para-aortic nodal metastasis (series 2/image 70), grossly unchanged.  Moderate abdominopelvic ascites, malignant. Associated omental soft tissue beneath the left anterior abdominal wall with discrete 10 x 12 mm implant (series 2/ image 75).  No evidence of bowel obstruction.  Mild atherosclerotic calcifications of the abdominal aorta.  Status post hysterectomy.  Bilateral ovaries are unremarkable.  Bladder is underdistended.  Body wall edema.  Mild degenerative changes at L5-S1.  IMPRESSION: 6.2 cm ill-defined pancreatic tail mass, decreased. Involvement of the left kidney and spleen.  Multifocal hepatic metastases, mildly improved. 2.3 cm left para-aortic nodal metastasis, unchanged. Moderate abdominopelvic ascites, malignant, with associated peritoneal nodularity.  Prior medial right lower lobe metastasis has essentially resolved with residual scarring.  4.6 cm complex fluid density lesion in the anterior segment right hepatic lobe, new, worrisome for hepatic abscess.  These results were called by telephone at the time of interpretation on 11/03/2013 at 1:08 PM to [facility], who verbally acknowledged these results.   Electronically Signed   By: Charline Bills M.D.   On: 11/03/2013 13:10   Ct Image Guided Drainage Percut Cath  Peritoneal Retroperit  11/05/2013   CLINICAL DATA:  History of metastatic pancreatic carcinoma to the liver. There is clinical evidence of infection with CT demonstrating a new liquefied area in the inferior liver  suspicious for hepatic abscess. The patient presents for needle aspiration with possible drainage catheter placement.  EXAM: CT GUIDED DRAINAGE OF HEPATIC ABSCESS  ANESTHESIA/SEDATION: 1.0 Mg IV Versed 25 mcg IV Fentanyl  Total Moderate Sedation Time:  23 minutes  PROCEDURE: The procedure, risks, benefits, and alternatives were explained to the patient. Questions regarding the  procedure were encouraged and answered. The patient understands and consents to the procedure.  The anterior right abdominal wall was prepped with Betadinein a sterile fashion, and a sterile drape was applied covering the operative field. A sterile gown and sterile gloves were used for the procedure. Local anesthesia was provided with 1% Lidocaine.  CT was performed of the liver in a supine position. After localizing the abnormal fluid collection in the inferior right lobe, an 18 gauge needle was advanced under CT guidance into the collection. Fluid aspiration was performed. A sample was sent for culture analysis.  A guidewire was advanced through the needle. The tract was dilated and a 10 French percutaneous drain advanced into the liver. Catheter positioning was confirmed by CT. The catheter was flushed and connected to a suction bulb. The catheter was secured at the skin with a Prolene retention suture and StatLock device.  COMPLICATIONS: None  FINDINGS: Aspiration at the level of the more liquefied collection in the inferior right lobe of the liver yielded grossly purulent fluid. A percutaneous drain was formed in the collection and is draining well after placement. Output will be followed.  IMPRESSION: CT-guided drainage of hepatic abscess in the inferior right lobe. A 10 French drainage catheter was placed and attached to a suction bulb.   Electronically Signed   By: Irish Lack M.D.   On: 11/05/2013 15:31    Microbiology: Recent Results (from the past 240 hour(s))  CULTURE, BLOOD (ROUTINE X 2)     Status: None   Collection  Time    11/04/13  5:07 PM      Result Value Range Status   Specimen Description BLOOD RIGHT ARM   Final   Special Requests BOTTLES DRAWN AEROBIC ONLY 4CC   Final   Culture  Setup Time     Final   Value: 11/05/2013 01:21     Performed at [facility]   Culture     Final   Value: NO GROWTH 5 DAYS     Performed at [facility]   Report Status 11/11/2013 FINAL   Final  CULTURE, BLOOD (ROUTINE X 2)     Status: None   Collection Time    11/04/13  5:11 PM      Result Value Range Status   Specimen Description BLOOD LEFT ARM   Final   Special Requests BOTTLES DRAWN AEROBIC ONLY 3CC'   Final   Culture  Setup Time     Final   Value: 11/05/2013 00:39     Performed at [facility]   Culture     Final   Value: NO GROWTH 5 DAYS     Performed at [facility]   Report Status 11/11/2013 FINAL   Final  CULTURE, ROUTINE-ABSCESS     Status: None   Collection Time    11/05/13 10:19 AM      Result Value Range Status   Specimen Description ABSCESS   Final   Special Requests HEPATIC ABSCESS    Final   Gram Stain     Final   Value: ABUNDANT WBC PRESENT,BOTH PMN AND MONONUCLEAR     NO SQUAMOUS EPITHELIAL CELLS SEEN     FEW GRAM NEGATIVE RODS     Performed at [facility]   Culture     Final   Value: MULTIPLE ORGANISMS PRESENT, NONE PREDOMINANT     Note: NO STAPHYLOCOCCUS AUREUS ISOLATED NO GROUP A STREP (S.PYOGENES) ISOLATED     Performed at  [facility]   Report Status 11/08/2013 FINAL   Final  ANAEROBIC CULTURE     Status: None   Collection Time    11/05/13 10:19 AM      Result Value Range Status   Specimen Description ABSCESS   Final   Special Requests HEPATIC ABSCESS   Final   Gram Stain     Final   Value: ABUNDANT WBC PRESENT,BOTH PMN AND MONONUCLEAR     NO SQUAMOUS EPITHELIAL CELLS SEEN     FEW GRAM NEGATIVE RODS     Performed at [facility]   Culture     Final   Value: NO ANAEROBES ISOLATED     Performed at [facility]   Report Status 11/10/2013 FINAL   Final     Labs: Basic Metabolic Panel:  Recent Labs Lab 11/07/13 0615 11/11/13 0458  NA 137 136  K 4.0 4.2  CL 100 100  CO2 27 29  GLUCOSE 107* 229*  BUN 7 11  CREATININE 1.28* 1.21*  CALCIUM 8.4 8.4   Liver Function Tests: No results found for this basename: AST, ALT, ALKPHOS, BILITOT, PROT, ALBUMIN,  in the last 168 hours No results found for this basename: LIPASE, AMYLASE,  in the last 168 hours No results found for this basename: AMMONIA,  in the last 168 hours CBC:  Recent Labs Lab 11/07/13 0615 11/11/13 0458  WBC 14.8* 12.4*  HGB 8.1* 7.7*  HCT 26.6* 25.5*  MCV 84.7 86.7  PLT 465* 566*   Cardiac Enzymes: No results found for this basename: CKTOTAL, CKMB, CKMBINDEX, TROPONINI,  in the last 168 hours BNP: BNP (last 3 results) No results found for this basename: PROBNP,  in the last 8760 hours CBG:  Recent Labs Lab 11/12/13 0735 11/12/13 1133 11/12/13 1639 11/12/13 2130 11/13/13 0742  GLUCAP 81 74 87 99 84       Signed:  Lachae Hohler L  [facility] 11/13/2013, 10:25 AM

## 2013-11-13 NOTE — Progress Notes (Signed)
Case has been discussed today with Dr. Bonnielee Haff, recent CT images have been reviewed. We will schedule a CT abdomen without contrast in 1 week for drain evaluation and possible removal. I have discussed this with Dr. Lendell Caprice who is aware. We will contact the patient with the CT date and time once scheduled.   Pattricia Boss PA-C Interventional Radiology  11/13/13  1:43 PM

## 2013-11-14 ENCOUNTER — Other Ambulatory Visit: Payer: Self-pay | Admitting: Radiology

## 2013-11-14 DIAGNOSIS — R188 Other ascites: Secondary | ICD-10-CM

## 2013-11-16 NOTE — Progress Notes (Signed)
Autumn Obey, MD [address] [place] [address]  Malignant neoplasm of pancreas, part unspecified - Plan: HYDROcodone-acetaminophen (NORCO) 7.5-325 MG per tablet, Cancer antigen 19-9, Comprehensive metabolic panel, CBC with Differential  Vaginal candidiasis - Plan: fluconazole (DIFLUCAN) 100 MG tablet  Antineoplastic chemotherapy induced anemia(285.3) - Plan: SCHEDULING COMMUNICATION INJECTION, darbepoetin alfa-polysorbate (ARANESP) injection 500 mcg, SCHEDULING COMMUNICATION LAB  CURRENT THERAPY: S/P 3 cycles of salvage gemcitabine/Abraxane beginning on 08/18/2013.  Day 15 of chemotherapy has been deleted from all cycles due to thrombocytopenia.   INTERVAL HISTORY: [name] 67 y.o. female returns for  regular  visit for followup of Metastatic pancreatic cancer S/P 5 cycles of FOLFIRINOX chemotherapy with stable disease per CT scans, but recurrent ascites worrisome for progression of disease. She was treated with FOLFIRINOX + Neulasta support from 05/26/2013- 07/21/2013 with a change in therapy to Gemcitabine/Abraxane D1, 8, 15 every 28 days beginning on 08/18/2013. Due to thrombocytopenia for Day 15 of cycle 1, Day 15 of therapy is deleted from all subsequent cycles.  Restaging CT scans following cycle 3 of Gemcitabine/Abraxane demonstrate stable to improved disease.       Malignant neoplasm of pancreas, part unspecified   05/12/2013 Initial Diagnosis Malignant neoplasm of pancreas, adenocarcinoma.  Liver biopsy   05/26/2013 - 07/21/2013 Chemotherapy FOLFIRINOX chemotherapy.  5 cycles with stable disease per CT scans, but recurrent malignant ascites worrisome for progression.   07/22/2013 Progression Recurrent ascites   08/18/2013 -  Chemotherapy Abraxane/Gemcitabine D1 + D8 every 28 days.  Day 15 cancelled and deleted due to thrombocytopenia for Day 15 of cycle 1.   11/04/2013 - 11/13/2013 Hospital Admission Percutaneous drainage of intrahepatic abscess by IR.       According to Dr. Pincus Sanes discharged note from 11/13/2013: Patient admitted to [facility] from 10/29/2013 to 11/01/2013 with fevers. During that hospitalization she was found to have bacteremia and Escherichia coli grew from the right antecubital and Escherichia coli and enterococcus species grew from Port-A-Cath. In addition Shigella group from GI pathogen panel of stool. Her Escherichia coli and enterococcus was identified to be sensitive to Cipro she was discharged on by mouth Cipro and Flagyl without a repeat blood culture proving clearance of bacteremia. She was discharged and underwent outpatient restaging scan ordered by oncology team at the [facility]. The CT scan report yields a greater than 4 cm intrahepatic abscess. Per oncology due to the size of this abscess antibiotics would not likely be sufficient and GI was consulted. Dr. Karilyn Cota reviewed the scans and opined that patient would benefit from percutaneous drainage of abscess and recommended interventional radiology. Dellis Anes, placed the order for interventional radiology to drain abscess after speaking with Dr. Grace Isaac who agreed to see the patient. In addition IV antibiotics were recommended in an inpatient setting. Patient received 1 dose of Rocephin today prior to admission in the oncology clinic. Patient will be admitted to [facility] so infectious disease can also follow due to her complicated infections. Patient reports over the last 2 days since she was discharged to home she has had intermittent fevers. She states oral temperature max was 102.0. She denies any nausea vomiting diarrhea. She denies any chest pain palpitation shortness of breath headache visual disturbances. She denies dysuria hematuria frequency or urgency.   Admitted to hospitalist serviec at [facility]. ID and IR consulted. Started on zosyn. Liver abscess was drained percutaneously, and JP drain placed. After 7 days, repeat CT showed near  resolution of abscess. WBC improved. Patient had minimal pain. Patient will go home with another 7 days zosyn per portacath. Monitor drain output daily. IR will call patient to schedule appointment.   Labs from hospitalization on 12/16 were reviewed with the patient.  I personally reviewed and went over laboratory results with the patient.  The results follow below.   I personally reviewed and went over radiographic studies with the patient.  CT scan of abd/pelvis following percutaneous drainage of hepatic cyst on 12/17 demonstrates near complete resolution of cystic lesion in the right hepatic lobe with stable metastatic disease.   I will hold chemotherapy until after the Holidays as she has a percutaneous drain in place.  Chemotherapy would increase her risk of infection.    Autumn reports increased itching with Oxycodone.  She reports that she has held the medication for a few days and the itching has improved.  She tolerated Hydrocodone in the past so I will give her an Rx for this pain medication.  It does have Tylenol in it, so I have limited her Tylenol to 3250 mg in 24 hours.   She also notes burning and itching in her "private parts."  She reports that it is red and erythematous.  She denies any discharge.  I suspect she has vaginal candidiasis in the setting of IV antibiotics.  I will treat empirically with Diflucan.  Otherwise, she denies any complaints and she is doing well.  ROS questioning is otherwise negative.    Past Medical History  Diagnosis Date  . Diabetes mellitus without complication   . Hypertension   . Anxiety   . Arthritis   . Pancreatic cancer   . 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 Peripheral neuropathy 10/13/2013  . Liver abscess 11/04/2013    has Obstructive jaundice; Tobacco abuse; HTN (hypertension); Diabetes mellitus; Malignant neoplasm of pancreas, part unspecified; Antineoplastic chemotherapy induced anemia(285.3); Peripheral  neuropathy; Fever; SIRS (systemic inflammatory response syndrome); Liver abscess; Hepatic abscess; and Insomnia on her problem list.     is allergic to oxycodone hcl.  Ms. Bonilla had no medications administered during this visit.  Past Surgical History  Procedure Laterality Date  . Abdominal hysterectomy    . Colonoscopy  2006    Dr. Jena Gauss, polyp, benign  . Ercp  05/10/2013    Procedure: ENDOSCOPIC RETROGRADE CHOLANGIOPANCREATOGRAPHY (ERCP)(DIFFICULT CANNULATION) ;  Surgeon: Malissa Hippo, MD;  Location: [facility];  Service: Endoscopy;;  . Biliary stent placement  05/10/2013    Procedure: BILIARY STENT PLACEMENT ( X WALLFLEX STENT);  Surgeon: Malissa Hippo, MD;  Location: [facility];  Service: Endoscopy;;  . Sphincterotomy  05/10/2013    Procedure: SPHINCTEROTOMY (PRECUT MADE WITH NEEDLE KNIFE);  Surgeon: Malissa Hippo, MD;  Location: [facility];  Service: Endoscopy;;  . Liver biopsy  05/12/2013  . Portacath placement Left 05/23/2013    Procedure: INSERTION PORT-A-CATH;  Surgeon: Fabio Bering, MD;  Location: [facility];  Service: General;  Laterality: Left;    Denies any headaches, dizziness, double vision, fevers, chills, night sweats, nausea, vomiting, diarrhea, constipation, chest pain, heart palpitations, shortness of breath, blood in stool, black tarry stool, urinary pain, urinary burning, urinary frequency, hematuria.   PHYSICAL EXAMINATION  ECOG PERFORMANCE STATUS: 1 - Symptomatic but completely ambulatory  Filed Vitals:   11/17/13 0929  BP: 156/97  Pulse: 97  Temp: 98.4 F (36.9 C)    GENERAL:alert, no distress, well nourished, well developed, comfortable, cooperative and smiling SKIN: skin color,  texture, turgor are normal, no rashes or significant lesions HEAD: Normocephalic, No masses, lesions, tenderness or abnormalities EYES: normal, PERRLA, EOMI, Conjunctiva are pink and non-injected EARS: External ears normal OROPHARYNX:mucous membranes are moist  NECK: supple,  trachea midline LYMPH:  not examined BREAST:not examined LUNGS: clear to auscultation  HEART: regular rate & rhythm, no murmurs, no gallops, S1 normal and S2 normal ABDOMEN:abdomen soft, non-tender, normal bowel sounds and percutaneous drain noted with clear yellow/brown fluid. BACK: Back symmetric, no curvature., No CVA tenderness EXTREMITIES:less then 2 second capillary refill, no joint deformities, effusion, or inflammation, no edema, no skin discoloration, no clubbing, no cyanosis  NEURO: alert & oriented x 3 with fluent speech, no focal motor/sensory deficits, gait normal    LABORATORY DATA: CBC    Component Value Date/Time   WBC 12.4* 11/11/2013 0458   RBC 2.94* 11/11/2013 0458   RBC 2.71* 07/28/2013 0557   HGB 7.7* 11/11/2013 0458   HCT 25.5* 11/11/2013 0458   PLT 566* 11/11/2013 0458   MCV 86.7 11/11/2013 0458   MCH 26.2 11/11/2013 0458   MCHC 30.2 11/11/2013 0458   RDW 20.2* 11/11/2013 0458   LYMPHSABS 2.4 11/04/2013 1630   MONOABS 3.3* 11/04/2013 1630   EOSABS 0.2 11/04/2013 1630   BASOSABS 0.0 11/04/2013 1630      Chemistry      Component Value Date/Time   NA 136 11/11/2013 0458   K 4.2 11/11/2013 0458   CL 100 11/11/2013 0458   CO2 29 11/11/2013 0458   BUN 11 11/11/2013 0458   CREATININE 1.21* 11/11/2013 0458      Component Value Date/Time   CALCIUM 8.4 11/11/2013 0458   ALKPHOS 123* 11/04/2013 1630   AST 12 11/04/2013 1630   ALT 10 11/04/2013 1630   BILITOT 0.3 11/04/2013 1630       RADIOGRAPHIC STUDIES:  11/12/13  CLINICAL DATA: Followup liver abscess status post percutaneous  catheter drainage. Metastatic pancreatic carcinoma.  EXAM:  CT ABDOMEN WITH CONTRAST  TECHNIQUE:  Multidetector CT imaging of the abdomen was performed using the  standard protocol following bolus administration of intravenous  contrast.  CONTRAST: 80mL OMNIPAQUE IOHEXOL 300 MG/ML SOLN  COMPARISON: 11/03/2013  FINDINGS:  A percutaneous drainage catheter remains in place in  the anterior  segment right hepatic lobe, with near complete resolution of the  previously seen fluid collection at this site adjacent to the  gallbladder fossa. There is a small residual fluid collection also  seen in the anterior segment of the right hepatic lobe just superior  and posterior to this lesion which measures 1.2 cm, and this is  stable in size.  Other hypovascular lesions are also seen within both right and left  hepatic lobes which are stable, and these could represent metastases  or abscess ease. Diffuse biliary dilatation again seen with stent in  the common bile duct. Soft tissue fullness in the pancreatic head  surrounding the stent appears stable. Mild to moderate ascites is  also stable.  Ill-defined soft tissue mass involving the pancreatic tail in upper  pole the left kidney is stable. Retroperitoneal lymphadenopathy is  seen in the left paraaortic space, with largest lymph node measuring  2.6 x 2.8 cm on image 32 compared to 2.4 x 2 point 6 cm previously.  No other sites of lymphadenopathy identified. No evidence of dilated  abdominal bowel loops.  IMPRESSION:  Near complete resolution of cystic lesion in the anterior segment of  the right hepatic lobe, adjacent to the gallbladder  fossa, falling  percutaneous catheter drainage. A smaller 1.2 cm fluid collection  just posterior and superior to this lesion remains stable. Other  hepatic metastases versus abscess ease are also stable.  Stable mass involving the pancreatic tail and upper pole of left  kidney.  Stable soft tissue fullness involving the pancreatic head  surrounding the the common bile duct stent.  Stable ascites.  Stable retroperitoneal lymphadenopathy, consistent metastatic  disease.  Electronically Signed  By: Myles Rosenthal M.D.  On: 11/12/2013 15:38     ASSESSMENT:  1. Metastatic pancreatic cancer S/P 5 cycles of FOLFIRINOX chemotherapy with stable disease per CT scans, but recurrent ascites  worrisome for progression of disease. She was treated with FOLFIRINOX + Neulasta support from 05/26/2013- 07/21/2013 with a change in therapy to Gemcitabine/Abraxane D1, 8, 15 every 28 days beginning on 08/18/2013. Due to thrombocytopenia for Day 15 of cycle 1, Day 15 of therapy is deleted from all subsequent cycles  2. Peripheral neuropathy, grade 2. Abraxane dose reduced by 25% and Gabapentin 900 mg daily started on 10/12/2013. 3. Intrahepatic abscess requiring IR percutaneous drainage on 11/05/2013.  Near complete resolution on repeat CT scan on 12/17. 4. Vaginal candidiasis 5. Abdominal pain secondary to #1, mild 6. Increased itching with Oxycodone, patient reported  Patient Active Problem List   Diagnosis Date Noted  . Insomnia 11/07/2013  . Liver abscess 11/04/2013  . Hepatic abscess 11/04/2013  . Fever 10/29/2013  . SIRS (systemic inflammatory response syndrome) 10/29/2013  . Peripheral neuropathy 10/13/2013  . 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 Malignant neoplasm of pancreas, part unspecified 05/21/2013  . Obstructive jaundice 05/07/2013  . Tobacco abuse 05/07/2013  . HTN (hypertension) 05/07/2013  . Diabetes mellitus 05/07/2013     PLAN:  1. I personally reviewed and went over laboratory results with the patient. 2. I personally reviewed and went over radiographic studies with the patient. 3. Defer chemotherapy until Dec 01, 2013 as she has a percutaneous drain.  4. Treatment plan altered accordingly.  5. Labs today: CBC diff, CMET, CA 19-9 6. Rx for Hydrocodone #60, Max of 10 tablets in 24 hours due to Tylenol in the setting of hepatic mets. 7. Allergies updated to include Oxycodone which she reports has caused itching.  Oxycodone D/C's from list. 8. Rx for Diflucan 200 mg today and 100 mg daily thereafter x 5-7 days. 9. Follow-up with IR as scheduled 10. Aranesp 500 mcg injection today 11. Zosyn IV as ordered 12. Return in 2 weeks for follow-up  and anticipate cycle 4 of chemotherapy.    THERAPY PLAN:  I will defer chemotherapy until 12/01/2012 due to percutaneous drain.  Chemotherapy would certainly increase her risk for infection while drain is in place.  We will administer Aranesp today for a Hgb of 7.7 g/dL on 47/82.  We will perform labs today.    All questions were answered. The patient knows to call the clinic with any problems, questions or concerns. We can certainly see the patient much sooner if necessary.  Patient and plan discussed with Dr. Alla German and he is in agreement with the aforementioned.   Sven Pinheiro

## 2013-11-17 ENCOUNTER — Encounter (HOSPITAL_BASED_OUTPATIENT_CLINIC_OR_DEPARTMENT_OTHER): Payer: Medicare Other | Admitting: Oncology

## 2013-11-17 ENCOUNTER — Inpatient Hospital Stay (HOSPITAL_COMMUNITY): Payer: Medicare Other

## 2013-11-17 VITALS — BP 156/97 | HR 97 | Temp 98.4°F | Wt 150.5 lb

## 2013-11-17 DIAGNOSIS — C259 Malignant neoplasm of pancreas, unspecified: Secondary | ICD-10-CM

## 2013-11-17 DIAGNOSIS — D6481 Anemia due to antineoplastic chemotherapy: Secondary | ICD-10-CM

## 2013-11-17 DIAGNOSIS — C252 Malignant neoplasm of tail of pancreas: Secondary | ICD-10-CM

## 2013-11-17 DIAGNOSIS — K750 Abscess of liver: Secondary | ICD-10-CM

## 2013-11-17 DIAGNOSIS — B373 Candidiasis of vulva and vagina: Secondary | ICD-10-CM

## 2013-11-17 LAB — COMPREHENSIVE METABOLIC PANEL
ALT: 9 U/L (ref 0–35)
Albumin: 2.4 g/dL — ABNORMAL LOW (ref 3.5–5.2)
CO2: 25 mEq/L (ref 19–32)
Calcium: 9.3 mg/dL (ref 8.4–10.5)
Creatinine, Ser: 0.99 mg/dL (ref 0.50–1.10)
GFR calc Af Amer: 68 mL/min — ABNORMAL LOW (ref >90)
GFR calc non Af Amer: 59 mL/min — ABNORMAL LOW (ref >90)
Glucose, Bld: 196 mg/dL — ABNORMAL HIGH (ref 70–99)
Sodium: 138 mEq/L (ref 135–145)
Total Protein: 8.7 g/dL — ABNORMAL HIGH (ref 6.0–8.3)

## 2013-11-17 LAB — CBC WITH DIFFERENTIAL/PLATELET
Eosinophils Absolute: 0.2 10*3/uL (ref 0.0–0.7)
Eosinophils Relative: 2 % (ref 0–5)
HCT: 29 % — ABNORMAL LOW (ref 36.0–46.0)
Hemoglobin: 8.7 g/dL — ABNORMAL LOW (ref 12.0–15.0)
Lymphocytes Relative: 19 % (ref 12–46)
Lymphs Abs: 2 10*3/uL (ref 0.7–4.0)
MCHC: 30 g/dL (ref 30.0–36.0)
Monocytes Absolute: 1 10*3/uL (ref 0.1–1.0)
Monocytes Relative: 9 % (ref 3–12)
Neutro Abs: 7.6 10*3/uL (ref 1.7–7.7)
Neutrophils Relative %: 70 % (ref 43–77)
RBC: 3.38 MIL/uL — ABNORMAL LOW (ref 3.87–5.11)

## 2013-11-17 MED ORDER — HEPARIN SOD (PORK) LOCK FLUSH 100 UNIT/ML IV SOLN
INTRAVENOUS | Status: AC
  Filled 2013-11-17: qty 5

## 2013-11-17 MED ORDER — HYDROCODONE-ACETAMINOPHEN 7.5-325 MG PO TABS: 1.0000 | ORAL_TABLET | ORAL | Status: DC | PRN

## 2013-11-17 MED ORDER — HEPARIN SOD (PORK) LOCK FLUSH 100 UNIT/ML IV SOLN
500.0000 [IU] | Freq: Once | INTRAVENOUS | Status: AC
  Administered 2013-11-17: 500 [IU] via INTRAVENOUS

## 2013-11-17 MED ORDER — FLUCONAZOLE 100 MG PO TABS: 100.0000 mg | ORAL_TABLET | Freq: Every day | ORAL | Status: DC

## 2013-11-17 MED ORDER — SODIUM CHLORIDE 0.9 % IJ SOLN
10.0000 mL | INTRAMUSCULAR | Status: DC | PRN
  Administered 2013-11-17: 10 mL via INTRAVENOUS

## 2013-11-17 MED ORDER — DARBEPOETIN ALFA-POLYSORBATE 500 MCG/ML IJ SOLN
500.0000 ug | Freq: Once | INTRAMUSCULAR | Status: AC
  Administered 2013-11-17: 500 ug via SUBCUTANEOUS
  Filled 2013-11-17: qty 1

## 2013-11-17 NOTE — Addendum Note (Signed)
Addended by: Edythe Lynn A on: 11/17/2013 10:30 AM   Modules accepted: Orders, SmartSet

## 2013-11-17 NOTE — Progress Notes (Signed)
[name] presented for Portacath lab draw and flush.  Portacath located lt chest wall access in place.Sluggish blood return and flushed easily Portacath flushed with 20ml NS and 500U/56ml Heparin and needle removed intact. Procedure without incident. Patient tolerated procedure well. [name] presents today for injection per MD orders. Aranesp 500 mcg administered SQ in left Abdomen. Administration without incident. Patient tolerated well.

## 2013-11-17 NOTE — Patient Instructions (Signed)
.  [facility] Discharge Instructions  RECOMMENDATIONS MADE BY THE CONSULTANT AND ANY TEST RESULTS WILL BE SENT TO YOUR REFERRING PHYSICIAN.  EXAM FINDINGS BY THE PHYSICIAN TODAY AND SIGNS OR SYMPTOMS TO REPORT TO CLINIC OR PRIMARY PHYSICIAN: Exam and findings as discussed by T. Jacalyn Lefevre PA. Chemo after the holidays--Jan 5th MEDICATIONS PRESCRIBED:  Diflucan 2 today then 1 daily for 4-6 days. Save the remainder Refill on hydrocodone--Max of 10 tabs a day aranesp today INSTRUCTIONS/FOLLOW-UP: Cbc diff ca 19-9 cmet today 2 weeks  Thank you for choosing [facility] to provide your oncology and hematology care.  To afford each patient quality time with our providers, please arrive at least 15 minutes before your scheduled appointment time.  With your help, our goal is to use those 15 minutes to complete the necessary work-up to ensure our physicians have the information they need to help with your evaluation and healthcare recommendations.    Effective January 1st, 2014, we ask that you re-schedule your appointment with our physicians should you arrive 10 or more minutes late for your appointment.  We strive to give you quality time with our providers, and arriving late affects you and other patients whose appointments are after yours.    Again, thank you for choosing [facility].  Our hope is that these requests will decrease the amount of time that you wait before being seen by our physicians.       _____________________________________________________________  Should you have questions after your visit to [facility], please contact our office at [phone] between the hours of 8:30 a.m. and 5:00 p.m.  Voicemails left after 4:30 p.m. will not be returned until the following business day.  For prescription refill requests, have your pharmacy contact our office with your prescription refill request.

## 2013-11-18 ENCOUNTER — Telehealth (HOSPITAL_COMMUNITY): Payer: Self-pay

## 2013-11-18 LAB — CANCER ANTIGEN 19-9: CA 19-9: 35110 U/mL — ABNORMAL HIGH (ref <35.0)

## 2013-11-18 NOTE — ED Notes (Signed)
Spoke w/pts granddaughter drain for liver abscess is leaking.  Pt is followed by [facility] and she has seen pt  Today but they are unable to reach at this time.  Advised to keep trying to reach [facility] and transferred call to Interventional Radiology

## 2013-11-21 ENCOUNTER — Ambulatory Visit (HOSPITAL_COMMUNITY)
Admission: RE | Admit: 2013-11-21 | Discharge: 2013-11-21 | Disposition: A | Discharge status: Home / Self Care | Payer: Medicare Other | Source: Ambulatory Visit | Attending: Interventional Radiology | Admitting: Interventional Radiology

## 2013-11-21 ENCOUNTER — Other Ambulatory Visit (HOSPITAL_COMMUNITY): Payer: Self-pay | Admitting: Interventional Radiology

## 2013-11-21 ENCOUNTER — Ambulatory Visit (HOSPITAL_COMMUNITY)
Admission: RE | Admit: 2013-11-21 | Discharge: 2013-11-21 | Disposition: A | Discharge status: Home / Self Care | Payer: Medicare Other | Source: Ambulatory Visit | Attending: Radiology | Admitting: Radiology

## 2013-11-21 DIAGNOSIS — Z4682 Encounter for fitting and adjustment of non-vascular catheter: Secondary | ICD-10-CM | POA: Insufficient documentation

## 2013-11-21 DIAGNOSIS — K651 Peritoneal abscess: Secondary | ICD-10-CM

## 2013-11-21 DIAGNOSIS — R188 Other ascites: Secondary | ICD-10-CM

## 2013-11-21 NOTE — Progress Notes (Signed)
Abcess drain removed by Dr Bonnielee Haff.  Large amounts of peritoneal fluid draining out of drain hole.  Urostomy bag obtained from CS.  Placed by Michele Rockers, RN.  Teaching done about how to drain bag using sterile technique.  Dr Bonnielee Haff wants pt to watch drainage over the weekend.  If not stopped by Monday she is to call clinic.  Phone number given.  Pt and grandaughter verbalize understanding.  Tolerated well.

## 2013-11-24 ENCOUNTER — Ambulatory Visit (HOSPITAL_COMMUNITY)
Admission: RE | Admit: 2013-11-24 | Discharge: 2013-11-24 | Disposition: A | Discharge status: Home / Self Care | Payer: Medicare Other | Source: Ambulatory Visit | Attending: Oncology | Admitting: Oncology

## 2013-11-24 ENCOUNTER — Ambulatory Visit (HOSPITAL_COMMUNITY): Payer: Medicare Other

## 2013-11-24 ENCOUNTER — Other Ambulatory Visit (HOSPITAL_COMMUNITY): Payer: Self-pay | Admitting: Oncology

## 2013-11-24 ENCOUNTER — Encounter (HOSPITAL_COMMUNITY): Payer: Self-pay

## 2013-11-24 DIAGNOSIS — C259 Malignant neoplasm of pancreas, unspecified: Secondary | ICD-10-CM

## 2013-11-24 DIAGNOSIS — R188 Other ascites: Secondary | ICD-10-CM | POA: Insufficient documentation

## 2013-11-24 NOTE — Progress Notes (Signed)
Paracentesis complete no signs of distress. 1100 ml abdominal fluid removed.

## 2013-11-28 ENCOUNTER — Other Ambulatory Visit (HOSPITAL_COMMUNITY): Payer: Self-pay | Admitting: Oncology

## 2013-11-28 DIAGNOSIS — C259 Malignant neoplasm of pancreas, unspecified: Secondary | ICD-10-CM

## 2013-11-28 MED ORDER — DEXAMETHASONE 4 MG PO TABS: ORAL_TABLET | ORAL | Status: DC

## 2013-12-01 ENCOUNTER — Encounter (HOSPITAL_COMMUNITY): Payer: Medicare Other | Attending: Oncology

## 2013-12-01 ENCOUNTER — Ambulatory Visit (HOSPITAL_COMMUNITY)
Admission: RE | Admit: 2013-12-01 | Discharge: 2013-12-01 | Disposition: A | Discharge status: Home / Self Care | Payer: Medicare Other | Source: Ambulatory Visit | Attending: Oncology | Admitting: Oncology

## 2013-12-01 VITALS — BP 143/90 | HR 104 | Temp 97.6°F | Resp 20 | Wt 145.2 lb

## 2013-12-01 DIAGNOSIS — C259 Malignant neoplasm of pancreas, unspecified: Secondary | ICD-10-CM

## 2013-12-01 DIAGNOSIS — Z5111 Encounter for antineoplastic chemotherapy: Secondary | ICD-10-CM

## 2013-12-01 DIAGNOSIS — T451X5A Adverse effect of antineoplastic and immunosuppressive drugs, initial encounter: Secondary | ICD-10-CM

## 2013-12-01 DIAGNOSIS — D6481 Anemia due to antineoplastic chemotherapy: Secondary | ICD-10-CM | POA: Insufficient documentation

## 2013-12-01 DIAGNOSIS — R188 Other ascites: Secondary | ICD-10-CM | POA: Insufficient documentation

## 2013-12-01 DIAGNOSIS — C252 Malignant neoplasm of tail of pancreas: Secondary | ICD-10-CM

## 2013-12-01 DIAGNOSIS — E119 Type 2 diabetes mellitus without complications: Secondary | ICD-10-CM | POA: Insufficient documentation

## 2013-12-01 LAB — COMPREHENSIVE METABOLIC PANEL
ALBUMIN: 2.5 g/dL — AB (ref 3.5–5.2)
ALK PHOS: 212 U/L — AB (ref 39–117)
ALT: 18 U/L (ref 0–35)
AST: 22 U/L (ref 0–37)
BUN: 14 mg/dL (ref 6–23)
CO2: 26 mEq/L (ref 19–32)
Calcium: 9.4 mg/dL (ref 8.4–10.5)
Chloride: 101 mEq/L (ref 96–112)
Creatinine, Ser: 0.82 mg/dL (ref 0.50–1.10)
GFR calc Af Amer: 85 mL/min — ABNORMAL LOW (ref >90)
GFR calc non Af Amer: 73 mL/min — ABNORMAL LOW (ref >90)
Glucose, Bld: 215 mg/dL — ABNORMAL HIGH (ref 70–99)
POTASSIUM: 4 meq/L (ref 3.7–5.3)
SODIUM: 138 meq/L (ref 137–147)
TOTAL PROTEIN: 7.9 g/dL (ref 6.0–8.3)
Total Bilirubin: 0.4 mg/dL (ref 0.3–1.2)

## 2013-12-01 LAB — CBC WITH DIFFERENTIAL/PLATELET
Basophils Absolute: 0 10*3/uL (ref 0.0–0.1)
Basophils Relative: 0 % (ref 0–1)
EOS ABS: 0.6 10*3/uL (ref 0.0–0.7)
Eosinophils Relative: 5 % (ref 0–5)
HCT: 34.7 % — ABNORMAL LOW (ref 36.0–46.0)
Hemoglobin: 10.5 g/dL — ABNORMAL LOW (ref 12.0–15.0)
LYMPHS ABS: 2 10*3/uL (ref 0.7–4.0)
Lymphocytes Relative: 17 % (ref 12–46)
MCH: 26.2 pg (ref 26.0–34.0)
MCHC: 30.3 g/dL (ref 30.0–36.0)
MCV: 86.5 fL (ref 78.0–100.0)
Monocytes Absolute: 1.2 10*3/uL — ABNORMAL HIGH (ref 0.1–1.0)
Monocytes Relative: 10 % (ref 3–12)
NEUTROS ABS: 8 10*3/uL — AB (ref 1.7–7.7)
NEUTROS PCT: 68 % (ref 43–77)
Platelets: 285 10*3/uL (ref 150–400)
RBC: 4.01 MIL/uL (ref 3.87–5.11)
RDW: 19.6 % — ABNORMAL HIGH (ref 11.5–15.5)
WBC: 11.8 10*3/uL — ABNORMAL HIGH (ref 4.0–10.5)

## 2013-12-01 MED ORDER — PACLITAXEL PROTEIN-BOUND CHEMO INJECTION 100 MG
93.7500 mg/m2 | Freq: Once | INTRAVENOUS | Status: AC
  Administered 2013-12-01: 175 mg via INTRAVENOUS
  Filled 2013-12-01: qty 35

## 2013-12-01 MED ORDER — SODIUM CHLORIDE 0.9 % IJ SOLN
10.0000 mL | INTRAMUSCULAR | Status: DC | PRN
  Administered 2013-12-01: 10 mL

## 2013-12-01 MED ORDER — SODIUM CHLORIDE 0.9 % IV SOLN
Freq: Once | INTRAVENOUS | Status: AC
  Administered 2013-12-01: 12:00:00 via INTRAVENOUS

## 2013-12-01 MED ORDER — SODIUM CHLORIDE 0.9 % IV SOLN
Freq: Once | INTRAVENOUS | Status: AC
  Administered 2013-12-01: 8 mg via INTRAVENOUS
  Filled 2013-12-01: qty 4

## 2013-12-01 MED ORDER — HEPARIN SOD (PORK) LOCK FLUSH 100 UNIT/ML IV SOLN
500.0000 [IU] | Freq: Once | INTRAVENOUS | Status: AC | PRN
  Administered 2013-12-01: 500 [IU]

## 2013-12-01 MED ORDER — DEXAMETHASONE SODIUM PHOSPHATE 10 MG/ML IJ SOLN: 10.0000 mg | Freq: Once | INTRAMUSCULAR | Status: DC

## 2013-12-01 MED ORDER — SODIUM CHLORIDE 0.9 % IV SOLN: 8.0000 mg | Freq: Once | INTRAVENOUS | Status: DC

## 2013-12-01 MED ORDER — HYDROCODONE-ACETAMINOPHEN 7.5-325 MG PO TABS: 1.0000 | ORAL_TABLET | ORAL | Status: DC | PRN

## 2013-12-01 MED ORDER — SODIUM CHLORIDE 0.9 % IV SOLN
1000.0000 mg/m2 | Freq: Once | INTRAVENOUS | Status: AC
  Administered 2013-12-01: 1900 mg via INTRAVENOUS
  Filled 2013-12-01: qty 49.97

## 2013-12-01 NOTE — Progress Notes (Signed)
Paracentesis complete no signs of distress. 1500 ml green-yellow abdominal fluid removed.

## 2013-12-01 NOTE — Progress Notes (Signed)
[name] tolerated infusions well and without incident; verbalizes understanding for follow-up.  No distress noted at time of discharge and patient was discharged home by herself.  Tolerated paracentesis well.

## 2013-12-08 ENCOUNTER — Encounter (HOSPITAL_BASED_OUTPATIENT_CLINIC_OR_DEPARTMENT_OTHER): Payer: Medicare Other | Admitting: Oncology

## 2013-12-08 ENCOUNTER — Encounter (HOSPITAL_BASED_OUTPATIENT_CLINIC_OR_DEPARTMENT_OTHER): Payer: Medicare Other

## 2013-12-08 ENCOUNTER — Other Ambulatory Visit (HOSPITAL_COMMUNITY): Payer: Self-pay | Admitting: Oncology

## 2013-12-08 VITALS — BP 131/80 | HR 89 | Temp 97.5°F | Resp 16

## 2013-12-08 DIAGNOSIS — E119 Type 2 diabetes mellitus without complications: Secondary | ICD-10-CM

## 2013-12-08 DIAGNOSIS — C259 Malignant neoplasm of pancreas, unspecified: Secondary | ICD-10-CM

## 2013-12-08 DIAGNOSIS — R1012 Left upper quadrant pain: Secondary | ICD-10-CM

## 2013-12-08 DIAGNOSIS — Z5111 Encounter for antineoplastic chemotherapy: Secondary | ICD-10-CM

## 2013-12-08 DIAGNOSIS — R1011 Right upper quadrant pain: Secondary | ICD-10-CM

## 2013-12-08 DIAGNOSIS — K5900 Constipation, unspecified: Secondary | ICD-10-CM

## 2013-12-08 DIAGNOSIS — C252 Malignant neoplasm of tail of pancreas: Secondary | ICD-10-CM

## 2013-12-08 LAB — CBC WITH DIFFERENTIAL/PLATELET
Basophils Absolute: 0 10*3/uL (ref 0.0–0.1)
Basophils Relative: 0 % (ref 0–1)
EOS ABS: 0.3 10*3/uL (ref 0.0–0.7)
Eosinophils Relative: 3 % (ref 0–5)
HCT: 30.3 % — ABNORMAL LOW (ref 36.0–46.0)
Hemoglobin: 9.7 g/dL — ABNORMAL LOW (ref 12.0–15.0)
LYMPHS ABS: 1.1 10*3/uL (ref 0.7–4.0)
Lymphocytes Relative: 11 % — ABNORMAL LOW (ref 12–46)
MCH: 27.5 pg (ref 26.0–34.0)
MCHC: 32 g/dL (ref 30.0–36.0)
MCV: 85.8 fL (ref 78.0–100.0)
Monocytes Absolute: 0.7 10*3/uL (ref 0.1–1.0)
Monocytes Relative: 7 % (ref 3–12)
NEUTROS PCT: 78 % — AB (ref 43–77)
Neutro Abs: 7.3 10*3/uL (ref 1.7–7.7)
PLATELETS: 120 10*3/uL — AB (ref 150–400)
RBC: 3.53 MIL/uL — AB (ref 3.87–5.11)
RDW: 19 % — ABNORMAL HIGH (ref 11.5–15.5)
WBC: 9.5 10*3/uL (ref 4.0–10.5)

## 2013-12-08 LAB — COMPREHENSIVE METABOLIC PANEL
ALT: 14 U/L (ref 0–35)
AST: 14 U/L (ref 0–37)
Albumin: 2.3 g/dL — ABNORMAL LOW (ref 3.5–5.2)
Alkaline Phosphatase: 201 U/L — ABNORMAL HIGH (ref 39–117)
BILIRUBIN TOTAL: 0.4 mg/dL (ref 0.3–1.2)
BUN: 24 mg/dL — AB (ref 6–23)
CHLORIDE: 96 meq/L (ref 96–112)
CO2: 29 meq/L (ref 19–32)
CREATININE: 1.19 mg/dL — AB (ref 0.50–1.10)
Calcium: 8.5 mg/dL (ref 8.4–10.5)
GFR, EST AFRICAN AMERICAN: 54 mL/min — AB (ref >90)
GFR, EST NON AFRICAN AMERICAN: 47 mL/min — AB (ref >90)
GLUCOSE: 399 mg/dL — AB (ref 70–99)
Potassium: 4.1 mEq/L (ref 3.7–5.3)
Sodium: 134 mEq/L — ABNORMAL LOW (ref 137–147)
Total Protein: 6.9 g/dL (ref 6.0–8.3)

## 2013-12-08 LAB — GLUCOSE, RANDOM: GLUCOSE: 309 mg/dL — AB (ref 70–99)

## 2013-12-08 LAB — CANCER ANTIGEN 19-9: CA 19-9: 71148.2 U/mL — ABNORMAL HIGH (ref <35.0)

## 2013-12-08 MED ORDER — SODIUM CHLORIDE 0.9 % IV SOLN: 8.0000 mg | Freq: Once | INTRAVENOUS | Status: DC

## 2013-12-08 MED ORDER — SODIUM CHLORIDE 0.9 % IV SOLN
Freq: Once | INTRAVENOUS | Status: AC
  Administered 2013-12-08: 12:00:00 via INTRAVENOUS

## 2013-12-08 MED ORDER — HYDROCODONE-ACETAMINOPHEN 7.5-325 MG PO TABS
2.0000 | ORAL_TABLET | Freq: Four times a day (QID) | ORAL | Status: DC | PRN
  Administered 2013-12-08: 2 via ORAL
  Filled 2013-12-08: qty 2

## 2013-12-08 MED ORDER — HYDROCODONE-ACETAMINOPHEN 7.5-325 MG PO TABS: 1.0000 | ORAL_TABLET | ORAL | Status: DC | PRN

## 2013-12-08 MED ORDER — INSULIN ASPART 100 UNIT/ML ~~LOC~~ SOLN
8.0000 [IU] | Freq: Once | SUBCUTANEOUS | Status: AC
  Administered 2013-12-08: 8 [IU] via SUBCUTANEOUS
  Filled 2013-12-08: qty 0.08

## 2013-12-08 MED ORDER — SODIUM CHLORIDE 0.9 % IJ SOLN: 10.0000 mL | INTRAMUSCULAR | Status: DC | PRN

## 2013-12-08 MED ORDER — SODIUM CHLORIDE 0.9 % IV SOLN
Freq: Once | INTRAVENOUS | Status: AC
  Administered 2013-12-08: 8 mg via INTRAVENOUS
  Filled 2013-12-08: qty 4

## 2013-12-08 MED ORDER — DEXAMETHASONE SODIUM PHOSPHATE 10 MG/ML IJ SOLN: 10.0000 mg | Freq: Once | INTRAMUSCULAR | Status: DC

## 2013-12-08 MED ORDER — SODIUM CHLORIDE 0.9 % IV SOLN
1000.0000 mg/m2 | Freq: Once | INTRAVENOUS | Status: AC
  Administered 2013-12-08: 1900 mg via INTRAVENOUS
  Filled 2013-12-08: qty 49.97

## 2013-12-08 MED ORDER — HEPARIN SOD (PORK) LOCK FLUSH 100 UNIT/ML IV SOLN
500.0000 [IU] | Freq: Once | INTRAVENOUS | Status: AC | PRN
  Administered 2013-12-08: 500 [IU]
  Filled 2013-12-08: qty 5

## 2013-12-08 MED ORDER — PACLITAXEL PROTEIN-BOUND CHEMO INJECTION 100 MG
93.7500 mg/m2 | Freq: Once | INTRAVENOUS | Status: AC
  Administered 2013-12-08: 175 mg via INTRAVENOUS
  Filled 2013-12-08: qty 35

## 2013-12-08 NOTE — Progress Notes (Addendum)
[name] is seen as a work-in today for increased abd pain.  She reports that the discomfort began over the weekend.  She reports that she has not have a BM in 2 days despite taking Senokot.  She reports that her discomfort is in her upper abdomen.  She is taking increased amounts of Norco as a result of the abdominal pain.  She denies any nausea or vomiting.  She denies any increased distension of abdomen.  She is S/P paracentesis last week.  There were no vitals taken for this visit. Gen: NAD, pleasant Skin: Wamr and dry Abd: RUQ and LUQ discomfort.  Hard to palpation, but not distended with softness in lower quadrants.  Mild increased discomfort on palpation of upper quadrants. Neuro: No focal deficits  Assessment: 1.  Upper quadrant abdominal pain suspect constipation induced versus disease progression. 2. Constipation, last BM 2 days ago. 3. Metastatic pancreatic cancer, on therapy (Gemzar/Abraxane)  Plan: 1. Continue with Senokot 2. MiraLax BID until BM.  Call clinic on Wednesday if no BM 3. Rx for Norco refill 4. Patient education regarding narcotics causing constipation. 5. Pre-chemo labs as scheduled 6. Return as scheduled  Patient and plan discussed with Dr. Farrel Gobble and he is in agreement with the aforementioned.   KEFALAS,THOMAS   Addendum:  Pre-chemo labs return:  CBC    Component Value Date/Time   WBC 9.5 12/08/2013 1020   RBC 3.53* 12/08/2013 1020   RBC 2.71* 07/28/2013 0557   HGB 9.7* 12/08/2013 1020   HCT 30.3* 12/08/2013 1020   PLT 120* 12/08/2013 1020   MCV 85.8 12/08/2013 1020   MCH 27.5 12/08/2013 1020   MCHC 32.0 12/08/2013 1020   RDW 19.0* 12/08/2013 1020   LYMPHSABS 1.1 12/08/2013 1020   MONOABS 0.7 12/08/2013 1020   EOSABS 0.3 12/08/2013 1020   BASOSABS 0.0 12/08/2013 1020      Chemistry      Component Value Date/Time   NA 134* 12/08/2013 1020   K 4.1 12/08/2013 1020   CL 96 12/08/2013 1020   CO2 29 12/08/2013 1020   BUN 24* 12/08/2013 1020   CREATININE 1.19* 12/08/2013 1020      Component Value Date/Time   CALCIUM 8.5 12/08/2013 1020   ALKPHOS 201* 12/08/2013 1020   AST 14 12/08/2013 1020   ALT 14 12/08/2013 1020   BILITOT 0.4 12/08/2013 1020      Patient will be given 8 units of novolog insulin with a recheck glucose in 1 hour.  She is to hold Dexamethasone following chemotherapy.   KEFALAS,THOMAS

## 2013-12-08 NOTE — Progress Notes (Signed)
[place] tolerated infusions well and without incident; verbalizes understanding for follow-up.  No distress noted at time of discharge and patient was discharged home by herself.  [name] also reported abd pain initially and meds given which pt states brought pain to 0/10.

## 2013-12-08 NOTE — Addendum Note (Signed)
Addended by: Baird Cancer on: 12/08/2013 12:06 PM   Modules accepted: Orders, Level of Service

## 2013-12-08 NOTE — Progress Notes (Signed)
Labs drawn today for cbc/diff,cmp,ca19-9 

## 2013-12-12 ENCOUNTER — Encounter (HOSPITAL_BASED_OUTPATIENT_CLINIC_OR_DEPARTMENT_OTHER): Payer: Medicare Other | Admitting: Oncology

## 2013-12-12 ENCOUNTER — Ambulatory Visit (HOSPITAL_COMMUNITY)
Admission: RE | Admit: 2013-12-12 | Discharge: 2013-12-12 | Disposition: A | Discharge status: Home / Self Care | Payer: Medicare Other | Source: Ambulatory Visit | Attending: Oncology | Admitting: Oncology

## 2013-12-12 VITALS — BP 146/86 | HR 116 | Temp 98.0°F | Resp 20 | Wt 142.7 lb

## 2013-12-12 DIAGNOSIS — C259 Malignant neoplasm of pancreas, unspecified: Secondary | ICD-10-CM

## 2013-12-12 DIAGNOSIS — R188 Other ascites: Secondary | ICD-10-CM | POA: Insufficient documentation

## 2013-12-12 DIAGNOSIS — C252 Malignant neoplasm of tail of pancreas: Secondary | ICD-10-CM

## 2013-12-12 MED ORDER — HYDROCODONE-ACETAMINOPHEN 7.5-325 MG PO TABS
1.0000 | ORAL_TABLET | Freq: Once | ORAL | Status: AC
  Administered 2013-12-12: 1 via ORAL
  Filled 2013-12-12: qty 1

## 2013-12-12 NOTE — Patient Instructions (Signed)
[facility] Discharge Instructions  RECOMMENDATIONS MADE BY THE CONSULTANT AND ANY TEST RESULTS WILL BE SENT TO YOUR REFERRING PHYSICIAN.  Paracentesis today. Return as scheduled for all appointments. Report any issues/concerns to clinic as needed prior to appointments.  Thank you for choosing [facility] to provide your oncology and hematology care.  To afford each patient quality time with our providers, please arrive at least 15 minutes before your scheduled appointment time.  With your help, our goal is to use those 15 minutes to complete the necessary work-up to ensure our physicians have the information they need to help with your evaluation and healthcare recommendations.    Effective January 1st, 2014, we ask that you re-schedule your appointment with our physicians should you arrive 10 or more minutes late for your appointment.  We strive to give you quality time with our providers, and arriving late affects you and other patients whose appointments are after yours.    Again, thank you for choosing [facility].  Our hope is that these requests will decrease the amount of time that you wait before being seen by our physicians.       _____________________________________________________________  Should you have questions after your visit to [facility], please contact our office at [phone] between the hours of 8:30 a.m. and 5:00 p.m.  Voicemails left after 4:30 p.m. will not be returned until the following business day.  For prescription refill requests, have your pharmacy contact our office with your prescription refill request.

## 2013-12-12 NOTE — Progress Notes (Signed)
[name] is seen as a work-in  She notes increased abdominal tightness with increased size/girth.  She notes increased abdominal pain as a result and decreased appetite.  She notes that Hydrocodone relieves the pain.  The pain is waxing and waning.  I have ordered Hydrocodone tablet today in the clinic as she notes it is effective for her.    Additionally, I have ordered an limited abd [place] with a therapeutic paracentesis if needed.   With the increased frequency of paracenteses, I suspect progression of disease.  However, her performance status is great and she otherwise looks and feels good.  Due to the lack of further chemotherapeutic options at this time, we will push forward with therapy as scheduled and see her back as scheduled.  We will continue with symptom management as well.  Naftali Carchi

## 2013-12-12 NOTE — Procedures (Signed)
PreOperative Dx: Pancreatic cancer, ascites Postoperative Dx: Pancreatic cancer, ascites Procedure:   US guided paracentesis Radiologist:  Thornton Papas Anesthesia:  10 ml of 1% lidocaine Specimen:  2200 ml of green colored ascitic fluid EBL:   < 1 ml Complications: None

## 2013-12-12 NOTE — Progress Notes (Signed)
Paracentesis complete no signs of distress. 2200 ml greenish colored abdominal fluid removed

## 2013-12-22 ENCOUNTER — Ambulatory Visit (HOSPITAL_COMMUNITY)
Admission: RE | Admit: 2013-12-22 | Discharge: 2013-12-22 | Disposition: A | Discharge status: Home / Self Care | Payer: Medicare Other | Source: Ambulatory Visit | Attending: Oncology | Admitting: Oncology

## 2013-12-22 ENCOUNTER — Encounter (HOSPITAL_BASED_OUTPATIENT_CLINIC_OR_DEPARTMENT_OTHER): Payer: Medicare Other

## 2013-12-22 ENCOUNTER — Encounter (HOSPITAL_COMMUNITY): Payer: Self-pay

## 2013-12-22 ENCOUNTER — Other Ambulatory Visit (HOSPITAL_COMMUNITY): Payer: Self-pay | Admitting: Oncology

## 2013-12-22 VITALS — BP 140/76 | HR 90 | Resp 18

## 2013-12-22 VITALS — BP 123/76 | HR 82 | Temp 97.4°F | Resp 16

## 2013-12-22 DIAGNOSIS — D6481 Anemia due to antineoplastic chemotherapy: Secondary | ICD-10-CM

## 2013-12-22 DIAGNOSIS — C259 Malignant neoplasm of pancreas, unspecified: Secondary | ICD-10-CM

## 2013-12-22 DIAGNOSIS — R188 Other ascites: Secondary | ICD-10-CM | POA: Insufficient documentation

## 2013-12-22 DIAGNOSIS — C9000 Multiple myeloma not having achieved remission: Secondary | ICD-10-CM

## 2013-12-22 DIAGNOSIS — T451X5A Adverse effect of antineoplastic and immunosuppressive drugs, initial encounter: Principal | ICD-10-CM

## 2013-12-22 LAB — CBC
HCT: 32.1 % — ABNORMAL LOW (ref 36.0–46.0)
Hemoglobin: 9.9 g/dL — ABNORMAL LOW (ref 12.0–15.0)
MCH: 26.3 pg (ref 26.0–34.0)
MCHC: 30.8 g/dL (ref 30.0–36.0)
MCV: 85.4 fL (ref 78.0–100.0)
Platelets: 489 10*3/uL — ABNORMAL HIGH (ref 150–400)
RBC: 3.76 MIL/uL — ABNORMAL LOW (ref 3.87–5.11)
RDW: 19.8 % — AB (ref 11.5–15.5)
WBC: 10.9 10*3/uL — AB (ref 4.0–10.5)

## 2013-12-22 MED ORDER — DARBEPOETIN ALFA-POLYSORBATE 500 MCG/ML IJ SOLN
INTRAMUSCULAR | Status: AC
  Filled 2013-12-22: qty 1

## 2013-12-22 MED ORDER — DARBEPOETIN ALFA-POLYSORBATE 500 MCG/ML IJ SOLN
500.0000 ug | Freq: Once | INTRAMUSCULAR | Status: AC
  Administered 2013-12-22: 500 ug via SUBCUTANEOUS

## 2013-12-22 MED ORDER — HYDROCODONE-ACETAMINOPHEN 10-325 MG PO TABS: 1.0000 | ORAL_TABLET | Freq: Four times a day (QID) | ORAL | Status: DC | PRN

## 2013-12-22 NOTE — Progress Notes (Signed)
[name] presents today for injection per MD orders. Aranesp 300 mcg administered SQ in left Abdomen. Administration without incident. Patient tolerated well.

## 2013-12-22 NOTE — Progress Notes (Signed)
Paracentesis complete no signs of distress. 2000 ml green/yellow abdominal fluid removed.

## 2013-12-22 NOTE — Procedures (Signed)
PreOperative Dx: Pancreatic cancer, ascites Postoperative Dx: Pancreatic cancer, ascites Procedure:   US guided paracentesis Radiologist:  Thornton Papas Anesthesia:  9 ml of 1% lidocaine Specimen:  2000 ml of yellow-green ascitic fluid EBL:   None Complications: None

## 2013-12-22 NOTE — Progress Notes (Signed)
Labs drawn today for cbc 

## 2013-12-23 ENCOUNTER — Other Ambulatory Visit (HOSPITAL_COMMUNITY): Payer: Self-pay | Admitting: Oncology

## 2013-12-23 ENCOUNTER — Other Ambulatory Visit (HOSPITAL_COMMUNITY): Payer: Medicare Other

## 2013-12-23 DIAGNOSIS — C259 Malignant neoplasm of pancreas, unspecified: Secondary | ICD-10-CM

## 2013-12-26 ENCOUNTER — Ambulatory Visit (HOSPITAL_COMMUNITY)
Admission: RE | Admit: 2013-12-26 | Discharge: 2013-12-26 | Disposition: A | Discharge status: Home / Self Care | Payer: Medicare Other | Source: Ambulatory Visit | Attending: Oncology | Admitting: Oncology

## 2013-12-26 ENCOUNTER — Other Ambulatory Visit (HOSPITAL_COMMUNITY): Payer: Self-pay | Admitting: Oncology

## 2013-12-26 ENCOUNTER — Telehealth (HOSPITAL_COMMUNITY): Payer: Self-pay | Admitting: *Deleted

## 2013-12-26 DIAGNOSIS — C259 Malignant neoplasm of pancreas, unspecified: Secondary | ICD-10-CM | POA: Insufficient documentation

## 2013-12-26 DIAGNOSIS — G629 Polyneuropathy, unspecified: Secondary | ICD-10-CM

## 2013-12-26 DIAGNOSIS — R188 Other ascites: Secondary | ICD-10-CM | POA: Insufficient documentation

## 2013-12-26 MED ORDER — GABAPENTIN 300 MG PO CAPS: 900.0000 mg | ORAL_CAPSULE | Freq: Every day | ORAL | Status: DC

## 2013-12-26 MED ORDER — DEXAMETHASONE 4 MG PO TABS: ORAL_TABLET | ORAL | Status: DC

## 2013-12-26 NOTE — Telephone Encounter (Signed)
Patient called to report that she needs to be tapped again. Richard called and appt time given of 1230 for a 1pm appt.  Patient notified

## 2013-12-29 ENCOUNTER — Other Ambulatory Visit (HOSPITAL_COMMUNITY): Payer: Medicare Other

## 2013-12-29 ENCOUNTER — Inpatient Hospital Stay (HOSPITAL_COMMUNITY): Payer: Medicare Other

## 2013-12-30 ENCOUNTER — Ambulatory Visit (HOSPITAL_COMMUNITY)
Admission: RE | Admit: 2013-12-30 | Discharge: 2013-12-30 | Disposition: A | Discharge status: Home / Self Care | Payer: Medicare Other | Source: Ambulatory Visit | Attending: Oncology | Admitting: Oncology

## 2013-12-30 ENCOUNTER — Ambulatory Visit (HOSPITAL_COMMUNITY): Payer: Medicare Other | Admitting: Oncology

## 2013-12-30 ENCOUNTER — Other Ambulatory Visit (HOSPITAL_COMMUNITY): Payer: Self-pay | Admitting: Oncology

## 2013-12-30 ENCOUNTER — Inpatient Hospital Stay (HOSPITAL_COMMUNITY): Payer: Medicare Other

## 2013-12-30 ENCOUNTER — Other Ambulatory Visit (HOSPITAL_COMMUNITY): Payer: Medicare Other

## 2013-12-30 ENCOUNTER — Encounter (HOSPITAL_COMMUNITY): Payer: Self-pay

## 2013-12-30 DIAGNOSIS — C787 Secondary malignant neoplasm of liver and intrahepatic bile duct: Secondary | ICD-10-CM | POA: Insufficient documentation

## 2013-12-30 DIAGNOSIS — C259 Malignant neoplasm of pancreas, unspecified: Secondary | ICD-10-CM

## 2013-12-30 DIAGNOSIS — C7800 Secondary malignant neoplasm of unspecified lung: Secondary | ICD-10-CM | POA: Insufficient documentation

## 2013-12-30 DIAGNOSIS — C786 Secondary malignant neoplasm of retroperitoneum and peritoneum: Secondary | ICD-10-CM | POA: Insufficient documentation

## 2013-12-30 DIAGNOSIS — C7889 Secondary malignant neoplasm of other digestive organs: Secondary | ICD-10-CM | POA: Insufficient documentation

## 2013-12-30 DIAGNOSIS — K219 Gastro-esophageal reflux disease without esophagitis: Secondary | ICD-10-CM

## 2013-12-30 DIAGNOSIS — R188 Other ascites: Secondary | ICD-10-CM | POA: Insufficient documentation

## 2013-12-30 DIAGNOSIS — C7900 Secondary malignant neoplasm of unspecified kidney and renal pelvis: Secondary | ICD-10-CM | POA: Insufficient documentation

## 2013-12-30 MED ORDER — IOHEXOL 300 MG/ML  SOLN
100.0000 mL | Freq: Once | INTRAMUSCULAR | Status: AC | PRN
  Administered 2013-12-30: 100 mL via INTRAVENOUS

## 2013-12-30 MED ORDER — OMEPRAZOLE 20 MG PO CPDR: 20.0000 mg | DELAYED_RELEASE_CAPSULE | Freq: Every day | ORAL | Status: AC

## 2013-12-30 NOTE — Progress Notes (Signed)
Robert Bellow, MD [address] [place] [address]  Malignant neoplasm of pancreas, part unspecified - Plan: HYDROcodone-acetaminophen ([place]) 10-325 MG per tablet, capecitabine (XELODA) 500 MG tablet  CURRENT THERAPY:S/P 4 cycles of salvage gemcitabine/Abraxane beginning on 08/18/2013. Day 15 of chemotherapy has been deleted from all cycles due to thrombocytopenia.  Last treated on 12/08/2013.  INTERVAL HISTORY: CHELCEY SHOLTZ 68 y.o. female returns for  regular  visit for followup of Metastatic pancreatic cancer S/P 5 cycles of FOLFIRINOX chemotherapy with stable disease per CT scans, but recurrent ascites worrisome for progression of disease. She was treated with FOLFIRINOX + Neulasta support from 05/26/2013- 07/21/2013 with a change in therapy to Gemcitabine/Abraxane D1, 8, 15 every 28 days beginning on 08/18/2013. Due to thrombocytopenia for Day 15 of cycle 1, Day 15 of therapy is deleted from all subsequent cycles. Restaging CT scans following cycle 3 of Gemcitabine/Abraxane demonstrate stable to improved disease.  After the [facility], she was noted to require weekly paracenteses and near the end of Jan 2015, she was needing paracenteses twice weekly.  Therefore, restaging CT scans were moved and completed on 12/30/2013.  CT scans demonstrate progression of disease.     Malignant neoplasm of pancreas, part unspecified   05/12/2013 Initial Diagnosis Malignant neoplasm of pancreas, adenocarcinoma.  Liver biopsy   05/26/2013 - 07/21/2013 Chemotherapy FOLFIRINOX chemotherapy.  5 cycles with stable disease per CT scans, but recurrent malignant ascites worrisome for progression.   07/22/2013 Progression Recurrent ascites   08/18/2013 - 12/08/2013 Chemotherapy Abraxane/Gemcitabine D1 + D8 every 28 days.  Day 15 cancelled and deleted due to thrombocytopenia for Day 15 of cycle 1.  Completed 4 cycles   11/04/2013 - 11/13/2013 Hospital Admission Percutaneous drainage of  intrahepatic abscess by IR.   11/24/2013 Progression Weekly paracenteses for malignant ascites becoming more frequent and nearly twice per week at the end of Jan 2015.   12/30/2013 Imaging CT CAP for restaging demonstrates progrssion of disease    I personally reviewed and went over radiographic studies with the patient.  The results are noted within this dictation.   I personally reviewed and went over laboratory results with the patient.  The results are noted within this dictation.  It is noted, her CA 19-9 has recently increased from 35,000 to over 71,000 between 11/17/2013 and 12/08/2013.   With progression of disease, she has the opportunity to pursue 3rd line therapy. We could try salvage Gemcitabine/Erlotinib or Xeloda, although results are not the best.  We reviewed NCCN guidelines for treatment and treatment options.  Since she maintains a good performance status, she is a candidate for further therapy as mentioned above.  Her other option is to transition to comfort care.  Either options is absolutely reasonable.  I spent time with the patient reviewing her options as mentioned above. Do to her performance status, she wishes to pursue chemotherapeutic intervention. She was provided education that this will be a salvage and palliative therapeutic intervention. She understands that her cancer is not curable, but she wishes to prolong her life at this point in time. There is not a lot of information regarding the aforementioned regimens in this setting, but gemcitabine/Erlotinib demonstrates a miniscule progression free survival of days and is extremely expensive. This regimen would require IV therapy as well with gemcitabine. Our other option is Xeloda alone.  This too has poor progression free survival, but his by mouth and can be taken at home, and is much  less expensive. This is the route she has chosen to take for her therapy. A prescription was written for the low to 1000 mg in the morning,  1000 mg in the evening, 7 days on and 7 days off. She was provided generalized information regarding Xeloda, side effects, risks, benefits, and alternatives. She will need to have chemotherapy teaching in the near future before starting this regimen and we will get this scheduled. I've written the prescription for the aforementioned regimen and provided to her prior authorization specialist, Lendell Caprice.  Despite her decision to pursue chemotherapy, we will continue to palliate her and manage side effects and toxicities of therapy. We will also continue to manage issues associated with her malignancy including paracenteses for malignant ascites.  I broached the subject of having a permanent catheter placed in the abdomen so she will be able to treat her ascites at home. She is fearful of consultations associated with this and also she is not sure she wants to do this at home. Therefore, we'll continue with ultrasound of abdomen and ultrasound-guided paracenteses as indicated here at the [facility].  She reports that her acid reflux is much improved since I restart her on her Prilosec. She's taking 20 mg daily with wonderful results. She is encouraged to continue this medication daily.  From an oncology standpoint, she denies any complaints of ROS questioning is negative. She reports that her abdomen feels good presently.  Past Medical History  Diagnosis Date  . Diabetes mellitus without complication   . Hypertension   . Anxiety   . Arthritis   . Pancreatic cancer   . 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 Peripheral neuropathy 10/13/2013  . Liver abscess 11/04/2013    has Obstructive jaundice; Tobacco abuse; HTN (hypertension); Diabetes mellitus; Malignant neoplasm of pancreas, part unspecified; Antineoplastic chemotherapy induced anemia(285.3); Peripheral neuropathy; Fever; SIRS (systemic inflammatory response syndrome); Hepatic abscess; and Insomnia on her  problem list.     is allergic to oxycodone hcl and levaquin.  Ms. Sellinger had no medications administered during this visit.  Past Surgical History  Procedure Laterality Date  . Abdominal hysterectomy    . Colonoscopy  2006    Dr. Gala Romney, polyp, benign  . Ercp  05/10/2013    Procedure: ENDOSCOPIC RETROGRADE CHOLANGIOPANCREATOGRAPHY (ERCP)(DIFFICULT CANNULATION) ;  Surgeon: Rogene Houston, MD;  Location: [facility];  Service: Endoscopy;;  . Biliary stent placement  05/10/2013    Procedure: BILIARY STENT PLACEMENT (10MM X 80MM WALLFLEX STENT);  Surgeon: Rogene Houston, MD;  Location: [facility];  Service: Endoscopy;;  . Sphincterotomy  05/10/2013    Procedure: SPHINCTEROTOMY (PRECUT MADE WITH NEEDLE KNIFE);  Surgeon: Rogene Houston, MD;  Location: [facility];  Service: Endoscopy;;  . Liver biopsy  05/12/2013  . Portacath placement Left 05/23/2013    Procedure: INSERTION PORT-A-CATH;  Surgeon: Donato Heinz, MD;  Location: [facility];  Service: General;  Laterality: Left;    Denies any headaches, dizziness, double vision, fevers, chills, night sweats, nausea, vomiting, diarrhea, constipation, chest pain, heart palpitations, shortness of breath, blood in stool, black tarry stool, urinary pain, urinary burning, urinary frequency, hematuria.   PHYSICAL EXAMINATION  ECOG PERFORMANCE STATUS: 1 - Symptomatic but completely ambulatory  Filed Vitals:   12/31/13 1034  BP: 143/85  Pulse: 97  Temp: 97.9 F (36.6 C)  Resp: 20    GENERAL:alert, no distress, well nourished, well developed, comfortable, cooperative and smiling SKIN: skin color, texture, turgor are normal, no rashes or  significant lesions HEAD: Normocephalic, No masses, lesions, tenderness or abnormalities EYES: normal, PERRLA, EOMI, Conjunctiva are pink and non-injected EARS: External ears normal OROPHARYNX:mucous membranes are moist  NECK: supple, trachea midline LYMPH:  not examined BREAST:not examined LUNGS: not examined HEART: not  examined ABDOMEN:not examined BACK: Back symmetric, no curvature. EXTREMITIES:less then 2 second capillary refill, no joint deformities, effusion, or inflammation, no skin discoloration, no clubbing, no cyanosis  NEURO: alert & oriented x 3 with fluent speech, no focal motor/sensory deficits, gait normal    LABORATORY DATA: CBC    Component Value Date/Time   WBC 10.9* 12/22/2013 1131   RBC 3.76* 12/22/2013 1131   RBC 2.71* 07/28/2013 0557   HGB 9.9* 12/22/2013 1131   HCT 32.1* 12/22/2013 1131   PLT 489* 12/22/2013 1131   MCV 85.4 12/22/2013 1131   MCH 26.3 12/22/2013 1131   MCHC 30.8 12/22/2013 1131   RDW 19.8* 12/22/2013 1131   LYMPHSABS 1.1 12/08/2013 1020   MONOABS 0.7 12/08/2013 1020   EOSABS 0.3 12/08/2013 1020   BASOSABS 0.0 12/08/2013 1020      Chemistry      Component Value Date/Time   NA 134* 12/08/2013 1020   K 4.1 12/08/2013 1020   CL 96 12/08/2013 1020   CO2 29 12/08/2013 1020   BUN 24* 12/08/2013 1020   CREATININE 1.19* 12/08/2013 1020      Component Value Date/Time   CALCIUM 8.5 12/08/2013 1020   ALKPHOS 201* 12/08/2013 1020   AST 14 12/08/2013 1020   ALT 14 12/08/2013 1020   BILITOT 0.4 12/08/2013 1020      Results for ALYZAH, TREVINO (MRN [id number]) as of 12/30/2013 17:56  Ref. Range 12/08/2013 10:20  CA 19-9 Latest Range: <35.0 U/mL 71148.2 (H)   Results for DARL, FULLERTON (MRN [id number]) as of 12/30/2013 17:56  Ref. Range 06/23/2013 09:13 07/04/2013 12:10 09/01/2013 08:42 10/13/2013 08:41 11/17/2013 10:04 12/08/2013 10:20  CA 19-9 Latest Range: <35.0 U/mL >140000.0 (H) >140000.0 (H) 130181.7 (H) 64006.2 (H) 35110.0 (H) 71148.2 (H)    RADIOGRAPHIC STUDIES:  Ct Abdomen Pelvis W Wo Contrast  12/30/2013   CLINICAL DATA:  Restaging metastatic pancreatic cancer.  EXAM: CT CHEST WITH CONTRAST  CT ABDOMEN AND PELVIS WITH AND WITHOUT CONTRAST  TECHNIQUE: Multidetector CT imaging of the chest was performed during intravenous contrast administration. Multidetector CT imaging of the  abdomen and pelvis was performed following the standard protocol before and during bolus administration of intravenous contrast.  CONTRAST:  176mL OMNIPAQUE IOHEXOL 300 MG/ML  SOLN  COMPARISON:  None.  CT CHEST W/CM dated 11/03/2013; DG CHEST 2 VIEW dated 10/29/2013; CT CHEST W/CM dated 05/09/2013; CT ABD - PELV W/ CM dated 07/28/2013  FINDINGS: CT CHEST FINDINGS  Left subclavian Port-A-Cath tip is in the upper SVC. Small mediastinal lymph nodes are stable, none pathologically enlarged. There is no pleural or pericardial effusion. There is stable mild heterogeneity of the left thyroid lobe.  Diffuse emphysema and scattered pulmonary parenchymal scarring are stable. There are no suspicious pulmonary nodules.  Endplate degenerative changes throughout the thoracic spine are stable. No lytic lesion or pathologic fracture identified.  CT ABDOMEN AND PELVIS FINDINGS  There is progressive multifocal hepatic metastatic disease. Previously referenced lesion in the anterior segment of the right hepatic lobe measures 2.8 x 2.3 cm on image 18 (previously 3.5 x 2.6 cm). Lesion in the posterior segment of the right hepatic lobe measures 3.2 x 4.0 cm on image 19 (previously 2.7 x 2.6 cm). Lesion in  the medial segment of the left hepatic lobe measures 5.6 x 4.9 cm on image 35 (previously 2.4 x 2.5 cm). The fluid density collection adjacent the gallbladder has decreased in size. Metallic biliary stent remains in place with persistent pneumobilia. There is mildly progressive dilatation of the left intrahepatic biliary system.  The previously demonstrated large mass arising from the pancreatic tail has enlarged and demonstrates progressive invasion of the adjacent left kidney. This now measures approximately 7.4 x 6.7 cm on image 39 (previously 3.7 x 6.2 cm). This mass invades the splenic hilum as before. The volume of ascites has mildly increased. There is increased omental and peritoneal nodularity consistent with carcinomatosis.  Dominant nodal metastasis adjacent to the left renal vessels has enlarged, measuring 3.0 x 3.4 cm on image 53.  There is progressive extrinsic mass effect on the portal vein in the porta hepatis. No vascular occlusion is identified. The right adrenal gland and right kidney demonstrate no significant findings. The left adrenal gland is not well differentiated from the large left upper quadrant mass.  There is no evidence of bowel or ureteral obstruction. Mild prominence of both ovaries is stable status post partial hysterectomy.  There are no worrisome osseous findings.  IMPRESSION:  1. Stable CT of the chest.  No evidence of thoracic metastases. 2. Progressive enlargement of dominant mass involving the pancreatic tail with adjacent invasion of the left kidney and splenic hilum. Progressive hepatic, retroperitoneal and omental metastases. 3. The volume of ascites has mildly increased. 4. The biliary stent is patent, although there is mildly increased intrahepatic biliary dilatation within the left lobe.   Electronically Signed   By: Camie Patience M.D.   On: 12/30/2013 11:54   Ct Chest W Contrast  12/30/2013   CLINICAL DATA:  Restaging metastatic pancreatic cancer.  EXAM: CT CHEST WITH CONTRAST  CT ABDOMEN AND PELVIS WITH AND WITHOUT CONTRAST  TECHNIQUE: Multidetector CT imaging of the chest was performed during intravenous contrast administration. Multidetector CT imaging of the abdomen and pelvis was performed following the standard protocol before and during bolus administration of intravenous contrast.  CONTRAST:  15mL OMNIPAQUE IOHEXOL 300 MG/ML  SOLN  COMPARISON:  None.  CT CHEST W/CM dated 11/03/2013; DG CHEST 2 VIEW dated 10/29/2013; CT CHEST W/CM dated 05/09/2013; CT ABD - PELV W/ CM dated 07/28/2013  FINDINGS: CT CHEST FINDINGS  Left subclavian Port-A-Cath tip is in the upper SVC. Small mediastinal lymph nodes are stable, none pathologically enlarged. There is no pleural or pericardial effusion. There is stable  mild heterogeneity of the left thyroid lobe.  Diffuse emphysema and scattered pulmonary parenchymal scarring are stable. There are no suspicious pulmonary nodules.  Endplate degenerative changes throughout the thoracic spine are stable. No lytic lesion or pathologic fracture identified.  CT ABDOMEN AND PELVIS FINDINGS  There is progressive multifocal hepatic metastatic disease. Previously referenced lesion in the anterior segment of the right hepatic lobe measures 2.8 x 2.3 cm on image 18 (previously 3.5 x 2.6 cm). Lesion in the posterior segment of the right hepatic lobe measures 3.2 x 4.0 cm on image 19 (previously 2.7 x 2.6 cm). Lesion in the medial segment of the left hepatic lobe measures 5.6 x 4.9 cm on image 35 (previously 2.4 x 2.5 cm). The fluid density collection adjacent the gallbladder has decreased in size. Metallic biliary stent remains in place with persistent pneumobilia. There is mildly progressive dilatation of the left intrahepatic biliary system.  The previously demonstrated large mass arising from the pancreatic  tail has enlarged and demonstrates progressive invasion of the adjacent left kidney. This now measures approximately 7.4 x 6.7 cm on image 39 (previously 3.7 x 6.2 cm). This mass invades the splenic hilum as before. The volume of ascites has mildly increased. There is increased omental and peritoneal nodularity consistent with carcinomatosis. Dominant nodal metastasis adjacent to the left renal vessels has enlarged, measuring 3.0 x 3.4 cm on image 53.  There is progressive extrinsic mass effect on the portal vein in the porta hepatis. No vascular occlusion is identified. The right adrenal gland and right kidney demonstrate no significant findings. The left adrenal gland is not well differentiated from the large left upper quadrant mass.  There is no evidence of bowel or ureteral obstruction. Mild prominence of both ovaries is stable status post partial hysterectomy.  There are no  worrisome osseous findings.  IMPRESSION:  1. Stable CT of the chest.  No evidence of thoracic metastases. 2. Progressive enlargement of dominant mass involving the pancreatic tail with adjacent invasion of the left kidney and splenic hilum. Progressive hepatic, retroperitoneal and omental metastases. 3. The volume of ascites has mildly increased. 4. The biliary stent is patent, although there is mildly increased intrahepatic biliary dilatation within the left lobe.   Electronically Signed   By: Camie Patience M.D.   On: 12/30/2013 11:54   US Paracentesis  12/30/2013   CLINICAL DATA:  Malignant pancreatic neoplasm, ascites  EXAM: ULTRASOUND GUIDED THERAPEUTIC PARACENTESIS  COMPARISON:  None.  PROCEDURE: Procedure, benefits, and risks of procedure were discussed with patient.  Written informed consent for procedure was obtained.  Time out protocol followed.  Adequate collection of ascites localized in left lower quadrant.  Skin prepped and draped in usual sterile fashion.  Skin and soft tissues anesthetized with 9 mL of 1% lidocaine.  5 [place] Yueh catheter placed into peritoneal cavity.  2500 mL of yellow fluid aspirated by vacuum bottle suction.  Procedure tolerated well by patient without immediate complication.  IMPRESSION: Successful ultrasound guided paracentesis yielding 2500 mL of ascites.   Electronically Signed   By: Lavonia Dana M.D.   On: 12/30/2013 17:35     ASSESSMENT:  1. Metastatic pancreatic cancer S/P 5 cycles of FOLFIRINOX chemotherapy with stable disease per CT scans, but recurrent ascites worrisome for progression of disease. She was treated with FOLFIRINOX + Neulasta support from 05/26/2013- 07/21/2013 with a change in therapy to Gemcitabine/Abraxane D1, 8, 15 every 28 days beginning on 08/18/2013. Due to thrombocytopenia for Day 15 of cycle 1, Day 15 of therapy is deleted from all subsequent cycles. Restaging CT scans following cycle 3 of Gemcitabine/Abraxane demonstrate stable to improved  disease.  After the [facility], she was noted to require weekly paracenteses and near the end of Jan 2015, she was needing paracenteses twice weekly.  Therefore, restaging CT scans were moved and completed on 12/30/2013.  CT scans demonstrate progression of disease. 2. Progression of disease 3. Malignant ascities  Patient Active Problem List   Diagnosis Date Noted  . Insomnia 11/07/2013  . Hepatic abscess 11/04/2013  . Fever 10/29/2013  . SIRS (systemic inflammatory response syndrome) 10/29/2013  . Peripheral neuropathy 10/13/2013  . 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 Malignant neoplasm of pancreas, part unspecified 05/21/2013  . Obstructive jaundice 05/07/2013  . Tobacco abuse 05/07/2013  . HTN (hypertension) 05/07/2013  . Diabetes mellitus 05/07/2013    PLAN:  1. I personally reviewed and went over laboratory results with the patient.  The results are  noted within this dictation. 2. I personally reviewed and went over radiographic studies with the patient.  The results are noted within this dictation.   3. Discussion regarding permanent catheter for ascites draining 4. Discussion regarding 3rd line therapy Xeloda versus transition to comfort care. 5. Rx for Xeloda 100 mg in AM and 1000 mg in PM 7 days on and 7 days off. 6. Rx refill for Hydrocodone 7. Chemotherapy teaching to be scheduled.  8. Labs at baseline in 2 weeks: CBC diff, CMET, CA 19-9 9. Labs monthly: CBC diff, CMET, CA 19-9 10. Return in 4 weeks for follow-up and address tolerability.   THERAPY PLAN:  Due to progression of disease demonstrated on CT scans, she was offered 3rd line therapy versus comfort care.  She has decided to pursue 3rd line therapy with Xeloda, which is reasonable given her clinical appearance.   All questions were answered. The patient knows to call the clinic with any problems, questions or concerns. We can certainly see the patient much sooner if  necessary.  Patient and plan discussed with Dr. Farrel Gobble and he is in agreement with the aforementioned.   Maygan Koeller

## 2013-12-30 NOTE — Progress Notes (Signed)
Paracentesis complete no signs of distress. 2500 ml amber colored abdominal fluid removed.

## 2013-12-30 NOTE — Procedures (Signed)
PreOperative Dx: Pancreatic cancer, ascites Postoperative Dx: Pancreatic cancer, ascites Procedure:   US guided paracentesis Radiologist:  Thornton Papas Anesthesia:  11ml of 1% lidocaine Specimen:  2500 ml of yellow ascitic fluid EBL:   None Complications: None

## 2013-12-31 ENCOUNTER — Encounter (HOSPITAL_COMMUNITY): Payer: Medicare Other | Attending: Oncology | Admitting: Oncology

## 2013-12-31 ENCOUNTER — Inpatient Hospital Stay (HOSPITAL_COMMUNITY): Payer: Medicare Other

## 2013-12-31 VITALS — BP 143/85 | HR 97 | Temp 97.9°F | Resp 20 | Wt 137.9 lb

## 2013-12-31 DIAGNOSIS — C252 Malignant neoplasm of tail of pancreas: Secondary | ICD-10-CM

## 2013-12-31 DIAGNOSIS — C259 Malignant neoplasm of pancreas, unspecified: Secondary | ICD-10-CM | POA: Insufficient documentation

## 2013-12-31 DIAGNOSIS — T451X5A Adverse effect of antineoplastic and immunosuppressive drugs, initial encounter: Secondary | ICD-10-CM

## 2013-12-31 DIAGNOSIS — E119 Type 2 diabetes mellitus without complications: Secondary | ICD-10-CM

## 2013-12-31 DIAGNOSIS — D6959 Other secondary thrombocytopenia: Secondary | ICD-10-CM

## 2013-12-31 DIAGNOSIS — I10 Essential (primary) hypertension: Secondary | ICD-10-CM

## 2013-12-31 MED ORDER — CAPECITABINE 500 MG PO TABS: ORAL_TABLET | ORAL | Status: DC

## 2013-12-31 MED ORDER — HYDROCODONE-ACETAMINOPHEN 10-325 MG PO TABS: 1.0000 | ORAL_TABLET | Freq: Four times a day (QID) | ORAL | Status: DC | PRN

## 2013-12-31 NOTE — Patient Instructions (Signed)
[facility] Discharge Instructions  RECOMMENDATIONS MADE BY THE CONSULTANT AND ANY TEST RESULTS WILL BE SENT TO YOUR REFERRING PHYSICIAN.  EXAM FINDINGS BY THE PHYSICIAN TODAY AND SIGNS OR SYMPTOMS TO REPORT TO CLINIC OR PRIMARY PHYSICIAN:   We are going to start you on Xeloda 1000mg  (2 tabs total)  in the am and 1000mg  (2 tabs total) in the pm. You will take this 7 days in a row and then take 7 days off in a row. You will take this medication 30 minutes after your morning meal and 30 minutes after your evening meal.   Hildred Alamin will do chemo teaching and make a chemo calendar.  You can not eat grapefruit or drink grapefruit juice while on Xeloda therapy. This includes the days that you are not taking Xeloda.   You will still need to come in to the [facility] for labs.  You will still need to come in to the [facility] to have your port flushed every 6 weeks.   If you want a permanent tube in your abdomen so that you can drain your "belly" fluid yourself @ home we can arrange for that. Just let us know.    CHEMO TEACHING WITH XELODA:  Xeloda - diarrhea, hand-foot syndrome (hands/feet can get red/tender/and skin can peel). Avoid friction and hot environments on hands/feet. Wear cotton socks. Lotion twice a day to hands/fingers/feet/toes with Udder cream. Mucositis (inflammation of any mucosal membrane can develop -this can occur in the throat/mouth). Mouth sores, nausea/vomiting, anemia, fatigue can also develop. Take Imodium if diarrhea develops and contact us immediately - we will give you further instructions on how to take your Imodium. Xeloda usually comes with a teaching packet from the mail order/specialty pharmacy that will supply you with the drug that is really informative about diarrhea and hand-foot syndrome. No pregnant, child bearing age people, or animals should come into contact with this drug. Even though this is in pill form - it is still very powerful!!!  If you should be instructed to discontinue taking this drug, bring the drug into the [facility] and we will dispose of it in a safe manner. Chemotherapy is a biohazard and must be disposed of properly. Do not touch this pill much. It would be best if the caregiver wore gloves while handling. Do not put this pill in with the rest of your pills in a pill box. Keep them in a separate pill box. If you are taking Coumadin while taking this drug, we will need to monitor your PT/INR (lab) closely because Xeloda can increase the effectiveness of Coumadin. You should take this medication within 30 minutes after eating your am and pm meals.      Thank you for choosing [facility] to provide your oncology and hematology care.  To afford each patient quality time with our providers, please arrive at least 15 minutes before your scheduled appointment time.  With your help, our goal is to use those 15 minutes to complete the necessary work-up to ensure our physicians have the information they need to help with your evaluation and healthcare recommendations.    Effective January 1st, 2014, we ask that you re-schedule your appointment with our physicians should you arrive 10 or more minutes late for your appointment.  We strive to give you quality time with our providers, and arriving late affects you and other patients whose appointments are after yours.    Again, thank you for choosing [facility].  Our hope is that these requests will decrease the amount of time that you wait before being seen by our physicians.       _____________________________________________________________  Should you have questions after your visit to [facility], please contact our office at [phone] between the hours of 8:30 a.m. and 5:00 p.m.  Voicemails left after 4:30 p.m. will not be returned until the following business day.  For prescription refill requests, have your pharmacy contact our  office with your prescription refill request.    Capecitabine tablets What is this medicine? CAPECITABINE (ka pe SITE a been) is a chemotherapy drug. It slows the growth of cancer cells. This medicine is used to treat breast cancer, and also colon or rectal cancer. This medicine may be used for other purposes; ask your health care provider or pharmacist if you have questions. COMMON BRAND NAME(S): Xeloda What should I tell my health care provider before I take this medicine? They need to know if you have any of these conditions: -bleeding or blood disorders -dihydropyrimidine dehydrogenase (DPD) deficiency -heart disease -infection (especially a virus infection such as chickenpox, cold sores, or herpes) -kidney disease -liver disease -an unusual or allergic reaction to capecitabine, 5-fluorouracil, other medicines, foods, dyes, or preservatives -pregnant or trying to get pregnant -breast-feeding How should I use this medicine? Take this medicine by mouth with a glass of water, within 30 minutes of the end of a meal. Follow the directions on the prescription label. Take your medicine at regular intervals. Do not take it more often than directed. Do not stop taking except on your doctor's advice. Your doctor may want you to take a combination of 150 mg and 500 mg tablets for each dose. It is very important that you know how to correctly take your dose. Taking the wrong tablets could result in an overdose (too much medication) or underdose (too little medication). Talk to your pediatrician regarding the use of this medicine in children. Special care may be needed. Overdosage: If you think you have taken too much of this medicine contact a poison control center or emergency room at once. NOTE: This medicine is only for you. Do not share this medicine with others. What if I miss a dose? If you miss a dose, do not take the missed dose at all. Do not take double or extra doses. Instead, continue  with your next scheduled dose and check with your doctor. What may interact with this medicine? -antacids with aluminum and/or magnesium -folic acid -leucovorin -medicines to increase blood counts like filgrastim, pegfilgrastim, sargramostim -phenytoin -vaccines -warfarin Talk to your doctor or health care professional before taking any of these medicines: -acetaminophen -aspirin -ibuprofen -ketoprofen -naproxen This list may not describe all possible interactions. Give your health care provider a list of all the medicines, herbs, non-prescription drugs, or dietary supplements you use. Also tell them if you smoke, drink alcohol, or use illegal drugs. Some items may interact with your medicine. What should I watch for while using this medicine? Visit your doctor for checks on your progress. This drug may make you feel generally unwell. This is not uncommon, as chemotherapy can affect healthy cells as well as cancer cells. Report any side effects. Continue your course of treatment even though you feel ill unless your doctor tells you to stop. In some cases, you may be given additional medicines to help with side effects. Follow all directions for their use. Call your doctor or health  care professional for advice if you get a fever, chills or sore throat, or other symptoms of a cold or flu. Do not treat yourself. This drug decreases your body's ability to fight infections. Try to avoid being around people who are sick. This medicine may increase your risk to bruise or bleed. Call your doctor or health care professional if you notice any unusual bleeding. Be careful brushing and flossing your teeth or using a toothpick because you may get an infection or bleed more easily. If you have any dental work done, tell your dentist you are receiving this medicine. Avoid taking products that contain aspirin, acetaminophen, ibuprofen, naproxen, or ketoprofen unless instructed by your doctor. These medicines  may hide a fever. Do not become pregnant while taking this medicine. Women should inform their doctor if they wish to become pregnant or think they might be pregnant. There is a potential for serious side effects to an unborn child. Talk to your health care professional or pharmacist for more information. Do not breast-feed an infant while taking this medicine. Men are advised not to father a child while taking this medicine. What side effects may I notice from receiving this medicine? Side effects that you should report to your doctor or health care professional as soon as possible: -allergic reactions like skin rash, itching or hives, swelling of the face, lips, or tongue -low blood counts - this medicine may decrease the number of white blood cells, red blood cells and platelets. You may be at increased risk for infections and bleeding. -signs of infection - fever or chills, cough, sore throat, pain or difficulty passing urine -signs of decreased platelets or bleeding - bruising, pinpoint red spots on the skin, black, tarry stools, blood in the urine -signs of decreased red blood cells - unusually weak or tired, fainting spells, lightheadedness -breathing problems -changes in vision -chest pain -diarrhea of more than 4 bowel movements in one day or any diarrhea at night -mouth sores -nausea and vomiting -pain, swelling, redness at site where injected -pain, tingling, numbness in the hands or feet -redness, swelling, or sores on hands or feet -stomach pain -vomiting -yellow color of skin or eyes Side effects that usually do not require medical attention (report to your doctor or health care professional if they continue or are bothersome): -constipation -diarrhea -dry or itchy skin -hair loss -loss of appetite -nausea -weak or tired This list may not describe all possible side effects. Call your doctor for medical advice about side effects. You may report side effects to FDA at  1-800-FDA-1088. Where should I keep my medicine? Keep out of the reach of children. Store at room temperature between 15 and 30 degrees C (59 and 86 degrees F). Keep container tightly closed. Throw away any unused medicine after the expiration date. NOTE: This sheet is a summary. It may not cover all possible information. If you have questions about this medicine, talk to your doctor, pharmacist, or health care provider.  2014, Elsevier/[employer] Standard. (2008-10-26 11:47:41)

## 2013-12-31 NOTE — Addendum Note (Signed)
Addended by: Baird Cancer on: 12/31/2013 01:37 PM   Modules accepted: Orders

## 2013-12-31 NOTE — Addendum Note (Signed)
Addended by: Baird Cancer on: 12/31/2013 12:17 PM   Modules accepted: Orders

## 2014-01-01 ENCOUNTER — Other Ambulatory Visit (HOSPITAL_COMMUNITY): Payer: Self-pay | Admitting: *Deleted

## 2014-01-01 ENCOUNTER — Encounter (HOSPITAL_BASED_OUTPATIENT_CLINIC_OR_DEPARTMENT_OTHER): Payer: Medicare Other

## 2014-01-01 DIAGNOSIS — C259 Malignant neoplasm of pancreas, unspecified: Secondary | ICD-10-CM

## 2014-01-01 DIAGNOSIS — C252 Malignant neoplasm of tail of pancreas: Secondary | ICD-10-CM

## 2014-01-01 NOTE — Progress Notes (Signed)
Chemo teaching regarding Xeloda done and consent signed. Xeloda pills given to patient from outpatient pharmacy. Patient to start Xeloda on 2/16. Med calendar given.

## 2014-01-01 NOTE — Patient Instructions (Addendum)
[facility]   CHEMOTHERAPY INSTRUCTIONS  Xeloda - diarrhea, hand-foot syndrome (hands/feet can get red/tender/and skin can peel). Avoid friction and hot environments on hands/feet. Wear cotton socks. Lotion twice a day to hands/fingers/feet/toes with Udder cream. Mucositis (inflammation of any mucosal membrane can develop -this can occur in the throat/mouth). Mouth sores, nausea/vomiting, anemia, fatigue can also develop. Take Imodium if diarrhea develops and contact us immediately - we will give you further instructions on how to take your Imodium. Xeloda usually comes with a teaching packet from the mail order/specialty pharmacy that will supply you with the drug that is really informative about diarrhea and hand-foot syndrome. No pregnant, child bearing age people, or animals should come into contact with this drug. Even though this is in pill form - it is still very powerful!!!   If you should be instructed to discontinue taking this drug, bring the drug into the [facility] and we will dispose of it in a safe manner. Chemotherapy is a biohazard and must be disposed of properly. Do not touch this pill much. It would be best if the caregiver wore gloves while handling. Do not put this pill in with the rest of your pills in a pill box. Keep them in a separate pill box.   You should take this medication within 30 minutes after eating your am and pm meals. Do not crush, chew, or break these tablets. You must swallow them whole with a glass of water. Do not eat grapefruit or drink grapefruit juice for the entire length of time you are being treated with this medication.   Xeloda 500mg  tablet. For 7 days in a row, take 2 tablets 30 minutes after your am meal and 2 tablets 30 minutes after your pm meal. Then you will have a 7 day rest period where you do not take any Xeloda. This will be a repeating cycle for as long as the doctor tells you to take it.    EDUCATIONAL  MATERIALS GIVEN AND REVIEWED: Specific Instructions Sheets: Xeloda   SELF CARE ACTIVITIES WHILE ON CHEMOTHERAPY: Increase your fluid intake 48 hours prior to treatment and drink at least 2 quarts per day after treatment., No alcohol intake., No aspirin or other medications unless approved by your oncologist., Eat foods that are light and easy to digest., Eat foods at cold or room temperature., No fried, fatty, or spicy foods immediately before or after treatment., Have teeth cleaned professionally before starting treatment. Keep dentures and partial plates clean., Use soft toothbrush and do not use mouthwashes that contain alcohol. Biotene is a good mouthwash that is available at most pharmacies or may be ordered by calling [phone]., Use warm salt water gargles (1 teaspoon salt per 1 quart warm water) before and after meals and at bedtime. Or you may rinse with 2 tablespoons of three -percent hydrogen peroxide mixed in eight ounces of water., Always use sunscreen with SPF (Sun Protection Factor) of 30 or higher., Use your nausea medication as directed to prevent nausea. and Use your stool softener or laxative as directed to prevent constipation.  Please wash your hands for at least 30 seconds using warm soapy water. Handwashing is the #1 way to prevent the spread of germs. Stay away from sick people or people who are getting over a cold. If you develop respiratory systems such as green/yellow mucus production or productive cough or persistent cough let us know and we will see if you need an antibiotic.  It is a good idea to keep a pair of gloves on when going into grocery stores/[employer] to decrease your risk of coming into contact with germs on the carts, etc. Carry alcohol hand gel with you at all times and use it frequently if out in public. All foods need to be cooked thoroughly. No raw foods. No medium or undercooked meats, eggs. If your food is cooked medium well, it does not need to be hot pink or  saturated with bloody liquid at all. Vegetables and fruits need to be washed/rinsed under the faucet with a dish detergent before being consumed. You can eat raw fruits and vegetables unless we tell you otherwise but it would be best if you cooked them or bought frozen. Do not eat off of salad bars or hot bars unless you really trust the cleanliness of the restaurant. If you need dental work, please let us know before you go for your appointment so that we can coordinate the best possible time for you in regards to your chemo regimen. You need to also let your dentist know that you are actively taking chemo. We may need to do labs prior to your dental appointment. We also want your bowels moving at least every other day. If this is not happening, we need to know so that we can get you on a bowel regimen to help you go.     MEDICATIONS: You have been given prescriptions for the following medications:  Over-the-Counter Meds:  Colace - this is a stool softener. Take 100mg  capsule 2-6 times a day as needed. If you have to take more than 6 capsules of Colace a day call the Cancer Center.  Senna - this is a mild laxative used to treat mild constipation. May take 2 tabs by mouth daily or up to twice a day as needed for mild constipation.  Milk of Magnesia - this is a laxative used to treat moderate to severe constipation. May take 2-4 tablespoons every 8 hours as needed. May increase to 8 tablespoons x 1 dose and if no bowel movement call the Cancer Center.  Imodium - this is for diarrhea. Take 2 tabs after 1st loose stool and then 1 tab after each loose stool until you go a total of 12 hours without a loose stool. Call Cancer Center if loose stools continue.   SYMPTOMS TO REPORT AS SOON AS POSSIBLE AFTER TREATMENT:  FEVER GREATER THAN 100.5 F  CHILLS WITH OR WITHOUT FEVER  NAUSEA AND VOMITING THAT IS NOT CONTROLLED WITH YOUR NAUSEA MEDICATION  UNUSUAL SHORTNESS OF BREATH  UNUSUAL BRUISING OR  BLEEDING  TENDERNESS IN MOUTH AND THROAT WITH OR WITHOUT PRESENCE OF ULCERS  URINARY PROBLEMS  BOWEL PROBLEMS  UNUSUAL RASH    Wear comfortable clothing and clothing appropriate for easy access to any Portacath or PICC line. Let us know if there is anything that we can do to make your therapy better!      I have been informed and understand all of the instructions given to me and have received a copy. I have been instructed to call the clinic [phone] or my family physician as soon as possible for continued medical care, if indicated. I do not have any more questions at this time but understand that I may call the Cancer Center or the Patient Navigator at [phone] during office hours should I have questions or need assistance in obtaining follow-up care.      _________________________________________  _______________     __________ Signature of Patient or Authorized Representative        Date                            Time      _________________________________________ Nurse's Signature      Capecitabine tablets What is this medicine? CAPECITABINE (ka pe SITE a been) is a chemotherapy drug. It slows the growth of cancer cells. This medicine is used to treat breast cancer, and also colon or rectal cancer. This medicine may be used for other purposes; ask your health care provider or pharmacist if you have questions. COMMON BRAND NAME(S): Xeloda What should I tell my health care provider before I take this medicine? They need to know if you have any of these conditions: -bleeding or blood disorders -dihydropyrimidine dehydrogenase (DPD) deficiency -heart disease -infection (especially a virus infection such as chickenpox, cold sores, or herpes) -kidney disease -liver disease -an unusual or allergic reaction to capecitabine, 5-fluorouracil, other medicines, foods, dyes, or preservatives -pregnant or trying to get pregnant -breast-feeding How should I  use this medicine? Take this medicine by mouth with a glass of water, within 30 minutes of the end of a meal. Follow the directions on the prescription label. Take your medicine at regular intervals. Do not take it more often than directed. Do not stop taking except on your doctor's advice. Your doctor may want you to take a combination of 150 mg and 500 mg tablets for each dose. It is very important that you know how to correctly take your dose. Taking the wrong tablets could result in an overdose (too much medication) or underdose (too little medication). Talk to your pediatrician regarding the use of this medicine in children. Special care may be needed. Overdosage: If you think you have taken too much of this medicine contact a poison control center or emergency room at once. NOTE: This medicine is only for you. Do not share this medicine with others. What if I miss a dose? If you miss a dose, do not take the missed dose at all. Do not take double or extra doses. Instead, continue with your next scheduled dose and check with your doctor. What may interact with this medicine? -antacids with aluminum and/or magnesium -folic acid -leucovorin -medicines to increase blood counts like filgrastim, pegfilgrastim, sargramostim -phenytoin -vaccines -warfarin Talk to your doctor or health care professional before taking any of these medicines: -acetaminophen -aspirin -ibuprofen -ketoprofen -naproxen This list may not describe all possible interactions. Give your health care provider a list of all the medicines, herbs, non-prescription drugs, or dietary supplements you use. Also tell them if you smoke, drink alcohol, or use illegal drugs. Some items may interact with your medicine. What should I watch for while using this medicine? Visit your doctor for checks on your progress. This drug may make you feel generally unwell. This is not uncommon, as chemotherapy can affect healthy cells as well as  cancer cells. Report any side effects. Continue your course of treatment even though you feel ill unless your doctor tells you to stop. In some cases, you may be given additional medicines to help with side effects. Follow all directions for their use. Call your doctor or health care professional for advice if you get a fever, chills or sore throat, or other symptoms of a cold or flu. Do not treat yourself. This drug decreases your body's ability to fight infections.  Try to avoid being around people who are sick. This medicine may increase your risk to bruise or bleed. Call your doctor or health care professional if you notice any unusual bleeding. Be careful brushing and flossing your teeth or using a toothpick because you may get an infection or bleed more easily. If you have any dental work done, tell your dentist you are receiving this medicine. Avoid taking products that contain aspirin, acetaminophen, ibuprofen, naproxen, or ketoprofen unless instructed by your doctor. These medicines may hide a fever. Do not become pregnant while taking this medicine. Women should inform their doctor if they wish to become pregnant or think they might be pregnant. There is a potential for serious side effects to an unborn child. Talk to your health care professional or pharmacist for more information. Do not breast-feed an infant while taking this medicine. Men are advised not to father a child while taking this medicine. What side effects may I notice from receiving this medicine? Side effects that you should report to your doctor or health care professional as soon as possible: -allergic reactions like skin rash, itching or hives, swelling of the face, lips, or tongue -low blood counts - this medicine may decrease the number of white blood cells, red blood cells and platelets. You may be at increased risk for infections and bleeding. -signs of infection - fever or chills, cough, sore throat, pain or difficulty  passing urine -signs of decreased platelets or bleeding - bruising, pinpoint red spots on the skin, black, tarry stools, blood in the urine -signs of decreased red blood cells - unusually weak or tired, fainting spells, lightheadedness -breathing problems -changes in vision -chest pain -diarrhea of more than 4 bowel movements in one day or any diarrhea at night -mouth sores -nausea and vomiting -pain, swelling, redness at site where injected -pain, tingling, numbness in the hands or feet -redness, swelling, or sores on hands or feet -stomach pain -vomiting -yellow color of skin or eyes Side effects that usually do not require medical attention (report to your doctor or health care professional if they continue or are bothersome): -constipation -diarrhea -dry or itchy skin -hair loss -loss of appetite -nausea -weak or tired This list may not describe all possible side effects. Call your doctor for medical advice about side effects. You may report side effects to FDA at 1-800-FDA-1088. Where should I keep my medicine? Keep out of the reach of children. Store at room temperature between 15 and 30 degrees C (59 and 86 degrees F). Keep container tightly closed. Throw away any unused medicine after the expiration date. NOTE: This sheet is a summary. It may not cover all possible information. If you have questions about this medicine, talk to your doctor, pharmacist, or health care provider.  2014, [employer]vier/[employer] Standard. (2008-10-26 11:47:41)

## 2014-01-05 ENCOUNTER — Ambulatory Visit (HOSPITAL_COMMUNITY): Payer: Medicare Other

## 2014-01-06 ENCOUNTER — Ambulatory Visit (HOSPITAL_COMMUNITY)
Admission: RE | Admit: 2014-01-06 | Discharge: 2014-01-06 | Disposition: A | Discharge status: Home / Self Care | Payer: Medicare Other | Source: Ambulatory Visit | Attending: Hematology and Oncology | Admitting: Hematology and Oncology

## 2014-01-06 ENCOUNTER — Other Ambulatory Visit (HOSPITAL_COMMUNITY): Payer: Self-pay | Admitting: *Deleted

## 2014-01-06 ENCOUNTER — Encounter (HOSPITAL_COMMUNITY): Payer: Self-pay

## 2014-01-06 DIAGNOSIS — C259 Malignant neoplasm of pancreas, unspecified: Secondary | ICD-10-CM

## 2014-01-06 DIAGNOSIS — R188 Other ascites: Secondary | ICD-10-CM | POA: Insufficient documentation

## 2014-01-06 NOTE — Procedures (Signed)
PreOperative Dx: Pancreatic cancer, ascites Postoperative Dx: Pancreatic cancer, ascites Procedure:   US guided paracentesis Radiologist:  Thornton Papas Anesthesia:  10 ml of 1% lidocaine Specimen:  2200 ml of yellow ascitic fluid EBL:   None Complications: None

## 2014-01-06 NOTE — Progress Notes (Signed)
Paracentesis complete no signs of distress. 2200 ml yellow abdominal fluid removed.

## 2014-01-08 ENCOUNTER — Other Ambulatory Visit (HOSPITAL_COMMUNITY): Payer: Medicare Other

## 2014-01-08 ENCOUNTER — Ambulatory Visit (HOSPITAL_COMMUNITY): Payer: Medicare Other

## 2014-01-09 ENCOUNTER — Encounter (HOSPITAL_BASED_OUTPATIENT_CLINIC_OR_DEPARTMENT_OTHER): Payer: Medicare Other

## 2014-01-09 DIAGNOSIS — C252 Malignant neoplasm of tail of pancreas: Secondary | ICD-10-CM

## 2014-01-09 DIAGNOSIS — C259 Malignant neoplasm of pancreas, unspecified: Secondary | ICD-10-CM

## 2014-01-09 LAB — CBC WITH DIFFERENTIAL/PLATELET
BASOS ABS: 0 10*3/uL (ref 0.0–0.1)
Basophils Relative: 0 % (ref 0–1)
Eosinophils Absolute: 0.6 10*3/uL (ref 0.0–0.7)
Eosinophils Relative: 3 % (ref 0–5)
HCT: 32.6 % — ABNORMAL LOW (ref 36.0–46.0)
Hemoglobin: 10 g/dL — ABNORMAL LOW (ref 12.0–15.0)
LYMPHS ABS: 2.1 10*3/uL (ref 0.7–4.0)
Lymphocytes Relative: 10 % — ABNORMAL LOW (ref 12–46)
MCH: 26.7 pg (ref 26.0–34.0)
MCHC: 30.7 g/dL (ref 30.0–36.0)
MCV: 86.9 fL (ref 78.0–100.0)
MONO ABS: 1.7 10*3/uL — AB (ref 0.1–1.0)
Monocytes Relative: 8 % (ref 3–12)
NEUTROS PCT: 79 % — AB (ref 43–77)
Neutro Abs: 16.9 10*3/uL — ABNORMAL HIGH (ref 1.7–7.7)
Platelets: 335 10*3/uL (ref 150–400)
RBC: 3.75 MIL/uL — ABNORMAL LOW (ref 3.87–5.11)
RDW: 20.2 % — AB (ref 11.5–15.5)
WBC: 21.3 10*3/uL — AB (ref 4.0–10.5)

## 2014-01-09 LAB — COMPREHENSIVE METABOLIC PANEL
ALBUMIN: 2 g/dL — AB (ref 3.5–5.2)
ALK PHOS: 241 U/L — AB (ref 39–117)
ALT: 12 U/L (ref 0–35)
AST: 22 U/L (ref 0–37)
BILIRUBIN TOTAL: 0.5 mg/dL (ref 0.3–1.2)
BUN: 19 mg/dL (ref 6–23)
CHLORIDE: 98 meq/L (ref 96–112)
CO2: 27 meq/L (ref 19–32)
CREATININE: 1.57 mg/dL — AB (ref 0.50–1.10)
Calcium: 8.4 mg/dL (ref 8.4–10.5)
GFR calc Af Amer: 39 mL/min — ABNORMAL LOW (ref >90)
GFR, EST NON AFRICAN AMERICAN: 34 mL/min — AB (ref >90)
GLUCOSE: 135 mg/dL — AB (ref 70–99)
Potassium: 3.7 mEq/L (ref 3.7–5.3)
Sodium: 139 mEq/L (ref 137–147)
Total Protein: 8.2 g/dL (ref 6.0–8.3)

## 2014-01-09 MED ORDER — HEPARIN SOD (PORK) LOCK FLUSH 100 UNIT/ML IV SOLN: 500.0000 [IU] | Freq: Once | INTRAVENOUS | Status: DC

## 2014-01-09 MED ORDER — SODIUM CHLORIDE 0.9 % IJ SOLN
10.0000 mL | INTRAMUSCULAR | Status: DC | PRN
Start: 2014-01-09 — End: 2014-01-09

## 2014-01-09 MED ORDER — HEPARIN SOD (PORK) LOCK FLUSH 100 UNIT/ML IV SOLN
INTRAVENOUS | Status: AC
  Filled 2014-01-09: qty 5

## 2014-01-09 NOTE — Progress Notes (Signed)
[name] presented for Portacath access and flush. Portacath located lt chest wall accessed with  H 20 needle. Good blood return present. Portacath flushed with 54ml NS and 500U/21ml Heparin and needle removed intact. Procedure without incident. Patient tolerated procedure well.

## 2014-01-10 LAB — CANCER ANTIGEN 19-9

## 2014-01-12 ENCOUNTER — Ambulatory Visit (HOSPITAL_COMMUNITY)
Admission: RE | Admit: 2014-01-12 | Discharge: 2014-01-12 | Disposition: A | Discharge status: Home / Self Care | Payer: Medicare Other | Source: Ambulatory Visit | Attending: Oncology | Admitting: Oncology

## 2014-01-12 ENCOUNTER — Encounter (HOSPITAL_COMMUNITY): Payer: Self-pay

## 2014-01-12 ENCOUNTER — Ambulatory Visit (HOSPITAL_COMMUNITY): Payer: Medicare Other | Admitting: Oncology

## 2014-01-12 ENCOUNTER — Other Ambulatory Visit (HOSPITAL_COMMUNITY): Payer: Self-pay | Admitting: Oncology

## 2014-01-12 ENCOUNTER — Other Ambulatory Visit (HOSPITAL_COMMUNITY): Payer: Medicare Other

## 2014-01-12 DIAGNOSIS — C259 Malignant neoplasm of pancreas, unspecified: Secondary | ICD-10-CM | POA: Insufficient documentation

## 2014-01-12 DIAGNOSIS — R509 Fever, unspecified: Secondary | ICD-10-CM

## 2014-01-12 DIAGNOSIS — R188 Other ascites: Secondary | ICD-10-CM

## 2014-01-12 DIAGNOSIS — R1011 Right upper quadrant pain: Secondary | ICD-10-CM

## 2014-01-12 DIAGNOSIS — R180 Malignant ascites: Secondary | ICD-10-CM

## 2014-01-12 DIAGNOSIS — C787 Secondary malignant neoplasm of liver and intrahepatic bile duct: Secondary | ICD-10-CM

## 2014-01-12 LAB — BODY FLUID CELL COUNT WITH DIFFERENTIAL
EOS FL: 5 %
Lymphs, Fluid: 17 %
MONOCYTE-MACROPHAGE-SEROUS FLUID: 76 % (ref 50–90)
Neutrophil Count, Fluid: 2 % (ref 0–25)
Total Nucleated Cell Count, Fluid: 354 cu mm (ref 0–1000)

## 2014-01-12 NOTE — Procedures (Signed)
PreOperative Dx: Pancreatic cancer metastatic to liver, ascites Postoperative Dx: Pancreatic cancer metastatic to live, ascites Procedure:   US guided paracentesis Radiologist:  Thornton Papas Anesthesia:  9 ml of 1% lidocaine Specimen:  2200 ml of yellow ascitic fluid EBL:   None Complications: None

## 2014-01-12 NOTE — Progress Notes (Signed)
Paracentesis complete no signs of distress. 2200 ml brown colored abdominal fluid removed.

## 2014-01-13 LAB — PATHOLOGIST SMEAR REVIEW

## 2014-01-14 ENCOUNTER — Other Ambulatory Visit (HOSPITAL_COMMUNITY): Payer: Medicare Other

## 2014-01-15 ENCOUNTER — Inpatient Hospital Stay (HOSPITAL_COMMUNITY): Payer: Medicare Other

## 2014-01-15 ENCOUNTER — Ambulatory Visit (HOSPITAL_COMMUNITY): Payer: Medicare Other | Admitting: Oncology

## 2014-01-15 ENCOUNTER — Other Ambulatory Visit (HOSPITAL_COMMUNITY): Payer: Medicare Other

## 2014-01-16 LAB — BODY FLUID CULTURE
CULTURE: NO GROWTH
GRAM STAIN: NONE SEEN

## 2014-01-17 LAB — ANAEROBIC CULTURE: Gram Stain: NONE SEEN

## 2014-01-18 NOTE — Progress Notes (Signed)
Robert Bellow, MD [place] [address]  Malignant neoplasm of pancreas, part unspecified - Plan: US Abdomen Limited, US Paracentesis, HYDROcodone-acetaminophen (NORCO) 10-325 MG per tablet, morphine (MSIR) 15 MG tablet  Abdominal pain, unspecified site  Decreased appetite  Weight loss  CURRENT THERAPY: Salvage Xeloda 1000 mg in AM and PM 7 days on and 7 days off, beginning on 01/12/2014.  INTERVAL HISTORY: SRUTI Bonilla 68 y.o. female returns for  regular  visit for followup of Metastatic pancreatic cancer S/P 5 cycles of FOLFIRINOX chemotherapy with stable disease per CT scans, but recurrent ascites worrisome for progression of disease. She was treated with FOLFIRINOX + Neulasta support from 05/26/2013- 07/21/2013 with a change in therapy to Gemcitabine/Abraxane D1, 8, 15 every 28 days beginning on 08/18/2013. Due to thrombocytopenia for Day 15 of cycle 1, Day 15 of therapy was deleted from all subsequent cycles. Restaging CT scans following cycle 3 of Gemcitabine/Abraxane demonstrate stable to improved disease. After the [facility], she was noted to require weekly paracenteses and near the end of Jan 2015, she was needing paracenteses twice weekly. Therefore, restaging CT scans were moved and completed on 12/30/2013. CT scans demonstrated progression of disease.  Therefore, therapy was changed to Salvage Xeloda alone 1000 mg in AM and PM beginning on 01/12/2014.     Malignant neoplasm of pancreas, part unspecified   05/12/2013 Initial Diagnosis Malignant neoplasm of pancreas, adenocarcinoma.  Liver biopsy   05/26/2013 - 07/21/2013 Chemotherapy FOLFIRINOX chemotherapy.  5 cycles with stable disease per CT scans, but recurrent malignant ascites worrisome for progression.   07/22/2013 Progression Recurrent ascites   08/18/2013 - 12/08/2013 Chemotherapy Abraxane/Gemcitabine D1 + D8 every 28 days.  Day 15 cancelled and deleted due to thrombocytopenia for Day 15 of cycle  1.  Completed 4 cycles   11/04/2013 - 11/13/2013 Hospital Admission Percutaneous drainage of intrahepatic abscess by IR.   11/24/2013 Progression Weekly paracenteses for malignant ascites becoming more frequent and nearly twice per week at the end of Jan 2015.   12/30/2013 Imaging CT CAP for restaging demonstrates progrssion of disease   01/12/2014 -  Chemotherapy Xeloda 100 mg in AM and 1000 mg in PM 7 days on and 7 days off.  Salvage therapy    I personally reviewed and went over laboratory results with the patient.  The results are noted within this dictation.  Mackayla has a few complaints that worry me about symptomatic progression of disease: 1. Decreased appetite with associated weight loss. She weighs in today at 125 pounds which she reports she has never weighed since high school. Her decreased appetite is likely multifactorial from malignancy and malignant ascites. She underwent an ultrasound-guided therapeutic paracentesis and 2200 mL of yellow abdominal fluid was removed. I provided her education regarding this. She will call us later this week with an update on her appetite. If her appetite continues to be diminished despite therapeutic paracentesis today, I would be inclined to give her Marinol for appetite stimulation. She denies ever smoking marijuana in the past or presently. It answered all questions regarding Marinol therapy. We discussed the risks, benefits, alternatives, and side effects of this therapy. 2. Abdominal pain, left flank pain. She denies any urinary complaints including frequency, pain, and burning. I suspect this is secondary to progression of disease plus or minus malignant ascites. She reports that she's been taking 2 hydrocodone tablets for the discomfort. She admits he takes the edge off but has not alleviate her discomfort.  As a result, I have changed her pain medication regimen.  I've given her prescription for morphine sulfate 15-30 mg every 4 hours as needed for pain.  Additionally, I given her prescription for hydrocodone in the event that morphine sulfate is not tolerated. She has an allergy to oxycodone and is listed in [facility] as pruritus. I given her education regarding the risks, benefits, alternatives, and side effects of morphine including respiratory depression, dryness, allergic reaction, constipation, and death.  She is presently on her week off from Xeloda she will restart as scheduled.  Again her therapy is salvage therapy because she feels and looks good from an oncology standpoint. She is reeducated on the fact that this therapy would not likely provide benefit to her and she is agreeable and understanding of this information. She wishes to continue as planned. Depending on her next few weeks I think we will need to consider continued therapy versus transitioning to comfort care. At this point time, comfort care is not of interest to the patient. We discussed this in the past.  Otherwise, she denies any complaints and ROS questioning is negative.  Past Medical History  Diagnosis Date  . Diabetes mellitus without complication   . Hypertension   . Anxiety   . Arthritis   . Pancreatic cancer   . 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 Peripheral neuropathy 10/13/2013  . Liver abscess 11/04/2013    has Obstructive jaundice; Tobacco abuse; HTN (hypertension); Diabetes mellitus; Malignant neoplasm of pancreas, part unspecified; Antineoplastic chemotherapy induced anemia(285.3); Peripheral neuropathy; SIRS (systemic inflammatory response syndrome); Hepatic abscess; and Insomnia on her problem list.     is allergic to oxycodone hcl and levaquin.  Ms. Kreeger had no medications administered during this visit.  Past Surgical History  Procedure Laterality Date  . Abdominal hysterectomy    . Colonoscopy  2006    Dr. Gala Romney, polyp, benign  . Ercp  05/10/2013    Procedure: ENDOSCOPIC RETROGRADE CHOLANGIOPANCREATOGRAPHY  (ERCP)(DIFFICULT CANNULATION) ;  Surgeon: Rogene Houston, MD;  Location: [facility];  Service: Endoscopy;;  . Biliary stent placement  05/10/2013    Procedure: BILIARY STENT PLACEMENT (10MM X 80MM WALLFLEX STENT);  Surgeon: Rogene Houston, MD;  Location: [facility];  Service: Endoscopy;;  . Sphincterotomy  05/10/2013    Procedure: SPHINCTEROTOMY (PRECUT MADE WITH NEEDLE KNIFE);  Surgeon: Rogene Houston, MD;  Location: [facility];  Service: Endoscopy;;  . Liver biopsy  05/12/2013  . Portacath placement Left 05/23/2013    Procedure: INSERTION PORT-A-CATH;  Surgeon: Donato Heinz, MD;  Location: [facility];  Service: General;  Laterality: Left;    Denies any headaches, dizziness, double vision, fevers, chills, night sweats, nausea, vomiting, diarrhea, constipation, chest pain, heart palpitations, shortness of breath, blood in stool, black tarry stool, urinary pain, urinary burning, urinary frequency, hematuria.   PHYSICAL EXAMINATION  ECOG PERFORMANCE STATUS: 2 - Symptomatic, <50% confined to bed  Filed Vitals:   01/19/14 1136  BP: 104/75  Pulse: 124  Temp: 97.7 F (36.5 C)  Resp: 20    GENERAL:alert, cachectic, chronically-ill looking and smiling, in a wheelchair. SKIN: skin color, texture, turgor are normal, dry skin on hands HEAD: Normocephalic, No masses, lesions, tenderness or abnormalities EYES: normal, PERRLA, EOMI, Conjunctiva are pink and non-injected EARS: External ears normal OROPHARYNX:mucous membranes are moist  NECK: supple, trachea midline LYMPH:  not examined BREAST:not examined LUNGS: not examined HEART: not examined ABDOMEN: soft, nondistended BACK: Back symmetric, no curvature., No CVA tenderness EXTREMITIES:less then  2 second capillary refill, no joint deformities, effusion, or inflammation, no edema, no skin discoloration, no cyanosis  NEURO: alert & oriented x 3 with fluent speech, no focal motor/sensory deficits   LABORATORY DATA: CBC    Component Value Date/Time    WBC 21.3* 01/09/2014 1250   RBC 3.75* 01/09/2014 1250   RBC 2.71* 07/28/2013 0557   HGB 10.0* 01/09/2014 1250   HCT 32.6* 01/09/2014 1250   PLT 335 01/09/2014 1250   MCV 86.9 01/09/2014 1250   MCH 26.7 01/09/2014 1250   MCHC 30.7 01/09/2014 1250   RDW 20.2* 01/09/2014 1250   LYMPHSABS 2.1 01/09/2014 1250   MONOABS 1.7* 01/09/2014 1250   EOSABS 0.6 01/09/2014 1250   BASOSABS 0.0 01/09/2014 1250      Chemistry      Component Value Date/Time   NA 139 01/09/2014 1250   K 3.7 01/09/2014 1250   CL 98 01/09/2014 1250   CO2 27 01/09/2014 1250   BUN 19 01/09/2014 1250   CREATININE 1.57* 01/09/2014 1250      Component Value Date/Time   CALCIUM 8.4 01/09/2014 1250   ALKPHOS 241* 01/09/2014 1250   AST 22 01/09/2014 1250   ALT 12 01/09/2014 1250   BILITOT 0.5 01/09/2014 1250    Results for LARSEN, ARAMBURU (MRN [id number]) as of 01/18/2014 19:45  Ref. Range 01/09/2014 12:50  CA 19-9 Latest Range: <35.0 U/mL >140000.0 (H)     ASSESSMENT:  1. Metastatic pancreatic cancer S/P 5 cycles of FOLFIRINOX chemotherapy with stable disease per CT scans, but recurrent ascites worrisome for progression of disease. She was treated with FOLFIRINOX + Neulasta support from 05/26/2013- 07/21/2013 with a change in therapy to Gemcitabine/Abraxane D1, 8, 15 every 28 days beginning on 08/18/2013. Due to thrombocytopenia for Day 15 of cycle 1, Day 15 of therapy was deleted from all subsequent cycles. Restaging CT scans following cycle 3 of Gemcitabine/Abraxane demonstrate stable to improved disease. After the [facility], she was noted to require weekly paracenteses and near the end of Jan 2015, she was needing paracenteses twice weekly. Therefore, restaging CT scans were moved and completed on 12/30/2013. CT scans demonstrated progression of disease.  Therefore, therapy was changed to Salvage Xeloda alone 1000 mg in AM and PM beginning on 01/12/2014. 2. Recurrent malignant ascites 3. Abdominal pain 4. Decreased appetite 5. Weight loss 6.  Cachexia  Patient Active Problem List   Diagnosis Date Noted  . Insomnia 11/07/2013  . Hepatic abscess 11/04/2013  . SIRS (systemic inflammatory response syndrome) 10/29/2013  . Peripheral neuropathy 10/13/2013  . 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 Malignant neoplasm of pancreas, part unspecified 05/21/2013  . Obstructive jaundice 05/07/2013  . Tobacco abuse 05/07/2013  . HTN (hypertension) 05/07/2013  . Diabetes mellitus 05/07/2013     PLAN:  1. I personally reviewed and went over radiographic studies with the patient.  The results are noted within this dictation.   2. Labs in 2 weeks: CBC diff, CMET, CA 19-9 3. Continue Xeloda 1000 mg BID 7 days on and 7 days off 4. Discussion regarding role and effectiveness of 3rd line salvage therapy (Xeloda). 5. Rx for Morphine Sulfate 15-30 mg PO every 4 hours PRN pain.   6. Discussed the risks, benefits, alternatives and side effects of morphine sulfate 7. Rx for Hydrocodone.  Given her "allergy" to Oxycodone, she will use Hydrocodone if she has any intolerances to morphine. 8. Standing orders placed: [place] abd limited and US-guided paracentesis 9. Discussion regarding decreased appetite  and weight loss.  I would be inclined to start Marinol depending on her appetite response to US paracentesis today. 10. US abdomen limited and US-guided paracentesis today. 11. Return in 2 weeks for follow-up.   THERAPY PLAN:  She will continue with Xeloda 1000 mg in AM and PM 7 days on and 7 days off.  We will manage toxicities as they come up and provide her with paracenteses from a therapeutic standpoint.  Therapy is being given in a salvage approach.  She is a candidate for Hospice at anytime.   All questions were answered. The patient knows to call the clinic with any problems, questions or concerns. We can certainly see the patient much sooner if necessary.  Patient and plan discussed with Dr. Farrel Gobble and he is in  agreement with the aforementioned.   [name]

## 2014-01-19 ENCOUNTER — Encounter (HOSPITAL_COMMUNITY): Payer: Self-pay | Admitting: Oncology

## 2014-01-19 ENCOUNTER — Ambulatory Visit (HOSPITAL_COMMUNITY)
Admission: RE | Admit: 2014-01-19 | Discharge: 2014-01-19 | Disposition: A | Discharge status: Home / Self Care | Payer: Medicare Other | Source: Ambulatory Visit | Attending: Oncology | Admitting: Oncology

## 2014-01-19 ENCOUNTER — Encounter (HOSPITAL_BASED_OUTPATIENT_CLINIC_OR_DEPARTMENT_OTHER): Payer: Medicare Other | Admitting: Oncology

## 2014-01-19 ENCOUNTER — Encounter (HOSPITAL_COMMUNITY): Payer: Self-pay

## 2014-01-19 ENCOUNTER — Other Ambulatory Visit (HOSPITAL_COMMUNITY): Payer: Medicare Other

## 2014-01-19 VITALS — BP 104/75 | HR 124 | Temp 97.7°F | Resp 20 | Wt 125.2 lb

## 2014-01-19 DIAGNOSIS — R180 Malignant ascites: Secondary | ICD-10-CM

## 2014-01-19 DIAGNOSIS — R188 Other ascites: Secondary | ICD-10-CM | POA: Insufficient documentation

## 2014-01-19 DIAGNOSIS — C259 Malignant neoplasm of pancreas, unspecified: Secondary | ICD-10-CM

## 2014-01-19 DIAGNOSIS — R64 Cachexia: Secondary | ICD-10-CM

## 2014-01-19 DIAGNOSIS — C787 Secondary malignant neoplasm of liver and intrahepatic bile duct: Secondary | ICD-10-CM

## 2014-01-19 DIAGNOSIS — R630 Anorexia: Secondary | ICD-10-CM

## 2014-01-19 DIAGNOSIS — R634 Abnormal weight loss: Secondary | ICD-10-CM

## 2014-01-19 DIAGNOSIS — R109 Unspecified abdominal pain: Secondary | ICD-10-CM

## 2014-01-19 MED ORDER — HYDROCODONE-ACETAMINOPHEN 10-325 MG PO TABS: 1.0000 | ORAL_TABLET | Freq: Four times a day (QID) | ORAL | Status: DC | PRN

## 2014-01-19 MED ORDER — MORPHINE SULFATE 15 MG PO TABS: 15.0000 mg | ORAL_TABLET | ORAL | Status: AC | PRN

## 2014-01-19 NOTE — Progress Notes (Signed)
Paracentesis complete no signs of distress. 2200 ml yellow abdominal fluid removed.  

## 2014-01-19 NOTE — Patient Instructions (Signed)
[facility] Discharge Instructions  RECOMMENDATIONS MADE BY THE CONSULTANT AND ANY TEST RESULTS WILL BE SENT TO YOUR REFERRING PHYSICIAN.  You have been given a new prescription for pain control (morphine).  Please discontinue the hydrocodone and try the morphine for pain control.  If the morphine does not help with your pain control, you may switch back to your hydrocodone for pain management.  DO NOT TAKE THESE MEDICATIONS TOGETHER.  Please return in 2 weeks for a follow-up visit with Kirby Crigler, PA-C.   Thank you for choosing [facility] to provide your oncology and hematology care.  To afford each patient quality time with our providers, please arrive at least 15 minutes before your scheduled appointment time.  With your help, our goal is to use those 15 minutes to complete the necessary work-up to ensure our physicians have the information they need to help with your evaluation and healthcare recommendations.    Effective January 1st, 2014, we ask that you re-schedule your appointment with our physicians should you arrive 10 or more minutes late for your appointment.  We strive to give you quality time with our providers, and arriving late affects you and other patients whose appointments are after yours.    Again, thank you for choosing [facility].  Our hope is that these requests will decrease the amount of time that you wait before being seen by our physicians.       _____________________________________________________________  Should you have questions after your visit to [facility], please contact our office at [phone] between the hours of 8:30 a.m. and 5:00 p.m.  Voicemails left after 4:30 p.m. will not be returned until the following business day.  For prescription refill requests, have your pharmacy contact our office with your prescription refill request.

## 2014-01-19 NOTE — Procedures (Signed)
PreOperative Dx: Pancreatic cancer, ascites Postoperative Dx: Pancreatic cancer, ascites Procedure:   US guided paracentesis Radiologist:  Thornton Papas Anesthesia:  10 ml of 1% lidocaine Specimen:  2200 ml of yellow ascitic fluid EBL:   < 1 ml Complications: None

## 2014-01-23 ENCOUNTER — Other Ambulatory Visit (HOSPITAL_COMMUNITY): Payer: Self-pay | Admitting: Oncology

## 2014-01-23 DIAGNOSIS — R609 Edema, unspecified: Secondary | ICD-10-CM

## 2014-01-23 DIAGNOSIS — C259 Malignant neoplasm of pancreas, unspecified: Secondary | ICD-10-CM

## 2014-01-23 MED ORDER — FUROSEMIDE 40 MG PO TABS: 40.0000 mg | ORAL_TABLET | Freq: Every day | ORAL | Status: DC

## 2014-01-23 MED ORDER — POTASSIUM CHLORIDE CRYS ER 20 MEQ PO TBCR: 20.0000 meq | EXTENDED_RELEASE_TABLET | Freq: Every day | ORAL | Status: DC

## 2014-01-26 ENCOUNTER — Encounter (HOSPITAL_COMMUNITY): Payer: Self-pay | Admitting: Emergency Medicine

## 2014-01-26 ENCOUNTER — Telehealth (HOSPITAL_COMMUNITY): Payer: Self-pay

## 2014-01-26 ENCOUNTER — Ambulatory Visit (HOSPITAL_COMMUNITY)
Admission: RE | Admit: 2014-01-26 | Discharge: 2014-01-26 | Disposition: A | Discharge status: Home / Self Care | Payer: Medicare Other | Source: Ambulatory Visit | Attending: Oncology | Admitting: Oncology

## 2014-01-26 ENCOUNTER — Emergency Department (HOSPITAL_COMMUNITY): Payer: Medicare Other

## 2014-01-26 ENCOUNTER — Emergency Department (HOSPITAL_COMMUNITY)
Admission: EM | Admit: 2014-01-26 | Discharge: 2014-01-27 | Disposition: A | Discharge status: Home / Self Care | Payer: Medicare Other | Attending: Emergency Medicine | Admitting: Emergency Medicine

## 2014-01-26 ENCOUNTER — Encounter (HOSPITAL_COMMUNITY): Payer: Self-pay

## 2014-01-26 ENCOUNTER — Other Ambulatory Visit: Payer: Self-pay

## 2014-01-26 DIAGNOSIS — F411 Generalized anxiety disorder: Secondary | ICD-10-CM | POA: Insufficient documentation

## 2014-01-26 DIAGNOSIS — C801 Malignant (primary) neoplasm, unspecified: Secondary | ICD-10-CM | POA: Insufficient documentation

## 2014-01-26 DIAGNOSIS — R0609 Other forms of dyspnea: Secondary | ICD-10-CM | POA: Insufficient documentation

## 2014-01-26 DIAGNOSIS — G8929 Other chronic pain: Secondary | ICD-10-CM | POA: Insufficient documentation

## 2014-01-26 DIAGNOSIS — Z87891 Personal history of nicotine dependence: Secondary | ICD-10-CM | POA: Insufficient documentation

## 2014-01-26 DIAGNOSIS — R1906 Epigastric swelling, mass or lump: Secondary | ICD-10-CM | POA: Insufficient documentation

## 2014-01-26 DIAGNOSIS — R0989 Other specified symptoms and signs involving the circulatory and respiratory systems: Principal | ICD-10-CM | POA: Insufficient documentation

## 2014-01-26 DIAGNOSIS — Z8669 Personal history of other diseases of the nervous system and sense organs: Secondary | ICD-10-CM | POA: Insufficient documentation

## 2014-01-26 DIAGNOSIS — R188 Other ascites: Secondary | ICD-10-CM | POA: Insufficient documentation

## 2014-01-26 DIAGNOSIS — R109 Unspecified abdominal pain: Secondary | ICD-10-CM | POA: Insufficient documentation

## 2014-01-26 DIAGNOSIS — C799 Secondary malignant neoplasm of unspecified site: Secondary | ICD-10-CM

## 2014-01-26 DIAGNOSIS — I10 Essential (primary) hypertension: Secondary | ICD-10-CM | POA: Insufficient documentation

## 2014-01-26 DIAGNOSIS — M129 Arthropathy, unspecified: Secondary | ICD-10-CM | POA: Insufficient documentation

## 2014-01-26 DIAGNOSIS — R0600 Dyspnea, unspecified: Secondary | ICD-10-CM

## 2014-01-26 DIAGNOSIS — Z792 Long term (current) use of antibiotics: Secondary | ICD-10-CM | POA: Insufficient documentation

## 2014-01-26 DIAGNOSIS — Z8719 Personal history of other diseases of the digestive system: Secondary | ICD-10-CM | POA: Insufficient documentation

## 2014-01-26 DIAGNOSIS — E119 Type 2 diabetes mellitus without complications: Secondary | ICD-10-CM | POA: Insufficient documentation

## 2014-01-26 DIAGNOSIS — Z862 Personal history of diseases of the blood and blood-forming organs and certain disorders involving the immune mechanism: Secondary | ICD-10-CM | POA: Insufficient documentation

## 2014-01-26 DIAGNOSIS — Z794 Long term (current) use of insulin: Secondary | ICD-10-CM | POA: Insufficient documentation

## 2014-01-26 DIAGNOSIS — Z79899 Other long term (current) drug therapy: Secondary | ICD-10-CM | POA: Insufficient documentation

## 2014-01-26 DIAGNOSIS — C7889 Secondary malignant neoplasm of other digestive organs: Secondary | ICD-10-CM | POA: Insufficient documentation

## 2014-01-26 DIAGNOSIS — R000 Tachycardia, unspecified: Secondary | ICD-10-CM | POA: Insufficient documentation

## 2014-01-26 DIAGNOSIS — N390 Urinary tract infection, site not specified: Secondary | ICD-10-CM | POA: Insufficient documentation

## 2014-01-26 DIAGNOSIS — C259 Malignant neoplasm of pancreas, unspecified: Secondary | ICD-10-CM

## 2014-01-26 HISTORY — PX: PARACENTESIS: SHX5436

## 2014-01-26 LAB — URINALYSIS, ROUTINE W REFLEX MICROSCOPIC
Bilirubin Urine: NEGATIVE
Glucose, UA: 100 mg/dL — AB
Ketones, ur: 15 mg/dL — AB
NITRITE: POSITIVE — AB
PH: 7 (ref 5.0–8.0)
Protein, ur: >300 mg/dL — AB
SPECIFIC GRAVITY, URINE: 1.02 (ref 1.005–1.030)
Urobilinogen, UA: >8 mg/dL — ABNORMAL HIGH (ref 0.0–1.0)

## 2014-01-26 LAB — I-STAT TROPONIN, ED
Troponin i, poc: 0 ng/mL (ref 0.00–0.08)
Troponin i, poc: 0.02 ng/mL (ref 0.00–0.08)
Troponin i, poc: 0.02 ng/mL (ref 0.00–0.08)

## 2014-01-26 LAB — CBC WITH DIFFERENTIAL/PLATELET
BASOS PCT: 0 % (ref 0–1)
Basophils Absolute: 0 10*3/uL (ref 0.0–0.1)
EOS PCT: 1 % (ref 0–5)
Eosinophils Absolute: 0.3 10*3/uL (ref 0.0–0.7)
HEMATOCRIT: 28.8 % — AB (ref 36.0–46.0)
HEMOGLOBIN: 9.3 g/dL — AB (ref 12.0–15.0)
LYMPHS ABS: 2.1 10*3/uL (ref 0.7–4.0)
Lymphocytes Relative: 7 % — ABNORMAL LOW (ref 12–46)
MCH: 27.5 pg (ref 26.0–34.0)
MCHC: 32.3 g/dL (ref 30.0–36.0)
MCV: 85.2 fL (ref 78.0–100.0)
MONO ABS: 3.1 10*3/uL — AB (ref 0.1–1.0)
MONOS PCT: 10 % (ref 3–12)
Neutro Abs: 25 10*3/uL — ABNORMAL HIGH (ref 1.7–7.7)
Neutrophils Relative %: 82 % — ABNORMAL HIGH (ref 43–77)
PLATELETS: 248 10*3/uL (ref 150–400)
RBC: 3.38 MIL/uL — ABNORMAL LOW (ref 3.87–5.11)
RDW: 21.3 % — ABNORMAL HIGH (ref 11.5–15.5)
WBC: 30.5 10*3/uL — AB (ref 4.0–10.5)

## 2014-01-26 LAB — URINE MICROSCOPIC-ADD ON

## 2014-01-26 LAB — COMPREHENSIVE METABOLIC PANEL
ALT: 19 U/L (ref 0–35)
AST: 30 U/L (ref 0–37)
Albumin: 1.7 g/dL — ABNORMAL LOW (ref 3.5–5.2)
Alkaline Phosphatase: 304 U/L — ABNORMAL HIGH (ref 39–117)
BUN: 32 mg/dL — ABNORMAL HIGH (ref 6–23)
CALCIUM: 9.4 mg/dL (ref 8.4–10.5)
CHLORIDE: 94 meq/L — AB (ref 96–112)
CO2: 24 meq/L (ref 19–32)
CREATININE: 1.35 mg/dL — AB (ref 0.50–1.10)
GFR, EST AFRICAN AMERICAN: 47 mL/min — AB (ref >90)
GFR, EST NON AFRICAN AMERICAN: 40 mL/min — AB (ref >90)
GLUCOSE: 128 mg/dL — AB (ref 70–99)
Potassium: 4.6 mEq/L (ref 3.7–5.3)
Sodium: 133 mEq/L — ABNORMAL LOW (ref 137–147)
Total Bilirubin: 1.2 mg/dL (ref 0.3–1.2)
Total Protein: 8 g/dL (ref 6.0–8.3)

## 2014-01-26 LAB — PROTIME-INR
INR: 1.23 (ref 0.00–1.49)
Prothrombin Time: 15.2 seconds (ref 11.6–15.2)

## 2014-01-26 MED ORDER — DEXTROSE 5 % IV SOLN
1.0000 g | Freq: Once | INTRAVENOUS | Status: AC
  Administered 2014-01-26: 1 g via INTRAVENOUS
  Filled 2014-01-26: qty 10

## 2014-01-26 MED ORDER — CEPHALEXIN 500 MG PO CAPS: ORAL_CAPSULE | ORAL | Status: AC

## 2014-01-26 MED ORDER — IOHEXOL 350 MG/ML SOLN
100.0000 mL | Freq: Once | INTRAVENOUS | Status: AC | PRN
  Administered 2014-01-26: 100 mL via INTRAVENOUS

## 2014-01-26 MED ORDER — HYDROCODONE-ACETAMINOPHEN 5-325 MG PO TABS
2.0000 | ORAL_TABLET | Freq: Once | ORAL | Status: AC
  Administered 2014-01-26: 2 via ORAL
  Filled 2014-01-26: qty 2

## 2014-01-26 MED ORDER — HYDROMORPHONE HCL PF 1 MG/ML IJ SOLN
0.5000 mg | Freq: Once | INTRAMUSCULAR | Status: AC
  Administered 2014-01-26: 0.5 mg via INTRAVENOUS
  Filled 2014-01-26: qty 1

## 2014-01-26 MED ORDER — SODIUM CHLORIDE 0.9 % IV BOLUS (SEPSIS)
2000.0000 mL | Freq: Once | INTRAVENOUS | Status: AC
  Administered 2014-01-26: 2000 mL via INTRAVENOUS

## 2014-01-26 NOTE — Progress Notes (Signed)
Patient stood up from stretcher and became short of breath. Patient asked if she would like to go to the ER to be checked out. Patient taken to ER room 16 report given to [facility]. Vitals stable additional set of vitals taken upon arrival at ER.

## 2014-01-26 NOTE — ED Notes (Signed)
Pt c/o SOB which began shortly after having paracentesis. Pt had 2L removed today. Pt states "when I stood up I became very SOB". Pt states symptoms have since subsided. Pt denies chest pain during this episode. However, pt does reports left sided chest pain along with SOB earlier this morning which she states lasted approximately 3 minutes. Pt describes this pain as burning and pulling.

## 2014-01-26 NOTE — ED Provider Notes (Signed)
CSN: [id number]     Arrival date & time 01/26/14  1506 History   This chart was scribed for Autumn Relic, MD by [name], ED scribe. This patient was seen in room APA16A/APA16A and the patient's care was started at 1506.  Chief Complaint  Patient presents with  . Shortness of Breath   The history is provided by the patient. No language interpreter was used.   HPI Comments: [name] is a 68 y.o. female who presents to the Emergency Department w/hx of metastatic pancreatic CA complaining of intermittent, gradually worsened, moderate, exertional SOB, onset 1 week ago, SOB while walking around and not at rest. She reports SOB is similar to previous episodes. She was sent here from cancer center today after she had 2 quarts of fluid removed with her paracentesis today. After having several minutes of shortness of breath after her paracentesis today her symptoms completely resolved and she feels now back to baseline .She also reports over the last week as noticed a new firm mass over her abdomen which is painful. She reports at baseline she has chronic abdominal pain and swelling and usually has a paracentesis procedure once per week. She denies syncope, CP, cough, fever and lower leg edema. She is no headache confusion or new focal or lateralizing weakness or numbness.  Past Medical History  Diagnosis Date  . Diabetes mellitus without complication   . Hypertension   . Anxiety   . Arthritis   . Pancreatic cancer   . 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 Peripheral neuropathy 10/13/2013  . Liver abscess 11/04/2013   Past Surgical History  Procedure Laterality Date  . Abdominal hysterectomy    . Colonoscopy  2006    Dr. Gala Romney, polyp, benign  . Ercp  05/10/2013    Procedure: ENDOSCOPIC RETROGRADE CHOLANGIOPANCREATOGRAPHY (ERCP)(DIFFICULT CANNULATION) ;  Surgeon: Rogene Houston, MD;  Location: [facility];  Service: Endoscopy;;  . Biliary stent placement  05/10/2013     Procedure: BILIARY STENT PLACEMENT (10MM X 80MM WALLFLEX STENT);  Surgeon: Rogene Houston, MD;  Location: [facility];  Service: Endoscopy;;  . Sphincterotomy  05/10/2013    Procedure: SPHINCTEROTOMY (PRECUT MADE WITH NEEDLE KNIFE);  Surgeon: Rogene Houston, MD;  Location: [facility];  Service: Endoscopy;;  . Liver biopsy  05/12/2013  . Portacath placement Left 05/23/2013    Procedure: INSERTION PORT-A-CATH;  Surgeon: Donato Heinz, MD;  Location: [facility];  Service: General;  Laterality: Left;  . Paracentesis Right 01/26/14   Family History  Problem Relation Age of Onset  . Colon cancer Neg Hx   . Liver disease Neg Hx   . Bone cancer Brother   . Cancer Brother   . Hypertension Mother    History  Substance Use Topics  . Smoking status: Former Smoker -- 1.00 packs/Bonilla for 20 years  . Smokeless tobacco: Former Systems developer    Quit date: 03/27/2013  . Alcohol Use: No   OB History   Grav Para Term Preterm Abortions TAB SAB Ect Mult Living   5 5 5       5      Review of Systems  All other systems reviewed and are negative.   A complete 10 system review of systems was obtained and all systems are negative except as noted in the HPI and PMH.   Allergies  Oxycodone hcl and Levaquin  Home Medications   Current Outpatient Rx  Name  Route  Sig  Dispense  Refill  .  acetaminophen (TYLENOL) 325 MG tablet   Oral   Take 2 tablets (650 mg total) by mouth every 6 (six) hours as needed for mild pain (or Fever >/= 101).         Marland Kitchen ALPRAZolam (XANAX) 1 MG tablet   Oral   Take 1 mg by mouth 3 (three) times daily as needed for sleep or anxiety.         Marland Kitchen amLODipine (NORVASC) 5 MG tablet   Oral   Take 5 mg by mouth daily.         . capecitabine (XELODA) 500 MG tablet   Oral   Take 1,000 mg by mouth 2 (two) times daily. To take for 7 days, then stop for 7 days         . furosemide (LASIX) 40 MG tablet   Oral   Take 40 mg by mouth daily.         Marland Kitchen HYDROcodone-acetaminophen (NORCO) 10-325 MG  per tablet   Oral   Take 1 tablet by mouth every 6 (six) hours as needed for moderate pain.         Marland Kitchen insulin glargine (LANTUS) 100 UNIT/ML injection   Subcutaneous   Inject 20 Units into the skin at bedtime as needed (patient only takes if blood sugar levels are over 100.).         Marland Kitchen losartan (COZAAR) 100 MG tablet   Oral   Take 100 mg by mouth daily.         Marland Kitchen morphine (MSIR) 15 MG tablet   Oral   Take 1-2 tablets (15-30 mg total) by mouth every 4 (four) hours as needed for severe pain.   60 tablet   0   . omeprazole (PRILOSEC) 20 MG capsule   Oral   Take 1 capsule (20 mg total) by mouth daily.   30 capsule   3   . potassium chloride SA (K-DUR,KLOR-CON) 20 MEQ tablet   Oral   Take 20 mEq by mouth daily.         . cephALEXin (KEFLEX) 500 MG capsule      2 caps po bid x 7 days   28 capsule   0   . insulin glargine (LANTUS) 100 UNIT/ML injection   Subcutaneous   Inject 0.05 mLs (5 Units total) into the skin at bedtime.   10 mL   12    Triage Vitals: BP 102/76  Pulse 72  Temp(Src) 97.9 F (36.6 C) (Oral)  Resp 16  SpO2 100%  Physical Exam  Nursing note and vitals reviewed. Constitutional:  Awake, alert, nontoxic appearance.  HENT:  Head: Atraumatic.  Eyes: Right eye exhibits no discharge. Left eye exhibits no discharge.  Neck: Neck supple.  Cardiovascular: Regular rhythm and normal heart sounds.   No murmur heard. Tachycardic   Pulmonary/Chest: Effort normal and breath sounds normal. No respiratory distress. She has no wheezes. She has no rales. She exhibits no tenderness.  Abdominal: Soft. Bowel sounds are normal. There is no tenderness. There is no rebound.  Mildly tender epigastric mass. Rest of abdomen is non-tender  Musculoskeletal: She exhibits no tenderness.  Baseline ROM, no obvious new focal weakness.  Neurological:  Mental status and motor strength appears baseline for patient and situation.  Skin: No rash noted.  Psychiatric: She has  a normal mood and affect.   ED Course  Procedures (including critical care time) DIAGNOSTIC STUDIES: Oxygen Saturation is 100% on room air, normal by my interpretation.  COORDINATION OF CARE: At 350 PM Discussed treatment plan with patient which includes EKG. Patient agrees.  Patient aware patient's epigastric mass appears to be her cancer. Pt stable in ED with no significant deterioration in condition.Patient / Family / Caregiver informed of clinical course, understand medical decision-making process, and agree with plan.  Labs Review Labs Reviewed  CBC WITH DIFFERENTIAL - Abnormal; Notable for the following:    WBC 30.5 (*)    RBC 3.38 (*)    Hemoglobin 9.3 (*)    HCT 28.8 (*)    RDW 21.3 (*)    Neutrophils Relative % 82 (*)    Lymphocytes Relative 7 (*)    Neutro Abs 25.0 (*)    Monocytes Absolute 3.1 (*)    All other components within normal limits  COMPREHENSIVE METABOLIC PANEL - Abnormal; Notable for the following:    Sodium 133 (*)    Chloride 94 (*)    Glucose, Bld 128 (*)    BUN 32 (*)    Creatinine, Ser 1.35 (*)    Albumin 1.7 (*)    Alkaline Phosphatase 304 (*)    GFR calc non Af Amer 40 (*)    GFR calc Af Amer 47 (*)    All other components within normal limits  URINALYSIS, ROUTINE W REFLEX MICROSCOPIC - Abnormal; Notable for the following:    Color, Urine RED (*)    APPearance HAZY (*)    Glucose, UA 100 (*)    Hgb urine dipstick LARGE (*)    Ketones, ur 15 (*)    Protein, ur >300 (*)    Urobilinogen, UA >8.0 (*)    Nitrite POSITIVE (*)    Leukocytes, UA MODERATE (*)    All other components within normal limits  URINE MICROSCOPIC-ADD ON - Abnormal; Notable for the following:    Bacteria, UA MANY (*)    All other components within normal limits  URINE CULTURE  PROTIME-INR  I-STAT TROPOININ, ED  I-STAT TROPOININ, ED  I-STAT TROPOININ, ED   Imaging Review No results found.   EKG Interpretation   Date/Time:  Monday January 26 2014 15:31:18  EST Ventricular Rate:  114 PR Interval:  134 QRS Duration: 90 QT Interval:  316 QTC Calculation: 435 R Axis:   36 Text Interpretation:  Sinus tachycardia Left ventricular hypertrophy T  wave abnormality, consider inferior ischemia Abnormal ECG When compared  with ECG of 29-Oct-2013 14:55, Inverted T waves have replaced nonspecific  T wave abnormality in Inferior leads Confirmed by [facility]  MD, Jenny Reichmann ([id number])  on 01/26/2014 3:39:19 PM      MDM   Final diagnoses:  Dyspnea  Metastatic cancer  UTI (urinary tract infection)  Chronic abdominal pain   I doubt any other EMC precluding discharge at this time including, but not necessarily limited to the following:AMI, PE, sepsis. I personally performed the services described in this documentation, which was scribed in my presence. The recorded information has been reviewed and is accurate.      Autumn Relic, MD 01/29/14 2100

## 2014-01-26 NOTE — Progress Notes (Signed)
Paracentesis complete no sign of distress. 1900 ml greenish yellow abdominal fluid removed.

## 2014-01-26 NOTE — Telephone Encounter (Signed)
Patient called the cancer center and stated that "i need to be tapped again", rn spoke to [place] in [place] [facility] and he will return the call.  Tom, pa notified.

## 2014-01-26 NOTE — Telephone Encounter (Signed)
pt notified to be in xray at 1:15 today

## 2014-01-26 NOTE — Discharge Instructions (Signed)
Not every illness or injury can be identified during an emergency department visit, thus follow-up with your primary healthcare provider is important. Medical conditions can also worsen, so it is also important to return immediately as directed below, or if you have other serious concerns develop. °RETURN IMMEDIATELY IF you develop new shortness of breath, chest pain, fever, have difficulty moving parts of your body (new weakness, numbness, or incoordination), sudden change in speech, vision, swallowing, or understanding, faint or develop new dizziness, severe headache, become poorly responsive or have an altered mental status compared to baseline for you, new rash, abdominal pain, or bloody stools,  °Return sooner also if you develop new problems for which you have not talked to your caregiver but you feel may be emergency medical conditions, or are unable to be cared for safely at home. °

## 2014-01-27 MED ORDER — HEPARIN SOD (PORK) LOCK FLUSH 100 UNIT/ML IV SOLN
INTRAVENOUS | Status: AC
  Administered 2014-01-27: 01:00:00
  Filled 2014-01-27: qty 5

## 2014-01-28 ENCOUNTER — Other Ambulatory Visit (HOSPITAL_COMMUNITY): Payer: Self-pay | Admitting: *Deleted

## 2014-01-28 DIAGNOSIS — C259 Malignant neoplasm of pancreas, unspecified: Secondary | ICD-10-CM

## 2014-01-28 DIAGNOSIS — R531 Weakness: Secondary | ICD-10-CM

## 2014-01-28 LAB — URINE CULTURE
Colony Count: NO GROWTH
Culture: NO GROWTH

## 2014-01-29 ENCOUNTER — Encounter (HOSPITAL_COMMUNITY): Payer: Self-pay

## 2014-01-29 ENCOUNTER — Ambulatory Visit (HOSPITAL_COMMUNITY): Payer: Medicare Other

## 2014-01-29 ENCOUNTER — Encounter (HOSPITAL_COMMUNITY): Payer: Medicare Other | Attending: Oncology

## 2014-01-29 VITALS — BP 91/65 | HR 120 | Temp 97.9°F | Resp 22 | Wt 126.7 lb

## 2014-01-29 DIAGNOSIS — I10 Essential (primary) hypertension: Secondary | ICD-10-CM

## 2014-01-29 DIAGNOSIS — E119 Type 2 diabetes mellitus without complications: Secondary | ICD-10-CM

## 2014-01-29 DIAGNOSIS — G609 Hereditary and idiopathic neuropathy, unspecified: Secondary | ICD-10-CM

## 2014-01-29 DIAGNOSIS — R180 Malignant ascites: Secondary | ICD-10-CM

## 2014-01-29 DIAGNOSIS — G629 Polyneuropathy, unspecified: Secondary | ICD-10-CM

## 2014-01-29 DIAGNOSIS — C259 Malignant neoplasm of pancreas, unspecified: Secondary | ICD-10-CM | POA: Insufficient documentation

## 2014-01-29 DIAGNOSIS — C787 Secondary malignant neoplasm of liver and intrahepatic bile duct: Secondary | ICD-10-CM | POA: Insufficient documentation

## 2014-01-29 NOTE — Patient Instructions (Addendum)
[name] Discharge Instructions  RECOMMENDATIONS MADE BY THE CONSULTANT AND ANY TEST RESULTS WILL BE SENT TO YOUR REFERRING PHYSICIAN.  EXAM FINDINGS BY THE PHYSICIAN TODAY AND SIGNS OR SYMPTOMS TO REPORT TO CLINIC OR PRIMARY PHYSICIAN: Exam and findings as discussed by Dr. Barnet Glasgow.  Restart the xeloda and take it with food.  Will get you scheduled for paracenteses weekly on Mondays.  MEDICATIONS PRESCRIBED:  Xeloda 1000 mg twice daily 7 days on and 7 days off.  If you get indigestion take an antacid.  INSTRUCTIONS/FOLLOW-UP:  Follow-up in 2 weeks.   Thank you for choosing [facility] to provide your oncology and hematology care.  To afford each patient quality time with our providers, please arrive at least 15 minutes before your scheduled appointment time.  With your help, our goal is to use those 15 minutes to complete the necessary work-up to ensure our physicians have the information they need to help with your evaluation and healthcare recommendations.    Effective January 1st, 2014, we ask that you re-schedule your appointment with our physicians should you arrive 10 or more minutes late for your appointment.  We strive to give you quality time with our providers, and arriving late affects you and other patients whose appointments are after yours.    Again, thank you for choosing [facility].  Our hope is that these requests will decrease the amount of time that you wait before being seen by our physicians.       _____________________________________________________________  Should you have questions after your visit to [facility], please contact our office at [phone] between the hours of 8:30 a.m. and 5:00 p.m.  Voicemails left after 4:30 p.m. will not be returned until the following business day.  For prescription refill requests, have your pharmacy contact our office with your prescription refill request.

## 2014-01-29 NOTE — Progress Notes (Signed)
[facility]  OFFICE PROGRESS NOTE  Robert Bellow, MD [address] [place] [address]  DIAGNOSIS: Pancreatic cancer metastasized to liver - Plan: US Paracentesis, Ferritin  Malignant ascites  Diabetes mellitus  Peripheral neuropathy  Chief Complaint  Patient presents with  . Pancreatic cancer with malignant ascites and liver metastase    Xeloda for 7 days on and 7 days off    CURRENT THERAPY: Xeloda for 7 days on and 7 days off  INTERVAL HISTORY: [name] 68 y.o. female returns for followup of progressive carcinoma of pancreas with liver metastases and malignant ascites, requiring periodic (weekly) paracentesis for comfort having undergone drainage for a liver abscess 11/04/2013. Last CAT scan done 12/30/2013 showed evidence of progression at that time her treatment was changed to salvage Xeloda 7 days on and 7 days off per request of the patient. She spaces considerable heartburn while taking Xeloda but she is taking it a half an hour after she eats. She is short of breath at rest and manifests orthopnea. She denies any fever, night sweats, lower extremity swelling or redness but has had redness of her palms with dry skin. She denies any cough, wheezing, chest pain, PND, palpitations, headache, or seizures. Last paracentesis was done 3 days ago on 01/26/2014.  MEDICAL HISTORY: Past Medical History  Diagnosis Date  . Diabetes mellitus without complication   . Hypertension   . Anxiety   . Arthritis   . Pancreatic cancer   . 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 Peripheral neuropathy 10/13/2013  . Liver abscess 11/04/2013    INTERIM HISTORY: has Obstructive jaundice; Tobacco abuse; HTN (hypertension); Diabetes mellitus; Malignant neoplasm of pancreas, part unspecified; Antineoplastic chemotherapy induced anemia(285.3); Peripheral neuropathy; SIRS (systemic inflammatory response syndrome);  Hepatic abscess; Insomnia; and Weakness on her problem list.   Metastatic pancreatic cancer S/P 5 cycles of FOLFIRINOX chemotherapy with stable disease per CT scans, but recurrent ascites worrisome for progression of disease. She was treated with FOLFIRINOX + Neulasta support from 05/26/2013- 07/21/2013 with a change in therapy to Gemcitabine/Abraxane D1, 8, 15 every 28 days beginning on 08/18/2013 because of lack of response. Due to thrombocytopenia for Day 15 of cycle 1, Day 15 of therapy was deleted from all subsequent cycles. Restaging CT scans following cycle 3 of Gemcitabine/Abraxane demonstrate stable to improved disease. After the [facility], she was noted to require weekly paracenteses and near the end of Jan 2015, she was needing paracenteses twice weekly. Therefore, restaging CT scans were moved and completed on 12/30/2013. CT scans demonstrated progression of disease. Therefore, therapy was changed to Salvage Xeloda alone 1000 mg in AM and PM beginning on 01/12/2014 along with periodic therapeutic paracenteses.   Malignant neoplasm of pancreas, part unspecified    05/12/2013  Initial Diagnosis  Malignant neoplasm of pancreas, adenocarcinoma. Liver biopsy    05/26/2013 - 07/21/2013  Chemotherapy  FOLFIRINOX chemotherapy. 5 cycles with stable disease per CT scans, but recurrent malignant ascites worrisome for progression.    07/22/2013  Progression  Recurrent ascites    08/18/2013 - 12/08/2013  Chemotherapy  Abraxane/Gemcitabine D1 + D8 every 28 days. Day 15 cancelled and deleted due to thrombocytopenia for Day 15 of cycle 1. Completed 4 cycles    11/04/2013 - 11/13/2013  Hospital Admission  Percutaneous drainage of intrahepatic abscess by IR.    11/24/2013  Progression  Weekly paracenteses for malignant ascites becoming more frequent and nearly twice per week  at the end of Jan 2015.    12/30/2013  Imaging  CT CAP for restaging demonstrates progrssion of disease    01/12/2014 -  Chemotherapy  Xeloda 100 mg  in AM and 1000 mg in PM 7 days on and 7 days off. Salvage therapy      ALLERGIES:  is allergic to oxycodone hcl and levaquin.  MEDICATIONS: has a current medication list which includes the following prescription(s): acetaminophen, alprazolam, amlodipine, cephalexin, furosemide, hydrocodone-acetaminophen, insulin glargine, insulin glargine, losartan, omeprazole, potassium chloride sa, capecitabine, and morphine.  SURGICAL HISTORY:  Past Surgical History  Procedure Laterality Date  . Abdominal hysterectomy    . Colonoscopy  2006    Dr. Gala Romney, polyp, benign  . Ercp  05/10/2013    Procedure: ENDOSCOPIC RETROGRADE CHOLANGIOPANCREATOGRAPHY (ERCP)(DIFFICULT CANNULATION) ;  Surgeon: Rogene Houston, MD;  Location: [facility];  Service: Endoscopy;;  . Biliary stent placement  05/10/2013    Procedure: BILIARY STENT PLACEMENT (10MM X 80MM WALLFLEX STENT);  Surgeon: Rogene Houston, MD;  Location: [facility];  Service: Endoscopy;;  . Sphincterotomy  05/10/2013    Procedure: SPHINCTEROTOMY (PRECUT MADE WITH NEEDLE KNIFE);  Surgeon: Rogene Houston, MD;  Location: [facility];  Service: Endoscopy;;  . Liver biopsy  05/12/2013  . Portacath placement Left 05/23/2013    Procedure: INSERTION PORT-A-CATH;  Surgeon: Donato Heinz, MD;  Location: [facility];  Service: General;  Laterality: Left;  . Paracentesis Right 01/26/14    FAMILY HISTORY: family history includes Bone cancer in her brother; Cancer in her brother; Hypertension in her mother. There is no history of Colon cancer or Liver disease.  SOCIAL HISTORY:  reports that she has quit smoking. She quit smokeless tobacco use about 10 months ago. She reports that she does not drink alcohol or use illicit drugs.  REVIEW OF SYSTEMS:  Other than that discussed above is noncontributory.  PHYSICAL EXAMINATION: ECOG PERFORMANCE STATUS: 2 - Symptomatic, <50% confined to bed  Blood pressure 91/65, pulse 120, temperature 97.9 F (36.6 C), temperature source Oral, resp. rate  22, weight 126 lb 11.2 oz (57.471 kg), SpO2 100.00%.  GENERAL:alert, no distress and comfortable SKIN: skin color, texture, turgor are normal, no rashes or significant lesions EYES: PERLA; Conjunctiva are pink and non-injected, sclera clear OROPHARYNX:no exudate, no erythema on lips, buccal mucosa, or tongue. NECK: supple, thyroid normal size, non-tender, without nodularity. No masses CHEST: LifePort in place LYMPH:  no palpable lymphadenopathy in the cervical, axillary or inguinal LUNGS: clear to auscultation and percussion with normal breathing effort at rest. HEART: regular rate & rhythm and no murmurs. ABDOMEN:abdomen soft, non-tender and normal bowel sounds. Positive fluid wave and shifting dullness but not thought. MUSCULOSKELETAL:no cyanosis of digits and no clubbing. Range of motion normal. Dry palms without exfoliation or erythema. NEURO: alert & oriented x 3 with fluent speech, no focal motor/sensory deficits   LABORATORY DATA: Admission on 01/26/2014, Discharged on 01/27/2014  Component Date Value Ref Range Status  . WBC 01/26/2014 30.5* 4.0 - 10.5 K/uL Final  . RBC 01/26/2014 3.38* 3.87 - 5.11 MIL/uL Final  . Hemoglobin 01/26/2014 9.3* 12.0 - 15.0 g/dL Final  . HCT 01/26/2014 28.8* 36.0 - 46.0 % Final  . MCV 01/26/2014 85.2  78.0 - 100.0 fL Final  . MCH 01/26/2014 27.5  26.0 - 34.0 pg Final  . MCHC 01/26/2014 32.3  30.0 - 36.0 g/dL Final  . RDW 01/26/2014 21.3* 11.5 - 15.5 % Final  . Platelets 01/26/2014 248  150 - 400  K/uL Final  . Neutrophils Relative % 01/26/2014 82* 43 - 77 % Final  . Lymphocytes Relative 01/26/2014 7* 12 - 46 % Final  . Monocytes Relative 01/26/2014 10  3 - 12 % Final  . Eosinophils Relative 01/26/2014 1  0 - 5 % Final  . Basophils Relative 01/26/2014 0  0 - 1 % Final  . Neutro Abs 01/26/2014 25.0* 1.7 - 7.7 K/uL Final  . Lymphs Abs 01/26/2014 2.1  0.7 - 4.0 K/uL Final  . Monocytes Absolute 01/26/2014 3.1* 0.1 - 1.0 K/uL Final  . Eosinophils  Absolute 01/26/2014 0.3  0.0 - 0.7 K/uL Final  . Basophils Absolute 01/26/2014 0.0  0.0 - 0.1 K/uL Final  . WBC Morphology 01/26/2014 WHITE COUNT CONFIRMED ON SMEAR   Final   Comment: INCREASED BANDS (>20% BANDS)                          VACUOLATED NEUTROPHILS                          ATYPICAL LYMPHOCYTES  . Sodium 01/26/2014 133* 137 - 147 mEq/L Final  . Potassium 01/26/2014 4.6  3.7 - 5.3 mEq/L Final  . Chloride 01/26/2014 94* 96 - 112 mEq/L Final  . CO2 01/26/2014 24  19 - 32 mEq/L Final  . Glucose, Bld 01/26/2014 128* 70 - 99 mg/dL Final  . BUN 01/26/2014 32* 6 - 23 mg/dL Final  . Creatinine, Ser 01/26/2014 1.35* 0.50 - 1.10 mg/dL Final  . Calcium 01/26/2014 9.4  8.4 - 10.5 mg/dL Final  . Total Protein 01/26/2014 8.0  6.0 - 8.3 g/dL Final  . Albumin 01/26/2014 1.7* 3.5 - 5.2 g/dL Final  . AST 01/26/2014 30  0 - 37 U/L Final  . ALT 01/26/2014 19  0 - 35 U/L Final  . Alkaline Phosphatase 01/26/2014 304* 39 - 117 U/L Final  . Total Bilirubin 01/26/2014 1.2  0.3 - 1.2 mg/dL Final  . GFR calc non Af Amer 01/26/2014 40* >90 mL/min Final  . GFR calc Af Amer 01/26/2014 47* >90 mL/min Final   Comment: (NOTE)                          The eGFR has been calculated using the CKD EPI equation.                          This calculation has not been validated in all clinical situations.                          eGFR's persistently <90 mL/min signify possible Chronic Kidney                          Disease.  Marland Kitchen Prothrombin Time 01/26/2014 15.2  11.6 - 15.2 seconds Final  . INR 01/26/2014 1.23  0.00 - 1.49 Final  . Troponin i, poc 01/26/2014 0.00  0.00 - 0.08 ng/mL Final  . Comment 3 01/26/2014          Final   Comment: Due to the release kinetics of cTnI,                          a negative result within the first hours  of the onset of symptoms does not rule out                          myocardial infarction with certainty.                          If myocardial  infarction is still suspected,                          repeat the test at appropriate intervals.  . Troponin i, poc 01/26/2014 0.02  0.00 - 0.08 ng/mL Final  . Comment 3 01/26/2014          Final   Comment: Due to the release kinetics of cTnI,                          a negative result within the first hours                          of the onset of symptoms does not rule out                          myocardial infarction with certainty.                          If myocardial infarction is still suspected,                          repeat the test at appropriate intervals.  . Color, Urine 01/26/2014 RED* YELLOW Final   BIOCHEMICALS MAY BE AFFECTED BY COLOR  . APPearance 01/26/2014 HAZY* CLEAR Final  . Specific Gravity, Urine 01/26/2014 1.020  1.005 - 1.030 Final  . pH 01/26/2014 7.0  5.0 - 8.0 Final  . Glucose, UA 01/26/2014 100* NEGATIVE mg/dL Final  . Hgb urine dipstick 01/26/2014 LARGE* NEGATIVE Final  . Bilirubin Urine 01/26/2014 NEGATIVE  NEGATIVE Final  . Ketones, ur 01/26/2014 15* NEGATIVE mg/dL Final  . Protein, ur 01/26/2014 >300* NEGATIVE mg/dL Final  . Urobilinogen, UA 01/26/2014 >8.0* 0.0 - 1.0 mg/dL Final  . Nitrite 01/26/2014 POSITIVE* NEGATIVE Final  . Leukocytes, UA 01/26/2014 MODERATE* NEGATIVE Final  . Squamous Epithelial / LPF 01/26/2014 RARE  RARE Final  . WBC, UA 01/26/2014 3-6  <3 WBC/hpf Final  . RBC / HPF 01/26/2014 TOO NUMEROUS TO COUNT  <3 RBC/hpf Final  . Bacteria, UA 01/26/2014 MANY* RARE Final  . Troponin i, poc 01/26/2014 0.02  0.00 - 0.08 ng/mL Final  . Comment 3 01/26/2014          Final   Comment: Due to the release kinetics of cTnI,                          a negative result within the first hours                          of the onset of symptoms does not rule out                          myocardial infarction with certainty.  If myocardial infarction is still suspected,                          repeat the test at appropriate  intervals.  Marland Kitchen Specimen Description 01/26/2014 URINE, CLEAN CATCH   Final  . Special Requests 01/26/2014 NONE   Final  . Culture  Setup Time 01/26/2014    Final                   Value:01/27/2014 00:30                         Performed at [facility]  . Colony Count 01/26/2014    Final                   Value:NO GROWTH                         Performed at [facility]  . Culture 01/26/2014    Final                   Value:NO GROWTH                         Performed at [facility]  . Report Status 01/26/2014 01/28/2014 FINAL   Final  Hospital Outpatient Visit on 01/12/2014  Component Date Value Ref Range Status  . Fluid Type-FCT 01/12/2014 FLUID   Final   ASCITIC  . Color, Fluid 01/12/2014 YELLOW  YELLOW Final  . Appearance, Fluid 01/12/2014 CLOUDY* CLEAR Final  . WBC, Fluid 01/12/2014 354  0 - 1000 cu mm Final  . Neutrophil Count, Fluid 01/12/2014 2  0 - 25 % Final  . Lymphs, Fluid 01/12/2014 17   Final  . Monocyte-Macrophage-Serous Fluid 01/12/2014 76  50 - 90 % Final  . Eos, Fluid 01/12/2014 5   Final  . Path Review 01/12/2014 Reviewed By Violet Baldy, M.D.   Final   Comment: 02.17.15                          ATYPICAL CELLS PRESENT                          SUSPICOUS FOR MALIGNANCY                          CYTOLOGIC EVALUATION IS RECOMMENDED.                          Performed at [facility] Outpatient Visit on 01/12/2014  Component Date Value Ref Range Status  . Specimen Description 01/12/2014 FLUID ASCITIC   Final  . Special Requests 01/12/2014 NONE   Final  . Gram Stain 01/12/2014    Final                   Value:NO WBC SEEN                         NO ORGANISMS SEEN                         Performed at [facility]  . Culture 01/12/2014  Final                   Value:NO ANAEROBES ISOLATED                         Performed at [facility]  . Report Status 01/12/2014 01/17/2014 FINAL   Final  .  Specimen Description 01/12/2014 FLUID ASCITIC   Final  . Special Requests 01/12/2014 NONE   Final  . Gram Stain 01/12/2014    Final                   Value:NO WBC SEEN                         NO ORGANISMS SEEN                         Performed at [facility]  . Culture 01/12/2014    Final                   Value:NO GROWTH 3 DAYS                         Performed at [facility]  . Report Status 01/12/2014 01/16/2014 FINAL   Final  Infusion on 01/09/2014  Component Date Value Ref Range Status  . Sodium 01/09/2014 139  137 - 147 mEq/L Final  . Potassium 01/09/2014 3.7  3.7 - 5.3 mEq/L Final  . Chloride 01/09/2014 98  96 - 112 mEq/L Final  . CO2 01/09/2014 27  19 - 32 mEq/L Final  . Glucose, Bld 01/09/2014 135* 70 - 99 mg/dL Final  . BUN 01/09/2014 19  6 - 23 mg/dL Final  . Creatinine, Ser 01/09/2014 1.57* 0.50 - 1.10 mg/dL Final  . Calcium 01/09/2014 8.4  8.4 - 10.5 mg/dL Final  . Total Protein 01/09/2014 8.2  6.0 - 8.3 g/dL Final  . Albumin 01/09/2014 2.0* 3.5 - 5.2 g/dL Final  . AST 01/09/2014 22  0 - 37 U/L Final  . ALT 01/09/2014 12  0 - 35 U/L Final  . Alkaline Phosphatase 01/09/2014 241* 39 - 117 U/L Final  . Total Bilirubin 01/09/2014 0.5  0.3 - 1.2 mg/dL Final  . GFR calc non Af Amer 01/09/2014 34* >90 mL/min Final  . GFR calc Af Amer 01/09/2014 39* >90 mL/min Final   Comment: (NOTE)                          The eGFR has been calculated using the CKD EPI equation.                          This calculation has not been validated in all clinical situations.                          eGFR's persistently <90 mL/min signify possible Chronic Kidney                          Disease.  . WBC 01/09/2014 21.3* 4.0 - 10.5 K/uL Final  . RBC 01/09/2014 3.75* 3.87 - 5.11 MIL/uL Final  . Hemoglobin 01/09/2014 10.0* 12.0 - 15.0 g/dL Final  . HCT 01/09/2014 32.6* 36.0 - 46.0 % Final  .  MCV 01/09/2014 86.9  78.0 - 100.0 fL Final  . MCH 01/09/2014 26.7  26.0 - 34.0 pg Final    . MCHC 01/09/2014 30.7  30.0 - 36.0 g/dL Final  . RDW 01/09/2014 20.2* 11.5 - 15.5 % Final  . Platelets 01/09/2014 335  150 - 400 K/uL Final  . Neutrophils Relative % 01/09/2014 79* 43 - 77 % Final  . Lymphocytes Relative 01/09/2014 10* 12 - 46 % Final  . Monocytes Relative 01/09/2014 8  3 - 12 % Final  . Eosinophils Relative 01/09/2014 3  0 - 5 % Final  . Basophils Relative 01/09/2014 0  0 - 1 % Final  . Neutro Abs 01/09/2014 16.9* 1.7 - 7.7 K/uL Final  . Lymphs Abs 01/09/2014 2.1  0.7 - 4.0 K/uL Final  . Monocytes Absolute 01/09/2014 1.7* 0.1 - 1.0 K/uL Final  . Eosinophils Absolute 01/09/2014 0.6  0.0 - 0.7 K/uL Final  . Basophils Absolute 01/09/2014 0.0  0.0 - 0.1 K/uL Final  . WBC Morphology 01/09/2014 VACUOLATED NEUTROPHILS   Final   ATYPICAL LYMPHOCYTES  . CA 19-9 01/09/2014 >140000.0* <35.0 U/mL Final   Comment: (NOTE)                          Result repeated and verified.                          Result confirmed by automatic dilution.                          Performed at [facility]: No new pathology.  Urinalysis    Component Value Date/Time   COLORURINE RED* 01/26/2014 1932   APPEARANCEUR HAZY* 01/26/2014 1932   LABSPEC 1.020 01/26/2014 1932   PHURINE 7.0 01/26/2014 1932   GLUCOSEU 100* 01/26/2014 1932   HGBUR LARGE* 01/26/2014 1932   BILIRUBINUR NEGATIVE 01/26/2014 1932   KETONESUR 15* 01/26/2014 1932   PROTEINUR >300* 01/26/2014 1932   UROBILINOGEN >8.0* 01/26/2014 1932   NITRITE POSITIVE* 01/26/2014 1932   LEUKOCYTESUR MODERATE* 01/26/2014 1932    RADIOGRAPHIC STUDIES: Dg Chest 2 View  01/26/2014   CLINICAL DATA:  Shortness of breath.  EXAM: CHEST  2 VIEW  COMPARISON:  10/29/2013  FINDINGS: Left Port-A-Cath remains in place, unchanged. Heart is normal size. Lungs are clear. No effusions. No acute bony abnormality.  IMPRESSION: No active cardiopulmonary disease.   Electronically Signed   By: Rolm Baptise M.D.   On: 01/26/2014 17:19   Ct Angio Chest Pe W/cm  &/or Wo Cm  01/26/2014   CLINICAL DATA:  Shortness of breath after paracentesis today. History of metastatic pancreatic cancer, diabetes and hypertension. Question pulmonary embolism.  EXAM: CT ANGIOGRAPHY CHEST WITH CONTRAST  TECHNIQUE: Multidetector CT imaging of the chest was performed using the standard protocol during bolus administration of intravenous contrast. Multiplanar CT image reconstructions and MIPs were obtained to evaluate the vascular anatomy.  CONTRAST:  168m OMNIPAQUE IOHEXOL 350 MG/ML SOLN  COMPARISON:  DG CHEST 2 VIEW dated 01/26/2014; CT CHEST W/CM dated 12/30/2013; CT ABD/PELV WO/W dated 12/30/2013; [place] 01/26/2014  FINDINGS: The pulmonary arteries are well opacified with contrast. There is no evidence of acute pulmonary embolism. The thoracic aorta demonstrates no significant findings.  Left subclavian Port-A-Cath appears unchanged in the upper SVC. There are no pathologically enlarged mediastinal or hilar lymph nodes. However, there  are enlarging pericardiac lymph nodes bilaterally. There is no pleural or pericardial effusion. Left thyroid nodularity appears grossly stable.  Diffuse emphysematous changes are again noted throughout both lungs. There is peribronchovascular nodularity at both lung bases which appears slightly progressive. No confluent airspace opacity or endobronchial lesion is demonstrated.  Images through the upper abdomen again demonstrate extensive hepatic metastatic disease. There is a large left upper quadrant mass involving the pancreatic tail and left kidney. There is a small amount of free intraperitoneal air within the upper abdomen. Ascites appears decreased in volume compared with the prior CT. There are no worrisome osseous findings.  Review of the MIP images confirms the above findings.  IMPRESSION: 1. No evidence of acute pulmonary embolism. 2. Peribronchovascular nodularity in both lung bases may be slightly progressive, and early pulmonary metastases  cannot be excluded. Diffuse emphysematous changes throughout both lungs are again noted. 3. Extensive metastatic disease throughout the upper abdomen, involving the liver, pancreatic tail and the left kidney. Possible developing pericardiac nodal metastases bilaterally. 4. Decreased ascites. A small amount of free intraperitoneal air is likely related to today's paracentesis.   Electronically Signed   By: Camie Patience M.D.   On: 01/26/2014 18:37   US Abdomen Limited  01/19/2014   CLINICAL DATA:  Pancreatic cancer, question recurrent ascites  EXAM: LIMITED ABDOMEN ULTRASOUND FOR ASCITES  TECHNIQUE: Limited ultrasound survey for ascites was performed in all four abdominal quadrants.  COMPARISON:  01/12/2014  FINDINGS: Significant ascites is present in the 4 quadrants, greatest in right lower quadrant.  Volume of ascites is sufficient for paracentesis.  IMPRESSION: Adequate ascites for paracentesis.   Electronically Signed   By: Lavonia Dana M.D.   On: 01/19/2014 11:47   US Paracentesis  01/28/2014   CLINICAL DATA:  Ascites.  EXAM: ULTRASOUND GUIDED  PARACENTESIS  COMPARISON:  01/19/2014  PROCEDURE: An ultrasound guided paracentesis was thoroughly discussed with the patient and questions answered. The benefits, risks, alternatives and complications were also discussed. The patient understands and wishes to proceed with the procedure. Written consent was obtained.  Ultrasound was performed to localize and mark an adequate pocket of fluid in the right lower quadrant quadrant of the abdomen. The area was then prepped and draped in the normal sterile fashion. 1% Lidocaine was used for local anesthesia. Under ultrasound guidance a 19 gauge Yueh catheter was introduced. Paracentesis was performed. The catheter was removed and a dressing applied.  Complications: The patient became short of breath upon standing after the procedure. The patient was transferred to the emergency department for further evaluation.  FINDINGS: A  total of approximately 1900 cc of dark yellow fluid was removed. A fluid sample was not sent for laboratory analysis.  IMPRESSION: Successful ultrasound guided paracentesis yielding 1900 cc of ascites.   Electronically Signed   By: Rolm Baptise M.D.   On: 01/28/2014 15:52   US Paracentesis  01/19/2014   CLINICAL DATA:  Metastatic pancreatic neoplasm, ascites  EXAM: ULTRASOUND GUIDED THERAPEUTIC PARACENTESIS  COMPARISON:  None.  PROCEDURE: Procedure, benefits, and risks of procedure were discussed with patient.  Written informed consent for procedure was obtained.  Time out protocol followed.  Adequate collection of ascites localized in right lower quadrant.  Skin prepped and draped in usual sterile fashion.  Skin and soft tissues anesthetized with 10 mL of 1% lidocaine.  5 [place] Yueh catheter placed into peritoneal cavity.  2200 mL of yellow fluid aspirated by vacuum bottle suction.  Procedure tolerated well by patient without  immediate complication.  FINDINGS: As above  IMPRESSION: Successful ultrasound guided paracentesis yielding 2200 mL of ascites.   Electronically Signed   By: Lavonia Dana M.D.   On: 01/19/2014 18:17   US Paracentesis  01/12/2014   CLINICAL DATA:  Metastatic pancreatic cancer  EXAM: ULTRASOUND GUIDED THERAPEUTIC PARACENTESIS  COMPARISON:  None.  PROCEDURE: Procedure, benefits, and risks of procedure were discussed with patient.  Written informed consent for procedure was obtained.  Time out protocol followed.  Adequate collection of ascites localized in the right lower quadrant.  Skin prepped and draped in usual sterile fashion.  Skin and soft tissues anesthetized with 9 mL of 1% lidocaine.  5 [place] Yueh catheter placed into peritoneal cavity.  2200 mL of yellow fluid aspirated by vacuum bottle suction.  Procedure tolerated well by patient without immediate complication.  FINDINGS: A total of approximately 2200 mL of yellow fluid was removed. A fluid sample of 180 mL was sent for  laboratory analysis.  IMPRESSION: Successful ultrasound guided paracentesis yielding 2200 mL of ascites.   Electronically Signed   By: Lavonia Dana M.D.   On: 01/12/2014 17:16   US Paracentesis  01/06/2014   CLINICAL DATA:  Pancreatic cancer, ascites  EXAM: ULTRASOUND GUIDED THERAPEUTIC PARACENTESIS  COMPARISON:  12/30/2013  PROCEDURE: Procedure, benefits, and risks of procedure were discussed with patient.  Written informed consent for procedure was obtained.  Time out protocol followed.  Adequate collection of ascites localized in right lower quadrant.  Skin prepped and draped in usual sterile fashion.  Skin and soft tissues anesthetized with 10 mL of 1% lidocaine.  5 [place] Yueh catheter placed into peritoneal cavity.  2200 mL of clear yellow fluid aspirated by vacuum bottle suction.  Procedure tolerated well by patient without immediate complication.  IMPRESSION: Successful ultrasound guided paracentesis yielding 2200 mL of ascites.   Electronically Signed   By: Lavonia Dana M.D.   On: 01/06/2014 18:02   US Abdomen Limited Ruq  01/12/2014   CLINICAL DATA:  Pancreatic cancer, ascites, increased pain, fever  EXAM: US ABDOMEN LIMITED - RIGHT UPPER QUADRANT  COMPARISON:  CT abdomen 12/30/2013  FINDINGS: Gallbladder:  Sludge within gallbladder. No definite gallbladder wall thickening or sonographic Murphy sign. Ascites present.  Common bile duct:  Diameter: 3 mm diameter, normal  Liver:  Enlarged and nodular containing multiple heterogeneous soft tissue masses/nodules compatible with metastatic disease. These measure up to 5.8 cm in greatest diameter. Few scattered hepatic calcifications noted. Hepatopetal portal venous flow.  Scattered ascites.  Loculated collection identified in the left upper mid abdomen, 8.2 x 3.9 x 7.1 cm, fluid simple in character. Adjacent stomach is decompressed. Suspect this represents loculated fluid within the lesser sac. This is confirmed by prior CT.  IMPRESSION: Sludge within  gallbladder.  Enlarged liver containing multiple hepatic metastases.  Scattered ascites with loculated fluid within the lesser sac.   Electronically Signed   By: Lavonia Dana M.D.   On: 01/12/2014 16:49    ASSESSMENT:  #1. Recurrent malignant ascites, stage IV pancreatic cancer with liver metastases, currently taking Xeloda but not with food. #2. Minimal hand-foot syndrome. #3. Status post hepatic abscess drainage December, 2014.   PLAN:  #1. Weekly paracentesis by interventional radiology. #2. Take Xeloda with food during the meal. If heartburn becomes a problem, utilize Maalox plus or Mylanta while continuing on omeprazole. #3. Areta Haber was introduced of leaving the catheter in place and noted to have home health draining fluid periodically and the patient seemed amenable  to that today. #4. Followup in 2 weeks with CBC, chem profile, ferritin.   All questions were answered. The patient knows to call the clinic with any problems, questions or concerns. We can certainly see the patient much sooner if necessary.   I spent 25 minutes counseling the patient face to face. The total time spent in the appointment was 30 minutes.    Doroteo Bradford, MD 01/29/2014 3:46 PM

## 2014-02-02 ENCOUNTER — Emergency Department (HOSPITAL_COMMUNITY)
Admission: EM | Admit: 2014-02-02 | Discharge: 2014-02-25 | Disposition: E | Payer: Medicare Other | Attending: Emergency Medicine | Admitting: Emergency Medicine

## 2014-02-02 ENCOUNTER — Encounter (HOSPITAL_COMMUNITY): Payer: Self-pay | Admitting: Emergency Medicine

## 2014-02-02 ENCOUNTER — Ambulatory Visit (HOSPITAL_COMMUNITY)
Admission: RE | Admit: 2014-02-02 | Discharge: 2014-02-02 | Disposition: A | Discharge status: Home / Self Care | Payer: Medicare Other | Source: Ambulatory Visit | Attending: Oncology | Admitting: Oncology

## 2014-02-02 DIAGNOSIS — C801 Malignant (primary) neoplasm, unspecified: Secondary | ICD-10-CM | POA: Insufficient documentation

## 2014-02-02 DIAGNOSIS — F411 Generalized anxiety disorder: Secondary | ICD-10-CM | POA: Insufficient documentation

## 2014-02-02 DIAGNOSIS — Z794 Long term (current) use of insulin: Secondary | ICD-10-CM | POA: Insufficient documentation

## 2014-02-02 DIAGNOSIS — Z87891 Personal history of nicotine dependence: Secondary | ICD-10-CM | POA: Insufficient documentation

## 2014-02-02 DIAGNOSIS — R0602 Shortness of breath: Secondary | ICD-10-CM | POA: Insufficient documentation

## 2014-02-02 DIAGNOSIS — I10 Essential (primary) hypertension: Secondary | ICD-10-CM | POA: Insufficient documentation

## 2014-02-02 DIAGNOSIS — Z90710 Acquired absence of both cervix and uterus: Secondary | ICD-10-CM | POA: Insufficient documentation

## 2014-02-02 DIAGNOSIS — E119 Type 2 diabetes mellitus without complications: Secondary | ICD-10-CM | POA: Insufficient documentation

## 2014-02-02 DIAGNOSIS — D6481 Anemia due to antineoplastic chemotherapy: Secondary | ICD-10-CM | POA: Insufficient documentation

## 2014-02-02 DIAGNOSIS — M129 Arthropathy, unspecified: Secondary | ICD-10-CM | POA: Insufficient documentation

## 2014-02-02 DIAGNOSIS — Z9889 Other specified postprocedural states: Secondary | ICD-10-CM | POA: Insufficient documentation

## 2014-02-02 DIAGNOSIS — C259 Malignant neoplasm of pancreas, unspecified: Secondary | ICD-10-CM | POA: Insufficient documentation

## 2014-02-02 DIAGNOSIS — R188 Other ascites: Secondary | ICD-10-CM | POA: Insufficient documentation

## 2014-02-02 DIAGNOSIS — T451X5A Adverse effect of antineoplastic and immunosuppressive drugs, initial encounter: Secondary | ICD-10-CM

## 2014-02-02 DIAGNOSIS — Z79899 Other long term (current) drug therapy: Secondary | ICD-10-CM | POA: Insufficient documentation

## 2014-02-03 ENCOUNTER — Other Ambulatory Visit (HOSPITAL_COMMUNITY): Payer: Self-pay

## 2014-02-03 MED FILL — Medication: Qty: 1 | Status: AC

## 2014-02-05 ENCOUNTER — Ambulatory Visit (HOSPITAL_COMMUNITY): Payer: Medicare Other | Admitting: Oncology

## 2014-02-09 ENCOUNTER — Other Ambulatory Visit (HOSPITAL_COMMUNITY): Payer: Medicare Other

## 2014-02-09 ENCOUNTER — Ambulatory Visit (HOSPITAL_COMMUNITY): Payer: Medicare Other

## 2014-02-11 ENCOUNTER — Ambulatory Visit (HOSPITAL_COMMUNITY): Payer: Medicare Other | Admitting: Oncology

## 2014-02-25 NOTE — ED Notes (Signed)
Pt becoming more unresponsive during triage, VS unstable, HR 103, BP 01'T systolic palpated. EDP alerted to pt's declining status. Per Radiology nurse, pt was sitting up talking minutes prior to transfer to ER. Pt exhibiting agonal gasps, no independent purposeful breathing noted.

## 2014-02-25 NOTE — ED Notes (Signed)
Port accesses, bolus of NS started per EDP. EDP spoke with Cancer center, Pt is a DNR per Oncologist. Supportive care initiated.

## 2014-02-25 NOTE — ED Provider Notes (Signed)
CSN: [id number]     Arrival date & time 02/01/2014  1126 History   First MD Initiated Contact with Patient 02/04/2014 1135     Chief Complaint  Patient presents with  . Abdominal Pain     (Consider location/radiation/quality/duration/timing/severity/associated sxs/prior Treatment) Patient is a 68 y.o. female presenting with shortness of breath. The history is provided by a caregiver (the pt was getting a procedure in x-ray and she became sob.  she was not verbal when I saw her). No language interpreter was used.  Shortness of Breath Severity:  Severe Onset quality:  Sudden Timing:  Constant Progression:  Worsening Chronicity:  New   Past Medical History  Diagnosis Date  . Diabetes mellitus without complication   . Hypertension   . Anxiety   . Arthritis   . Pancreatic cancer   . 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 Peripheral neuropathy 10/13/2013  . Liver abscess 11/04/2013   Past Surgical History  Procedure Laterality Date  . Abdominal hysterectomy    . Colonoscopy  2006    Dr. Gala Romney, polyp, benign  . Ercp  05/10/2013    Procedure: ENDOSCOPIC RETROGRADE CHOLANGIOPANCREATOGRAPHY (ERCP)(DIFFICULT CANNULATION) ;  Surgeon: Rogene Houston, MD;  Location: [facility];  Service: Endoscopy;;  . Biliary stent placement  05/10/2013    Procedure: BILIARY STENT PLACEMENT (10MM X 80MM WALLFLEX STENT);  Surgeon: Rogene Houston, MD;  Location: [facility];  Service: Endoscopy;;  . Sphincterotomy  05/10/2013    Procedure: SPHINCTEROTOMY (PRECUT MADE WITH NEEDLE KNIFE);  Surgeon: Rogene Houston, MD;  Location: [facility];  Service: Endoscopy;;  . Liver biopsy  05/12/2013  . Portacath placement Left 05/23/2013    Procedure: INSERTION PORT-A-CATH;  Surgeon: Donato Heinz, MD;  Location: [facility];  Service: General;  Laterality: Left;  . Paracentesis Right 01/26/14   Family History  Problem Relation Age of Onset  . Colon cancer Neg Hx   . Liver disease Neg Hx   . Bone cancer Brother    . Cancer Brother   . Hypertension Mother    History  Substance Use Topics  . Smoking status: Former Smoker -- 1.00 packs/day for 20 years  . Smokeless tobacco: Former Systems developer    Quit date: 03/27/2013  . Alcohol Use: No   OB History   Grav Para Term Preterm Abortions TAB SAB Ect Mult Living   5 5 5       5      Review of Systems  Unable to perform ROS: Severe respiratory distress  Respiratory: Positive for shortness of breath.       Allergies  Oxycodone hcl and Levaquin  Home Medications   Current Outpatient Rx  Name  Route  Sig  Dispense  Refill  . acetaminophen (TYLENOL) 325 MG tablet   Oral   Take 2 tablets (650 mg total) by mouth every 6 (six) hours as needed for mild pain (or Fever >/= 101).         Marland Kitchen ALPRAZolam (XANAX) 1 MG tablet   Oral   Take 1 mg by mouth 3 (three) times daily as needed for sleep or anxiety.         Marland Kitchen amLODipine (NORVASC) 5 MG tablet   Oral   Take 5 mg by mouth daily.         . capecitabine (XELODA) 500 MG tablet   Oral   Take 1,000 mg by mouth 2 (two) times daily. To take for 7 days, then stop for 7 days         .  cephALEXin (KEFLEX) 500 MG capsule      2 caps po bid x 7 days   28 capsule   0   . furosemide (LASIX) 40 MG tablet   Oral   Take 40 mg by mouth daily.         Marland Kitchen HYDROcodone-acetaminophen (NORCO) 10-325 MG per tablet   Oral   Take 1 tablet by mouth every 6 (six) hours as needed for moderate pain.         Marland Kitchen insulin glargine (LANTUS) 100 UNIT/ML injection   Subcutaneous   Inject 0.05 mLs (5 Units total) into the skin at bedtime.   10 mL   12   . insulin glargine (LANTUS) 100 UNIT/ML injection   Subcutaneous   Inject 20 Units into the skin at bedtime as needed (patient only takes if blood sugar levels are over 100.).         Marland Kitchen losartan (COZAAR) 100 MG tablet   Oral   Take 100 mg by mouth daily.         Marland Kitchen morphine (MSIR) 15 MG tablet   Oral   Take 1-2 tablets (15-30 mg total) by mouth every 4  (four) hours as needed for severe pain.   60 tablet   0   . omeprazole (PRILOSEC) 20 MG capsule   Oral   Take 1 capsule (20 mg total) by mouth daily.   30 capsule   3   . potassium chloride SA (K-DUR,KLOR-CON) 20 MEQ tablet   Oral   Take 20 mEq by mouth daily.          BP 58/31  Pulse 53  Resp 6 Physical Exam  Constitutional:  cacetic  HENT:  Head: Normocephalic.  Eyes: Conjunctivae and EOM are normal. No scleral icterus.  Neck: Neck supple. No thyromegaly present.  Cardiovascular: Regular rhythm.   Pulmonary/Chest: No stridor. She has no wheezes.  Pt breathing onl 2-3 times a minute  Abdominal: She exhibits no distension. There is no tenderness. There is no rebound.  Musculoskeletal: Normal range of motion. She exhibits no edema.  Lymphadenopathy:    She has no cervical adenopathy.  Neurological: She exhibits normal muscle tone. Coordination normal.  Pt not responding to painful or verbal stimuli  Skin: No rash noted. No erythema.  Psychiatric: She has a normal mood and affect. Her behavior is normal.    ED Course  Procedures (including critical care time) Labs Review Labs Reviewed - No data to display Imaging Review No results found.   EKG Interpretation None      MDM   Final diagnoses:  Pancreatic cancer    I spoke with Dr. Barnet Glasgow and he stated that the pt had metastatic ca and she was a DNR.  The pt died at 12:54pm.    Dr.  Karie Kirks will sign the death certificate    Maudry Diego, MD 02-04-14 [phone]

## 2014-02-25 NOTE — Progress Notes (Signed)
Met patient [name]) in registration c/o severe abdominal pain. Cancer center MD advised and stated to continue with paracentesis. Taken to [place] for paracentesis and placed on stretcher. Dr. Thornton Papas advised of condition patient alert and oriented. Unable to obtain BP patient radial pulse was very weak. Unable to preform paracentesis due to lack of ascites. Patient asked if she wanted to go to the ER and stated yes. Taken to the ER room 5 and transferred to ER stretcher and placed on ECG monitor showing 115. After moving patient to ER stretcher patient became unresponsive with agonal respirations. ECG rate dropped to 40 and patient was bagged by myself. Care turned over to ER staff Marya Amsler RN.

## 2014-02-25 NOTE — ED Notes (Addendum)
EDP spoke with pt oncologist-pt has DNR. EDP spoke with family, family at bedside with pt. Comfort Care only.

## 2014-02-25 NOTE — ED Notes (Addendum)
Family remains at bedside. Agonal respirations 6-10 per minute. Pt on 15L NRB. Hr-60. Unable to obtain bp. Fluid bolus in progress.

## 2014-02-25 NOTE — ED Notes (Signed)
Hr-65, shallow respiratory effort with apnea. Unable to obtain bp. RN and Family at bedside. Chaplain on call paged.

## 2014-02-25 NOTE — ED Notes (Signed)
Family and Chaplain at bedside 

## 2014-02-25 NOTE — ED Notes (Signed)
Time of Death 1254.

## 2014-02-25 NOTE — ED Notes (Addendum)
Shallow and agonal respirations, periods of apnea 20-30 seconds. Hr-78. Unable to palpate bp. EDP notified. Family at bedside.

## 2014-02-25 NOTE — ED Notes (Signed)
Asystole noted. Family at bedside. EDP notified.

## 2014-02-25 NOTE — ED Notes (Signed)
Pt in x-ray to have fluid removed from abdomen. Per daughter there was not any fluid to be removed from abdomen. States pt has been dizzy with abdominal pain for a couple of days

## 2014-02-25 NOTE — ED Notes (Signed)
Prior note from this nurse. Pt breathing is supported at this time with a bag valve mask. HR continues to drop, 50's.

## 2014-02-25 NOTE — ED Notes (Signed)
Apnea for several minutes. Unable to obtain bp. Hr-52 per monitor. EDP notified.

## 2014-02-25 NOTE — ED Notes (Addendum)
1300-IV and monitor removed with family at bedside per request. Death report information obtained from daughter. AC notified. 1303-Donor services contacted by Peyton Najjar RN--referral # [phone].

## 2014-02-25 DEATH — deceased

## 2014-03-09 ENCOUNTER — Other Ambulatory Visit (HOSPITAL_COMMUNITY): Payer: Medicare Other

## 2014-04-06 ENCOUNTER — Other Ambulatory Visit (HOSPITAL_COMMUNITY): Payer: Medicare Other

## 2014-07-24 ENCOUNTER — Other Ambulatory Visit: Payer: Self-pay | Admitting: *Deleted

## 2014-08-11 IMAGING — CT CT CHEST W/ CM
2 of 3 series · 15 of 36 positions shown, 18 images · IV contrast (omnipaque)
Comparison: CT abdomen pelvis 05/07/2013.

CLINICAL DATA: Suspected pancreatic cancer with  metastatic
disease.

CT CHEST WITH CONTRAST
TECHNIQUE: Multidetector CT imaging of the chest was performed
following the standard protocol during bolus administration of
intravenous contrast.
Contrast: 80mL OMNIPAQUE IOHEXOL 300 MG/ML  SOLN

[Series 2: chestroutine 5.0 b40f · axial · 0.63mm/px · z∈[-243,+27]mm · 12 of 64 slices shown, 15 images]
[im 5/64  mediastinal]
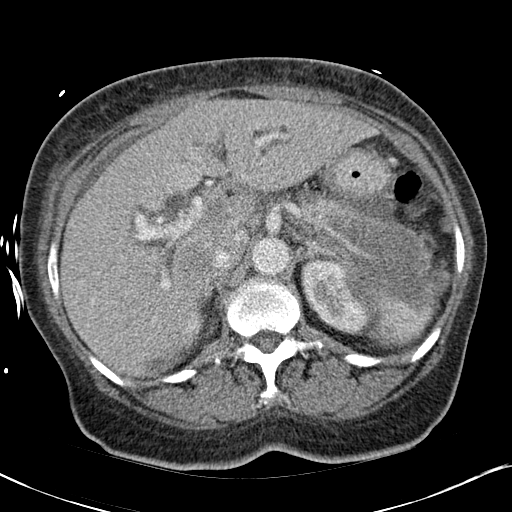
[im 5/64  lung]
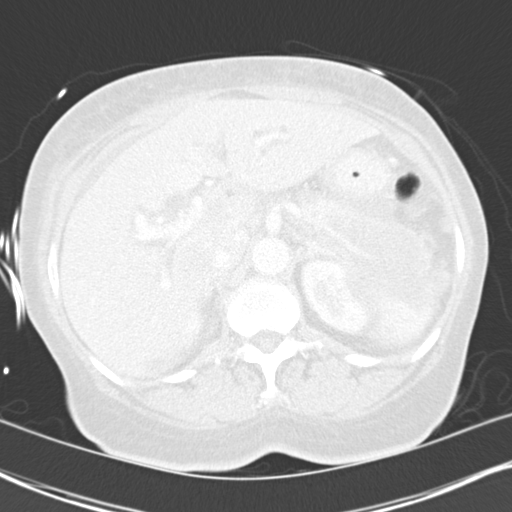
[im 10/64  lung]
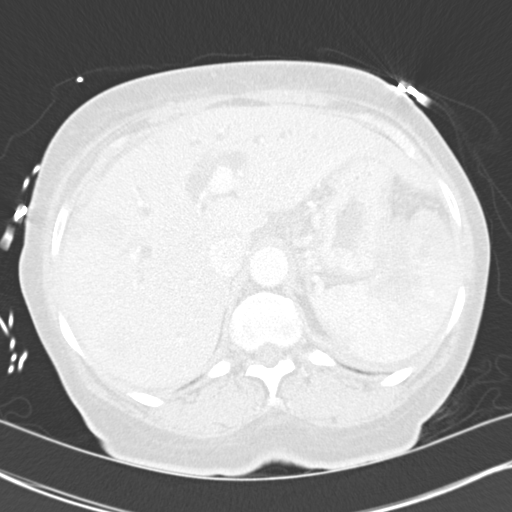
[im 15/64  lung]
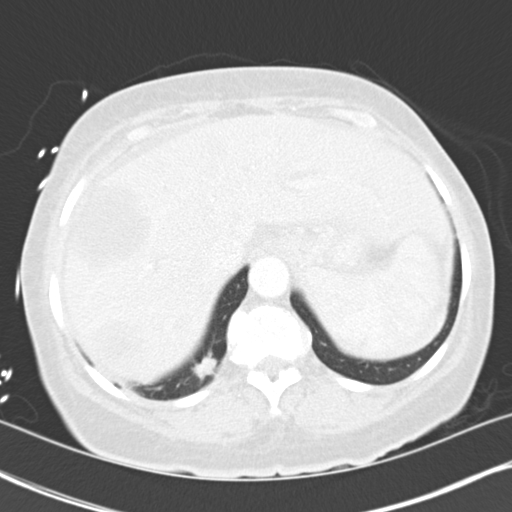
[im 19/64  lung]
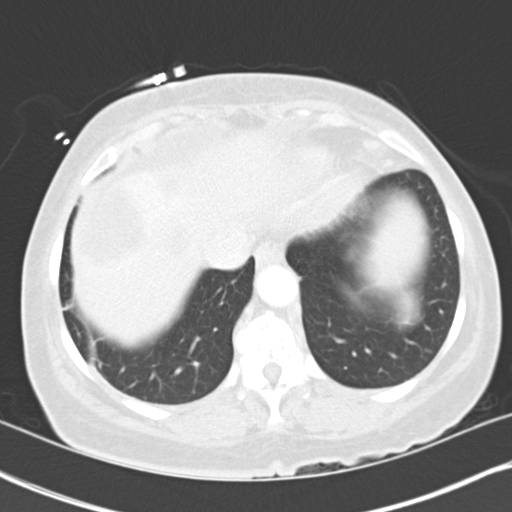
[im 24/64  mediastinal]
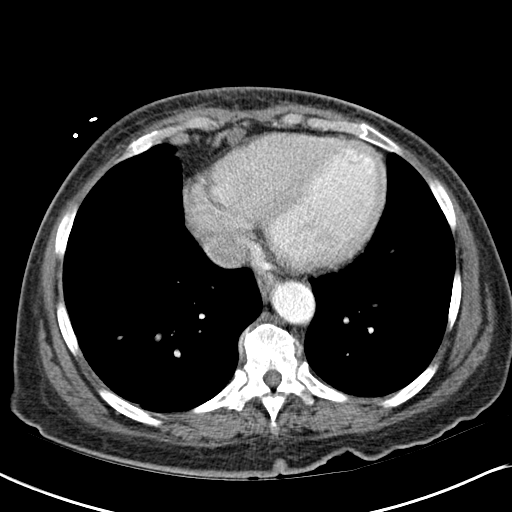
[im 24/64  lung]
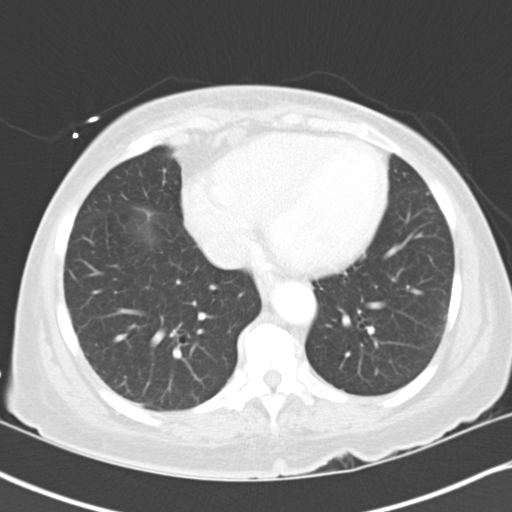
[im 29/64  lung]
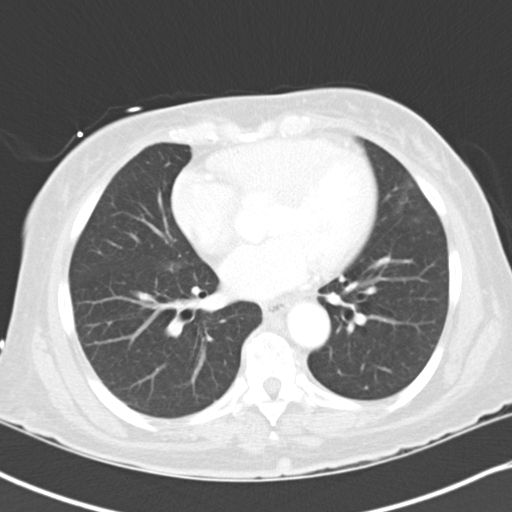
[im 36/64  lung]
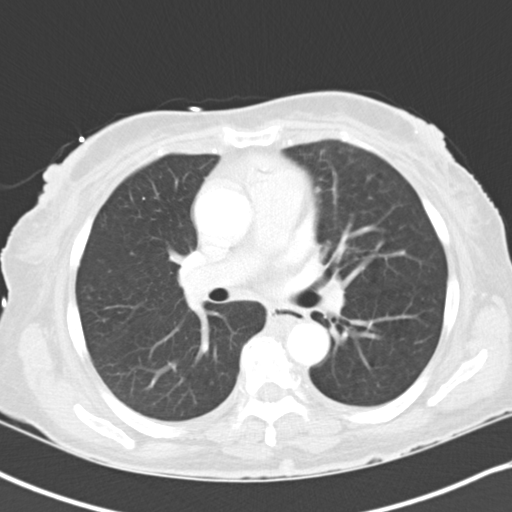
[im 40/64  lung]
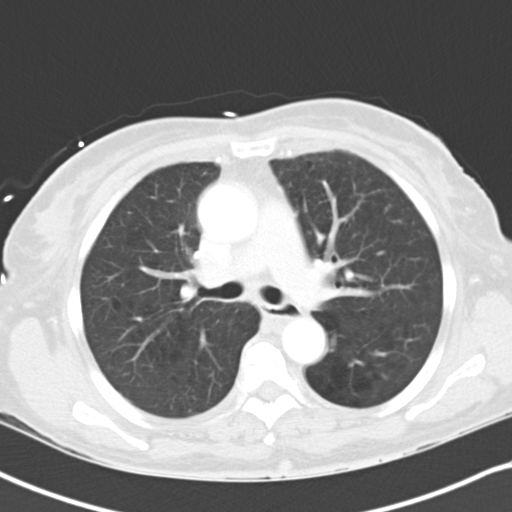
[im 45/64  mediastinal]
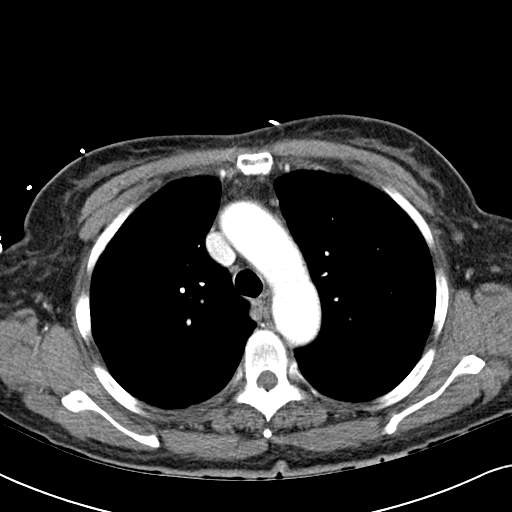
[im 45/64  lung]
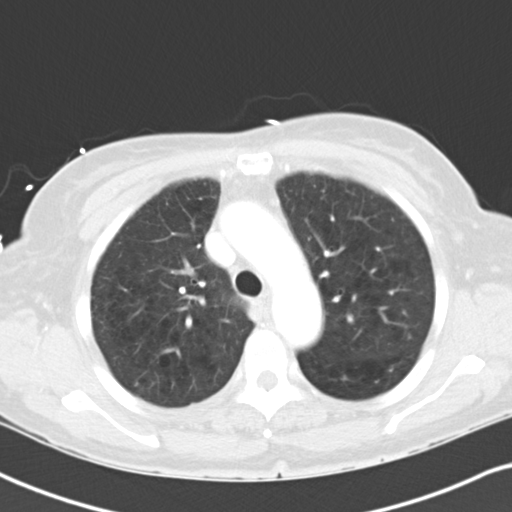
[im 50/64  lung]
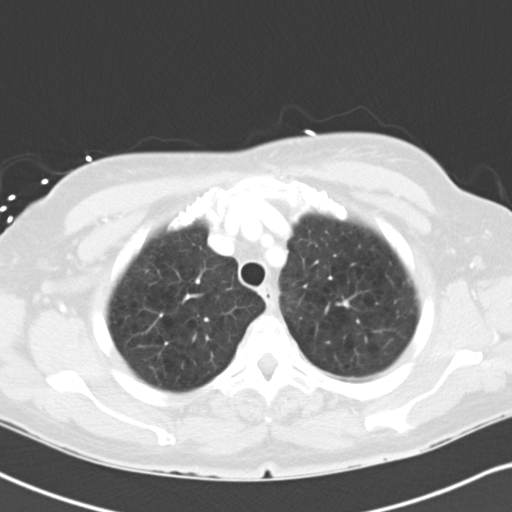
[im 54/64  lung]
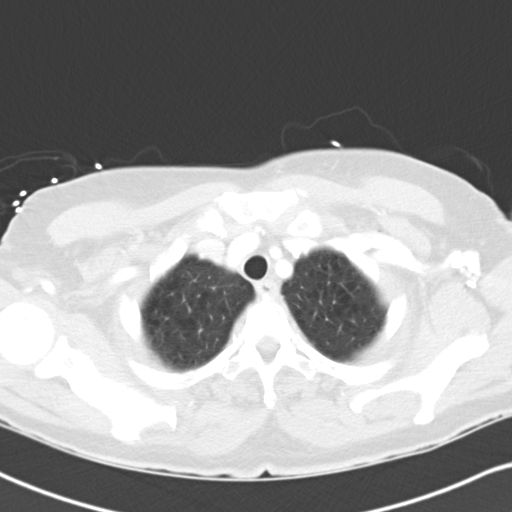
[im 59/64  lung]
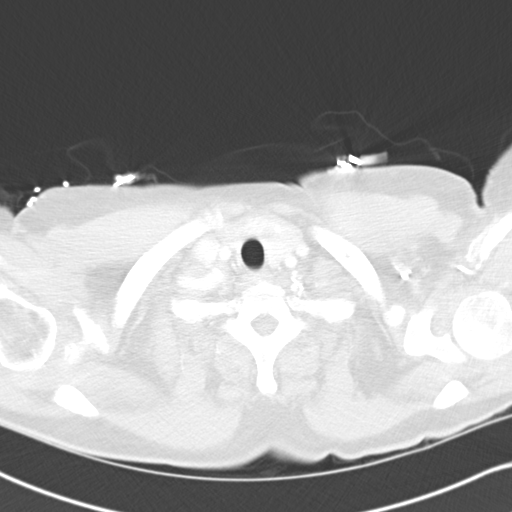

[Series 4: mpr coronal chest 3mm · coronal · 0.61mm/px · 3 of 87 slices shown]
[im 18/87  lung]
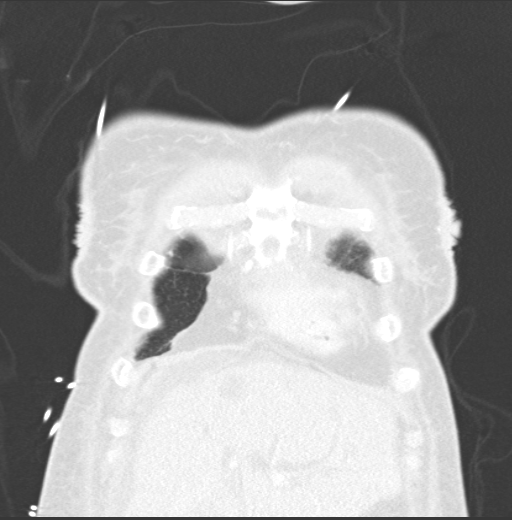
[im 35/87  lung]
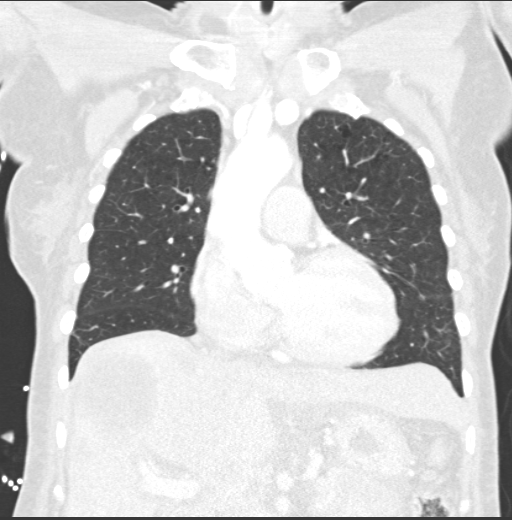
[im 52/87  lung]
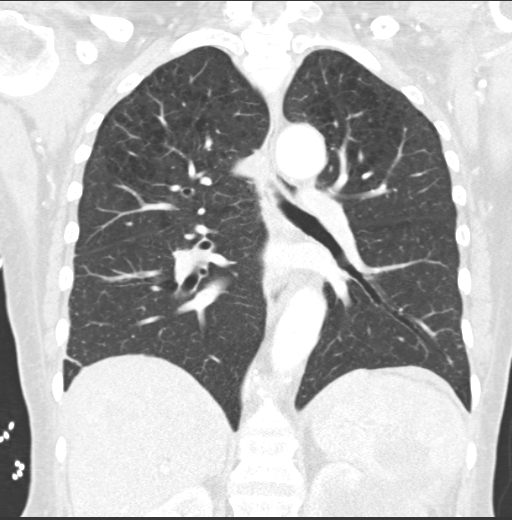

[15 of 36 positions shown; findings below may reference images not displayed]

FINDINGS: Single pulmonary metastasis posterior segment right lobe
10 x 14 mm.  No pleural or pericardial effusions. Small,
subpleural, likely post inflammatory nodules left upper lobe (image
23) and lingula (image 34).  Normal cardiac size.  No significant
coronary artery calcification.  No osseous lesions.  No hilar or
mediastinal adenopathy.  Trachea and proximal bronchi unremarkable.
Extrathoracic soft tissues unremarkable.  Normal appearing
transverse arch and great vessel origins.  No thoracic aortic
aneurysm.

See  the prior report from 05/07/2013 for additional
infradiaphragmatic findings.
IMPRESSION: Apparent single pulmonary metastasis posterior segment right lower
lobe.

## 2014-08-12 IMAGING — CR DG ABDOMEN 1V
2 series · 3 of 3 positions shown · non-contrast
Comparison: None.

***ADDENDUM*** CREATED: 05/10/2013 [DATE]

Additional two views submitted to exclude  intrahepatic biliary
air.  Biliary stent again noted.  There is no suggestion of
intrahepatic biliary air.
***END ADDENDUM*** SIGNED BY: Immacula Hartung, M.D.
CLINICAL DATA: Post biliary stent placement
ABDOMEN - 1 VIEW

[Series 4001: view not recorded · 0.20mm/px · 2 of 2 slices shown (1 of 2)]
[im 1/2]
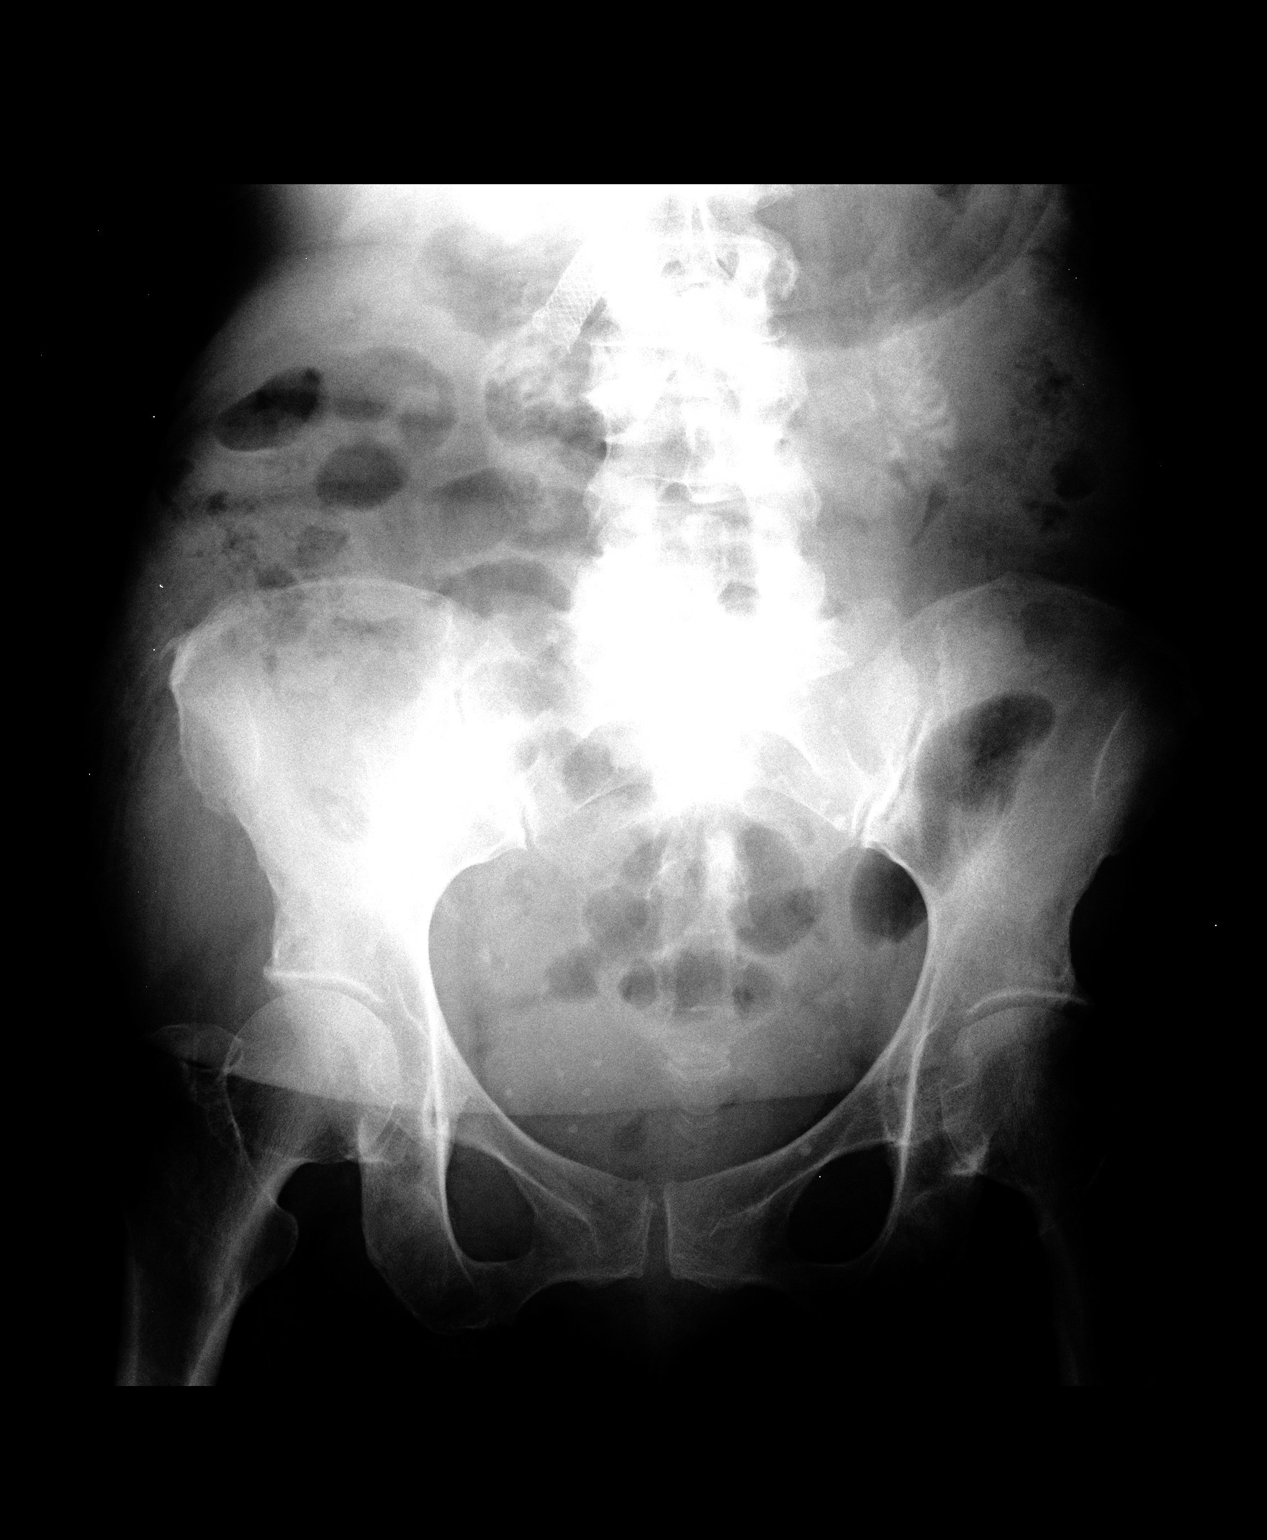
[im 2/2]
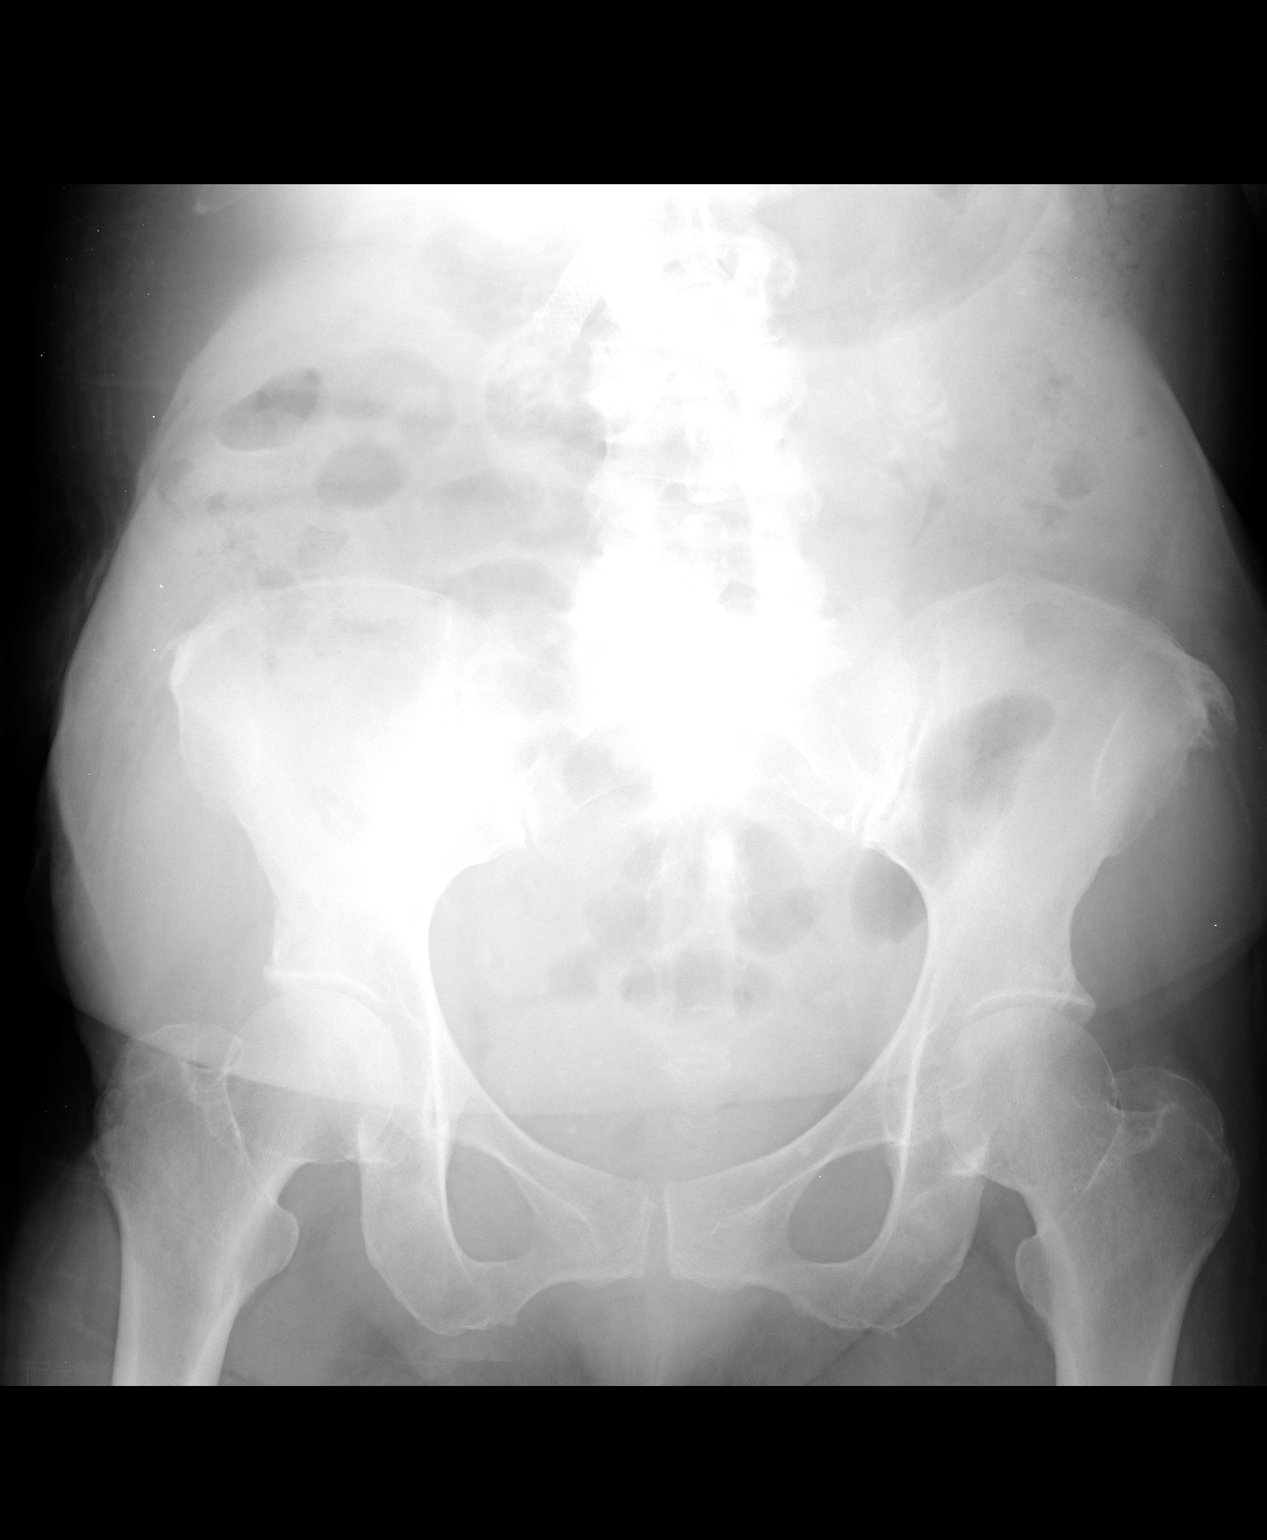

[view not recorded (2 of 2)]
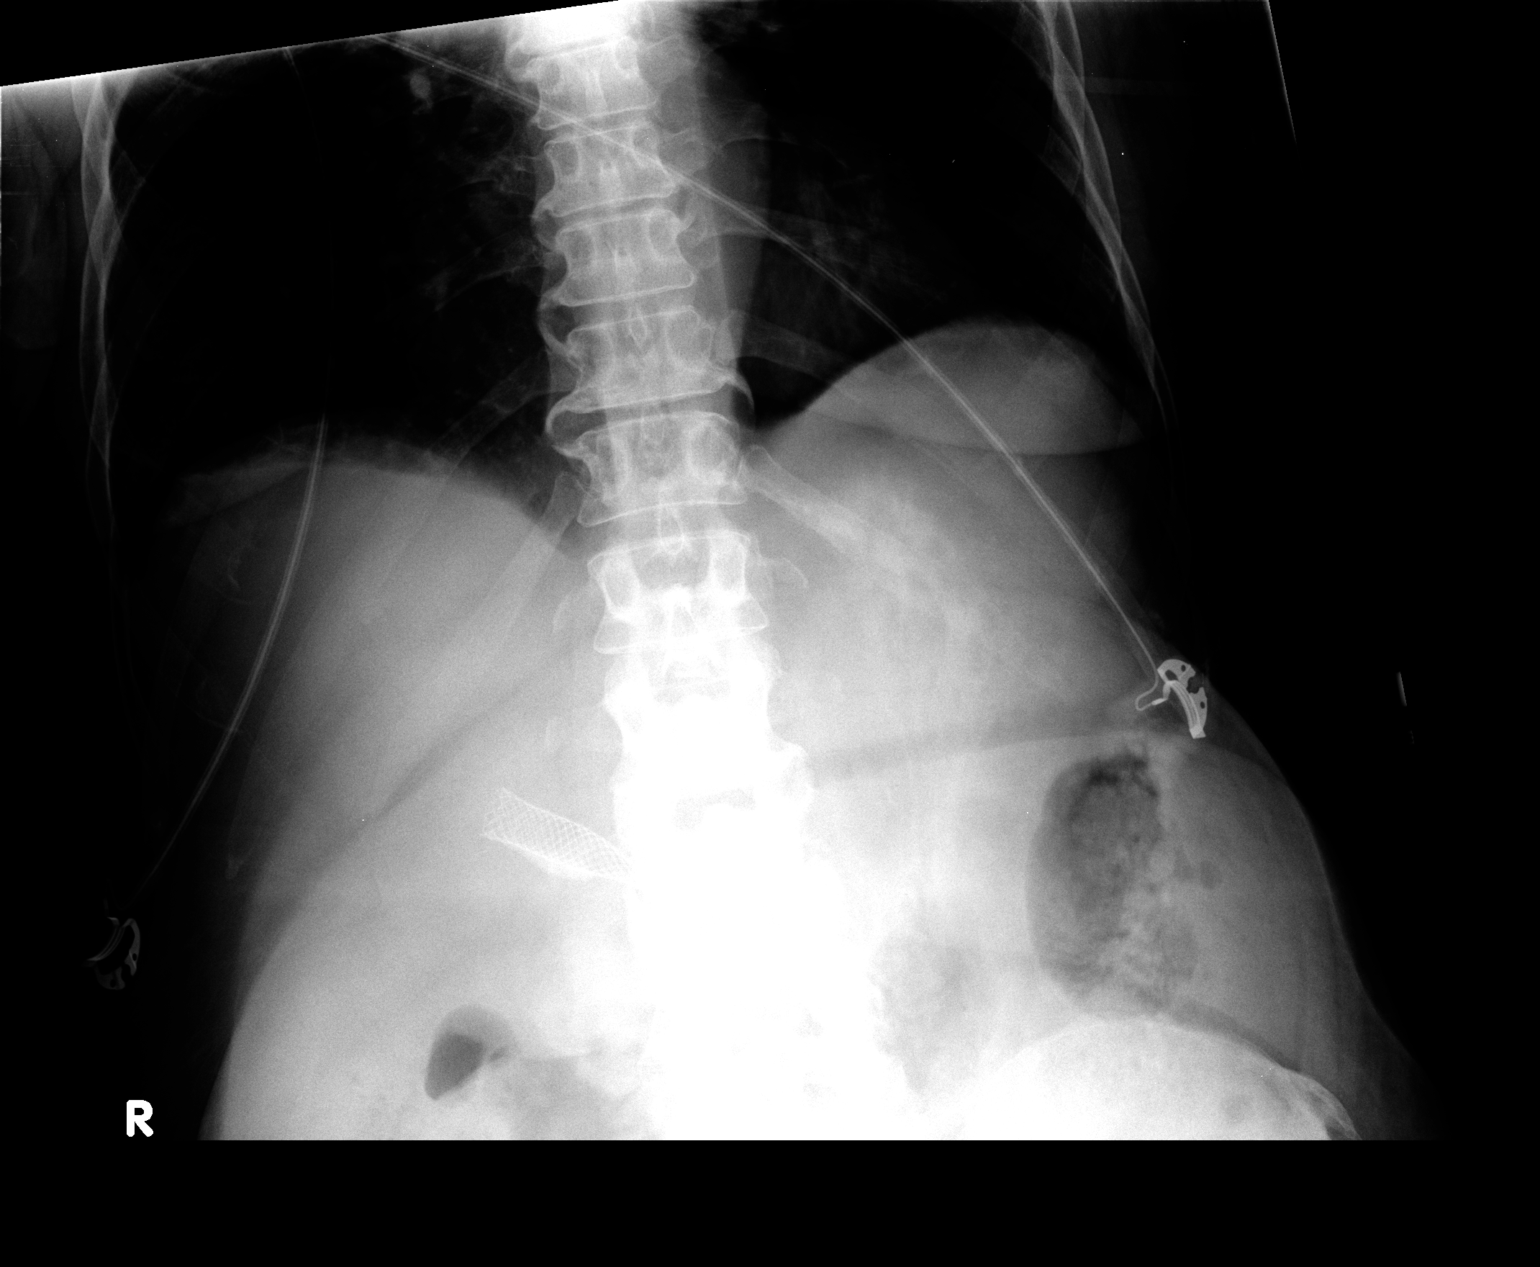

[3 of 3 positions shown; findings below may reference images not displayed]

FINDINGS: Mild gaseous distended bowel loops in mid abdomen
probable mild ileus.  Biliary stent is noted in the right upper
quadrant.
IMPRESSION: Mild gaseous distended bowel loops in mid abdomen probable mild
ileus.  Biliary stent is noted in the right upper quadrant.]

## 2014-09-28 ENCOUNTER — Encounter (HOSPITAL_COMMUNITY): Payer: Self-pay | Admitting: Emergency Medicine

## 2014-10-29 IMAGING — CR DG CHEST 2V
2 series · 2 of 2 positions shown · non-contrast
Comparison: Pleural chest radiograph 05/23/2013

CLINICAL DATA: Weakness and fever.  Recent chemotherapy.
Pancreatic cancer.

CHEST - 2 VIEW

[view not recorded (1 of 2)]
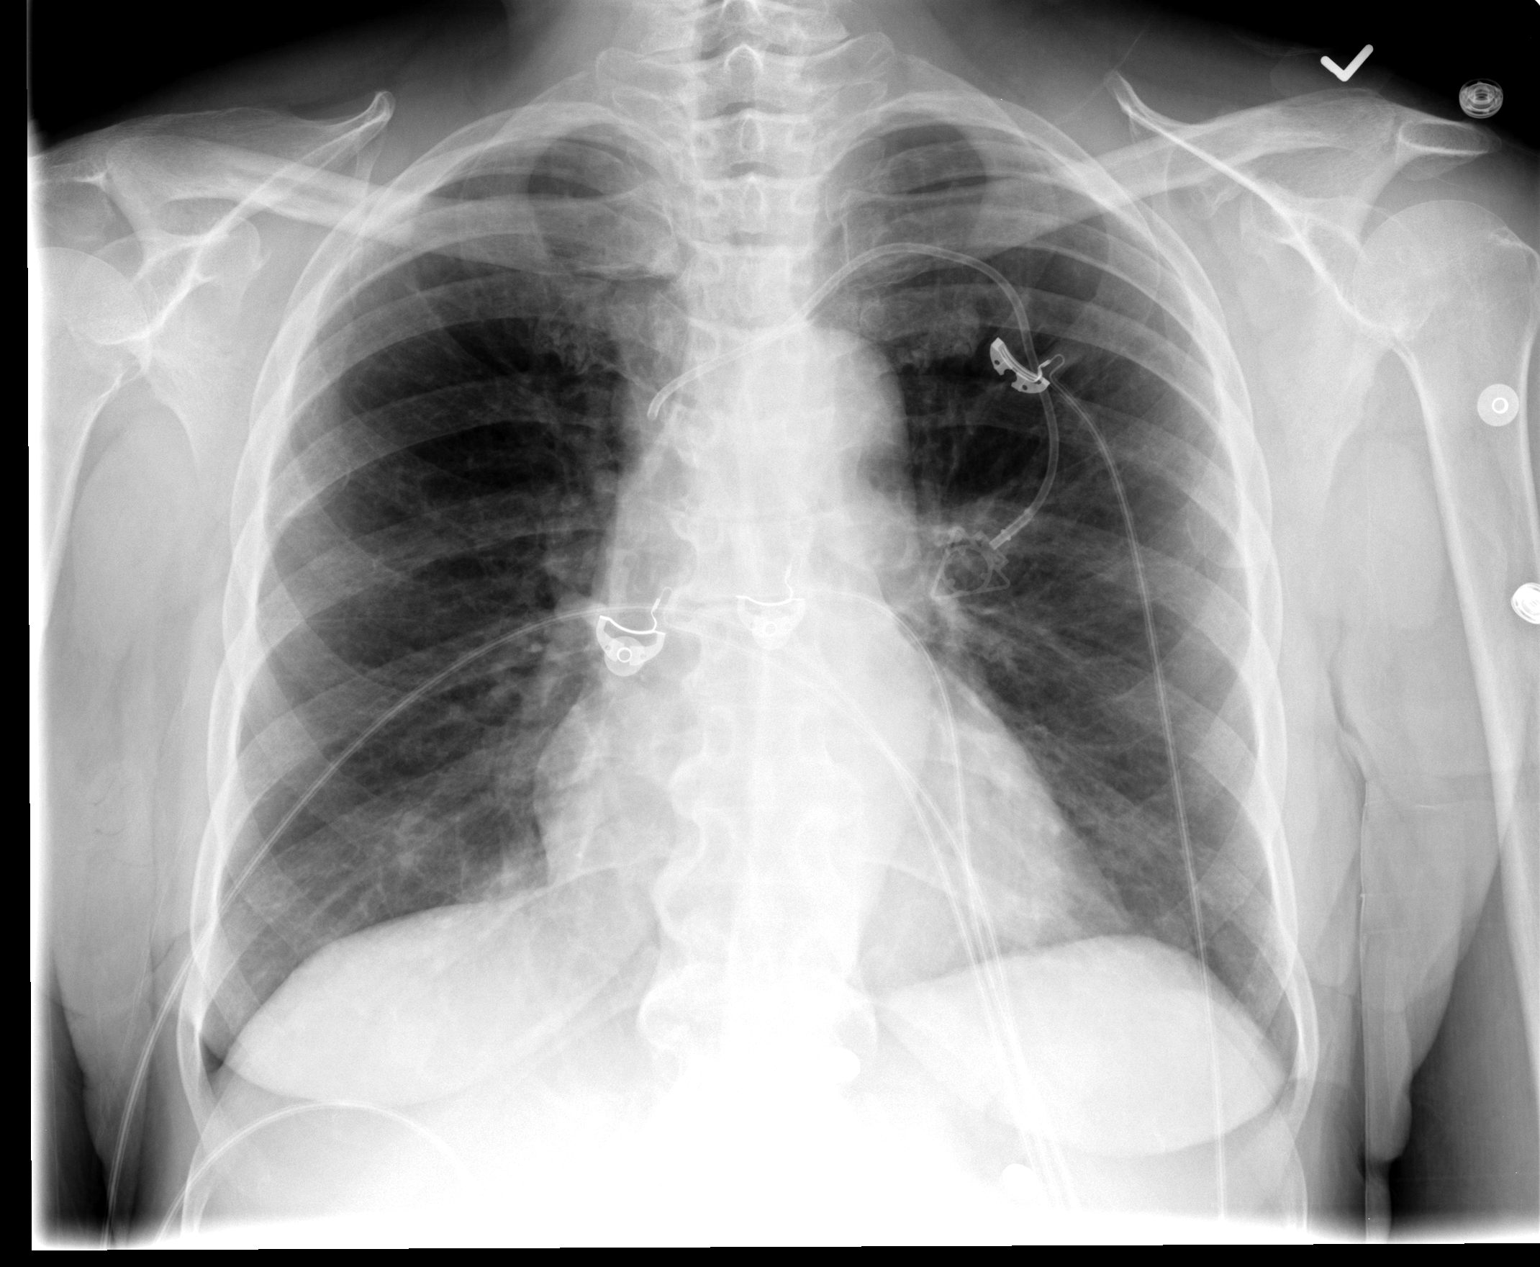

[view not recorded (2 of 2)]
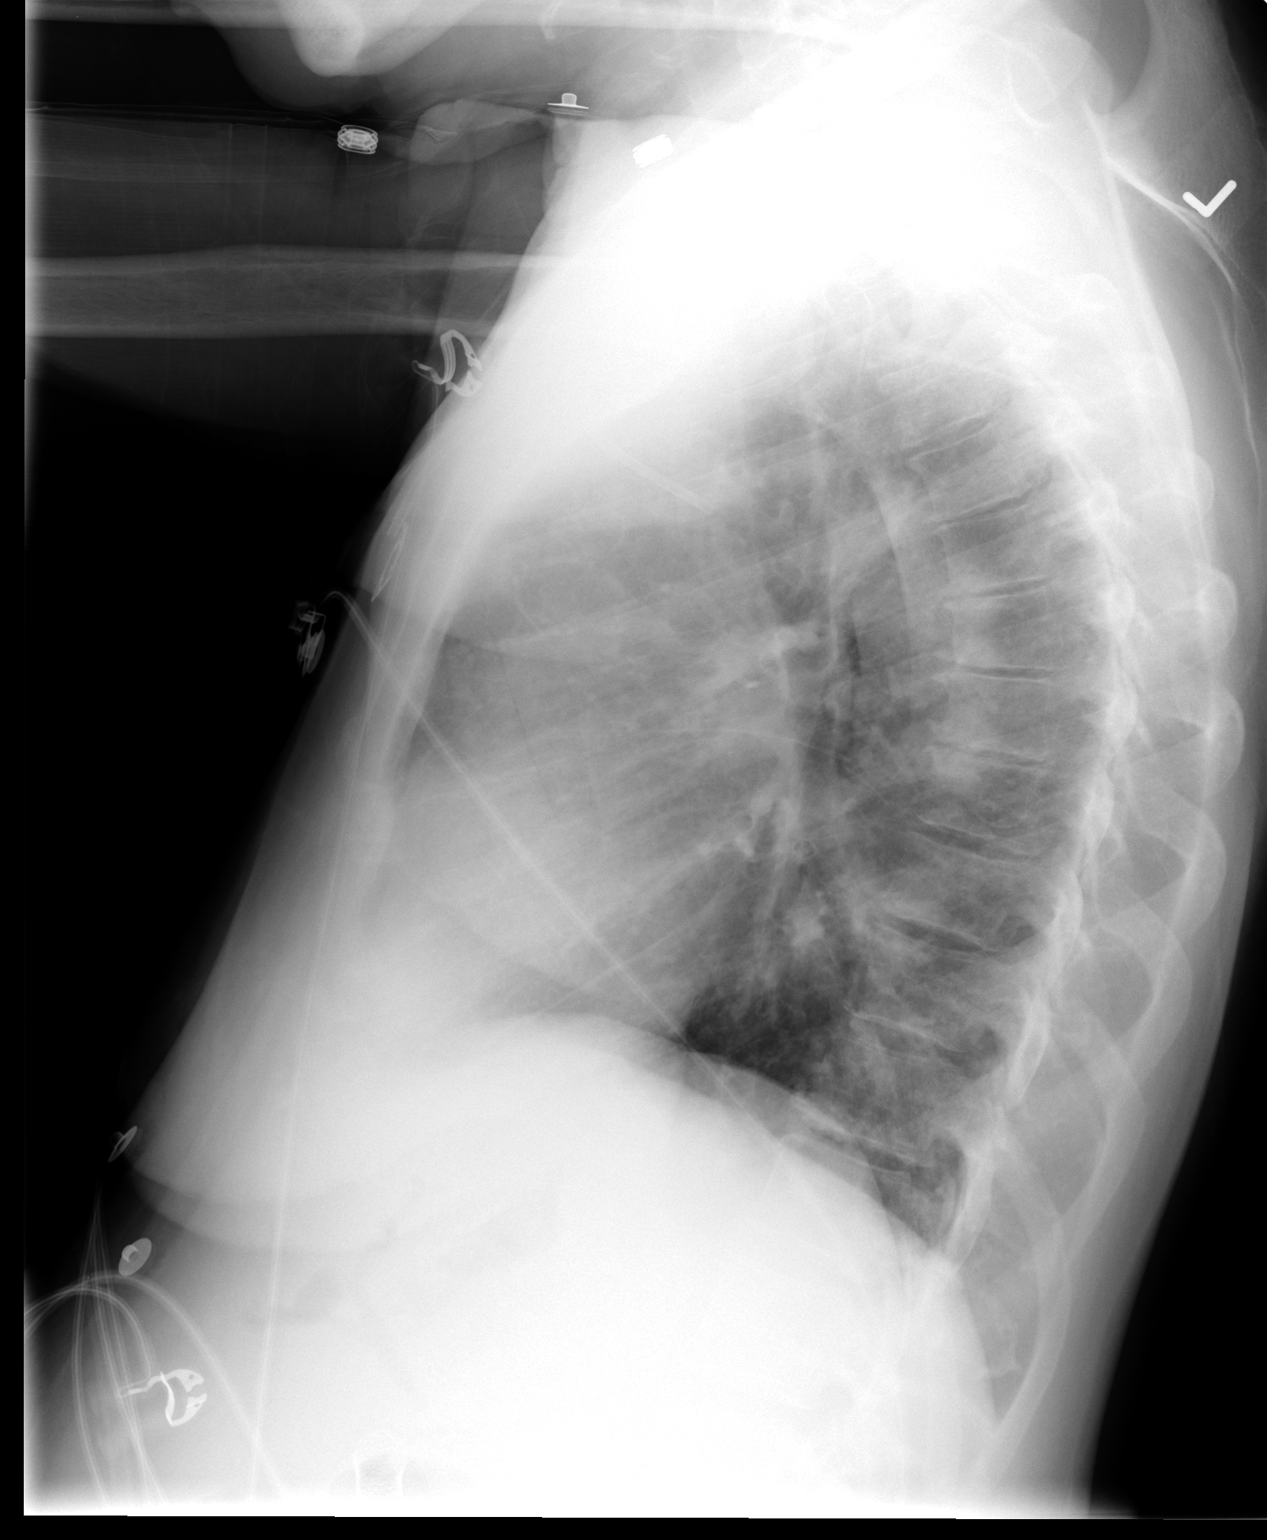

[2 of 2 positions shown; findings below may reference images not displayed]

FINDINGS: Heart size is upper normal.  Mediastinal and hilar
contours are stable.  Left subclavian power Port-A-Cath is present.
Since the chest radiograph of 05/23/2013, the catheter appears to
have been slightly retracted, and now curves posteriorly, as best
seen on the lateral view, with the distal catheter in the expected
location of the azygos vein.

The lungs are normally expanded and clear.  No focal airspace
opacity, mass, or pulmonary edema.  Negative for pleural effusion
or pneumothorax.  No acute osseous abnormality.
IMPRESSION: 1. The distal tip of the left subclavian Port-A-Cath has changed in
position since prior chest radiograph and is in the expected
location of the azygos vein.  This finding was called to Dr. Karl-Ayden
[DATE] p.m. 07/27/2013.
2.  No acute cardiopulmonary disease identified.

## 2015-07-21 LAB — BLOOD GAS, ARTERIAL
Acid-Base Excess: 2.8 mmol/L — ABNORMAL HIGH (ref 0.0–2.0)
Bicarbonate: 26.6 mEq/L — ABNORMAL HIGH (ref 20.0–24.0)
Drawn by: 22223
FIO2: 21
O2 Saturation: 96.2 %
Patient temperature: 37
TCO2: 24.2 mmol/L (ref 0–100)
pCO2 arterial: 39.1 mmHg (ref 35.0–45.0)
pH, Arterial: 7.447 (ref 7.350–7.450)
pO2, Arterial: 84.6 mmHg (ref 80.0–100.0)
